# Patient Record
Sex: Female | Born: 1960 | Race: White | Hispanic: No | Smoking: Never smoker
Health system: Southern US, Community
[De-identification: ages and names within clinical notes are randomized; demographics above are authoritative.]

## PROBLEM LIST (undated history)

## (undated) DIAGNOSIS — I10 Essential (primary) hypertension: Secondary | ICD-10-CM

## (undated) DIAGNOSIS — R600 Localized edema: Secondary | ICD-10-CM

## (undated) DIAGNOSIS — E785 Hyperlipidemia, unspecified: Secondary | ICD-10-CM

## (undated) DIAGNOSIS — E119 Type 2 diabetes mellitus without complications: Secondary | ICD-10-CM

## (undated) DIAGNOSIS — M1990 Unspecified osteoarthritis, unspecified site: Secondary | ICD-10-CM

## (undated) DIAGNOSIS — S36039A Unspecified laceration of spleen, initial encounter: Secondary | ICD-10-CM

## (undated) DIAGNOSIS — E039 Hypothyroidism, unspecified: Secondary | ICD-10-CM

## (undated) DIAGNOSIS — E8941 Symptomatic postprocedural ovarian failure: Secondary | ICD-10-CM

## (undated) DIAGNOSIS — K625 Hemorrhage of anus and rectum: Secondary | ICD-10-CM

## (undated) DIAGNOSIS — Z6836 Body mass index (BMI) 36.0-36.9, adult: Secondary | ICD-10-CM

## (undated) DIAGNOSIS — K760 Fatty (change of) liver, not elsewhere classified: Secondary | ICD-10-CM

## (undated) DIAGNOSIS — F419 Anxiety disorder, unspecified: Secondary | ICD-10-CM

## (undated) HISTORY — DX: Hyperlipidemia, unspecified: E78.5

## (undated) HISTORY — DX: Body mass index (bmi) 36.0-36.9, adult: Z68.36

## (undated) HISTORY — DX: Localized edema: R60.0

## (undated) HISTORY — DX: Fatty (change of) liver, not elsewhere classified: K76.0

## (undated) HISTORY — DX: Type 2 diabetes mellitus without complications: E11.9

## (undated) HISTORY — DX: Unspecified osteoarthritis, unspecified site: M19.90

## (undated) HISTORY — DX: Morbid (severe) obesity due to excess calories: E66.01

## (undated) HISTORY — DX: Symptomatic postprocedural ovarian failure: E89.41

## (undated) HISTORY — DX: Essential (primary) hypertension: I10

## (undated) HISTORY — PX: CERVICAL SPINE SURGERY: SHX589

## (undated) HISTORY — DX: Anxiety disorder, unspecified: F41.9

## (undated) HISTORY — PX: CHOLECYSTECTOMY: SHX55

## (undated) HISTORY — DX: Unspecified laceration of spleen, initial encounter: S36.039A

## (undated) HISTORY — DX: Hemorrhage of anus and rectum: K62.5

## (undated) HISTORY — PX: JOINT REPLACEMENT: SHX530

---

## 1998-07-01 ENCOUNTER — Other Ambulatory Visit: Admission: RE | Admit: 1998-07-01 | Discharge: 1998-07-01 | Payer: Self-pay | Admitting: Family Medicine

## 2000-04-23 ENCOUNTER — Other Ambulatory Visit: Admission: RE | Admit: 2000-04-23 | Discharge: 2000-04-23 | Payer: Self-pay | Admitting: Obstetrics and Gynecology

## 2002-09-21 ENCOUNTER — Encounter: Payer: Self-pay | Admitting: Obstetrics and Gynecology

## 2002-09-21 ENCOUNTER — Ambulatory Visit (HOSPITAL_COMMUNITY): Admission: RE | Admit: 2002-09-21 | Discharge: 2002-09-21 | Payer: Self-pay | Admitting: Obstetrics and Gynecology

## 2004-04-24 ENCOUNTER — Ambulatory Visit: Payer: Self-pay | Admitting: Family Medicine

## 2004-08-21 ENCOUNTER — Ambulatory Visit: Payer: Self-pay | Admitting: Family Medicine

## 2005-01-02 ENCOUNTER — Ambulatory Visit: Payer: Self-pay | Admitting: Family Medicine

## 2005-01-23 ENCOUNTER — Ambulatory Visit: Payer: Self-pay | Admitting: Family Medicine

## 2005-08-16 ENCOUNTER — Ambulatory Visit: Payer: Self-pay | Admitting: Family Medicine

## 2005-08-22 ENCOUNTER — Ambulatory Visit: Payer: Self-pay | Admitting: Family Medicine

## 2005-11-26 ENCOUNTER — Ambulatory Visit: Payer: Self-pay | Admitting: Family Medicine

## 2006-04-09 ENCOUNTER — Ambulatory Visit: Payer: Self-pay | Admitting: Family Medicine

## 2006-04-09 LAB — CONVERTED CEMR LAB
ALT: 11 units/L (ref 0–40)
AST: 19 units/L (ref 0–37)
Albumin: 3.8 g/dL (ref 3.5–5.2)
Alkaline Phosphatase: 36 units/L — ABNORMAL LOW (ref 39–117)
BUN: 13 mg/dL (ref 6–23)
Basophils Absolute: 0.1 10*3/uL (ref 0.0–0.1)
Basophils Relative: 1.6 % — ABNORMAL HIGH (ref 0.0–1.0)
CO2: 29 meq/L (ref 19–32)
Calcium: 9.2 mg/dL (ref 8.4–10.5)
Chloride: 102 meq/L (ref 96–112)
Chol/HDL Ratio, serum: 9
Cholesterol: 265 mg/dL (ref 0–200)
Creatinine, Ser: 0.9 mg/dL (ref 0.4–1.2)
Eosinophil percent: 3.9 % (ref 0.0–5.0)
GFR calc non Af Amer: 72 mL/min
Glomerular Filtration Rate, Af Am: 87 mL/min/{1.73_m2}
Glucose, Bld: 87 mg/dL (ref 70–99)
HCT: 38.1 % (ref 36.0–46.0)
HDL: 29.3 mg/dL — ABNORMAL LOW (ref >39.0)
Hemoglobin: 12.5 g/dL (ref 12.0–15.0)
LDL DIRECT: 174.8 mg/dL
Lymphocytes Relative: 21.8 % (ref 12.0–46.0)
MCHC: 32.8 g/dL (ref 30.0–36.0)
MCV: 86.8 fL (ref 78.0–100.0)
Monocytes Absolute: 0.4 10*3/uL (ref 0.2–0.7)
Monocytes Relative: 5.8 % (ref 3.0–11.0)
Neutro Abs: 4.9 10*3/uL (ref 1.4–7.7)
Neutrophils Relative %: 66.9 % (ref 43.0–77.0)
Platelets: 258 10*3/uL (ref 150–400)
Potassium: 3.8 meq/L (ref 3.5–5.1)
RBC: 4.39 M/uL (ref 3.87–5.11)
RDW: 16.4 % — ABNORMAL HIGH (ref 11.5–14.6)
Sodium: 137 meq/L (ref 135–145)
Total Bilirubin: 0.9 mg/dL (ref 0.3–1.2)
Total Protein: 6.7 g/dL (ref 6.0–8.3)
Triglyceride fasting, serum: 224 mg/dL (ref 0–149)
VLDL: 45 mg/dL — ABNORMAL HIGH (ref 0–40)
WBC: 7.3 10*3/uL (ref 4.5–10.5)

## 2006-05-28 HISTORY — PX: ABDOMINAL HYSTERECTOMY: SHX81

## 2006-05-28 HISTORY — PX: OOPHORECTOMY: SHX86

## 2006-06-11 ENCOUNTER — Ambulatory Visit: Payer: Self-pay | Admitting: Family Medicine

## 2006-06-11 LAB — CONVERTED CEMR LAB
ALT: 11 units/L (ref 0–40)
AST: 20 units/L (ref 0–37)
Albumin: 4.3 g/dL (ref 3.5–5.2)
Alkaline Phosphatase: 41 units/L (ref 39–117)
Amylase: 46 units/L (ref 27–131)
BUN: 11 mg/dL (ref 6–23)
Basophils Absolute: 0.1 10*3/uL (ref 0.0–0.1)
Basophils Relative: 1.4 % — ABNORMAL HIGH (ref 0.0–1.0)
CO2: 28 meq/L (ref 19–32)
Calcium: 9.7 mg/dL (ref 8.4–10.5)
Chlamydia, DNA Probe: NEGATIVE
Chloride: 100 meq/L (ref 96–112)
Creatinine, Ser: 1 mg/dL (ref 0.4–1.2)
Eosinophils Relative: 3.2 % (ref 0.0–5.0)
GC Probe Amp, Genital: NEGATIVE
GFR calc Af Amer: 77 mL/min
GFR calc non Af Amer: 64 mL/min
Glucose, Bld: 90 mg/dL (ref 70–99)
HCT: 37.9 % (ref 36.0–46.0)
Hemoglobin: 12.7 g/dL (ref 12.0–15.0)
Lipase: 26 units/L (ref 11.0–59.0)
Lymphocytes Relative: 21.3 % (ref 12.0–46.0)
MCHC: 33.4 g/dL (ref 30.0–36.0)
MCV: 87.3 fL (ref 78.0–100.0)
Monocytes Absolute: 0.2 10*3/uL (ref 0.2–0.7)
Monocytes Relative: 3.2 % (ref 3.0–11.0)
Neutro Abs: 5.2 10*3/uL (ref 1.4–7.7)
Neutrophils Relative %: 70.9 % (ref 43.0–77.0)
Platelets: 315 10*3/uL (ref 150–400)
Potassium: 3.8 meq/L (ref 3.5–5.1)
RBC: 4.34 M/uL (ref 3.87–5.11)
RDW: 16 % — ABNORMAL HIGH (ref 11.5–14.6)
Sodium: 137 meq/L (ref 135–145)
Total Bilirubin: 1.2 mg/dL (ref 0.3–1.2)
Total Protein: 7 g/dL (ref 6.0–8.3)
WBC: 7.3 10*3/uL (ref 4.5–10.5)

## 2006-06-12 ENCOUNTER — Encounter: Admission: RE | Admit: 2006-06-12 | Discharge: 2006-06-12 | Payer: Self-pay | Admitting: Family Medicine

## 2006-07-02 ENCOUNTER — Ambulatory Visit: Payer: Self-pay | Admitting: Family Medicine

## 2006-07-10 ENCOUNTER — Inpatient Hospital Stay (HOSPITAL_COMMUNITY): Admission: RE | Admit: 2006-07-10 | Discharge: 2006-07-12 | Payer: Self-pay | Admitting: Obstetrics and Gynecology

## 2006-07-10 ENCOUNTER — Encounter (INDEPENDENT_AMBULATORY_CARE_PROVIDER_SITE_OTHER): Payer: Self-pay | Admitting: Specialist

## 2006-09-17 DIAGNOSIS — E785 Hyperlipidemia, unspecified: Secondary | ICD-10-CM | POA: Insufficient documentation

## 2006-09-17 DIAGNOSIS — J309 Allergic rhinitis, unspecified: Secondary | ICD-10-CM | POA: Insufficient documentation

## 2006-09-17 DIAGNOSIS — I10 Essential (primary) hypertension: Secondary | ICD-10-CM | POA: Insufficient documentation

## 2007-06-03 ENCOUNTER — Telehealth: Payer: Self-pay | Admitting: Internal Medicine

## 2007-06-13 ENCOUNTER — Ambulatory Visit: Payer: Self-pay | Admitting: Internal Medicine

## 2007-06-13 DIAGNOSIS — E8941 Symptomatic postprocedural ovarian failure: Secondary | ICD-10-CM

## 2007-06-13 DIAGNOSIS — R21 Rash and other nonspecific skin eruption: Secondary | ICD-10-CM | POA: Insufficient documentation

## 2007-06-13 DIAGNOSIS — F341 Dysthymic disorder: Secondary | ICD-10-CM | POA: Insufficient documentation

## 2007-06-13 HISTORY — DX: Symptomatic postprocedural ovarian failure: E89.41

## 2007-07-01 ENCOUNTER — Telehealth (INDEPENDENT_AMBULATORY_CARE_PROVIDER_SITE_OTHER): Payer: Self-pay | Admitting: *Deleted

## 2007-07-07 ENCOUNTER — Ambulatory Visit: Payer: Self-pay | Admitting: Family Medicine

## 2007-07-07 DIAGNOSIS — H60399 Other infective otitis externa, unspecified ear: Secondary | ICD-10-CM | POA: Insufficient documentation

## 2007-08-04 ENCOUNTER — Ambulatory Visit: Payer: Self-pay | Admitting: Family Medicine

## 2007-08-08 ENCOUNTER — Telehealth: Payer: Self-pay | Admitting: Family Medicine

## 2007-08-13 LAB — CONVERTED CEMR LAB
ALT: 13 units/L (ref 0–35)
AST: 16 units/L (ref 0–37)
Albumin: 4.1 g/dL (ref 3.5–5.2)
Alkaline Phosphatase: 53 units/L (ref 39–117)
BUN: 15 mg/dL (ref 6–23)
Bilirubin, Direct: 0.1 mg/dL (ref 0.0–0.3)
CO2: 29 meq/L (ref 19–32)
Calcium: 9.4 mg/dL (ref 8.4–10.5)
Chloride: 107 meq/L (ref 96–112)
Cholesterol: 238 mg/dL (ref 0–200)
Creatinine, Ser: 0.9 mg/dL (ref 0.4–1.2)
Direct LDL: 164 mg/dL
GFR calc Af Amer: 87 mL/min
GFR calc non Af Amer: 72 mL/min
Glucose, Bld: 89 mg/dL (ref 70–99)
HDL: 31.7 mg/dL — ABNORMAL LOW (ref >39.0)
Potassium: 4.1 meq/L (ref 3.5–5.1)
Sodium: 142 meq/L (ref 135–145)
Total Bilirubin: 0.7 mg/dL (ref 0.3–1.2)
Total CHOL/HDL Ratio: 7.5
Total Protein: 6.8 g/dL (ref 6.0–8.3)
Triglycerides: 185 mg/dL — ABNORMAL HIGH (ref 0–149)
VLDL: 37 mg/dL (ref 0–40)

## 2007-08-14 ENCOUNTER — Encounter (INDEPENDENT_AMBULATORY_CARE_PROVIDER_SITE_OTHER): Payer: Self-pay | Admitting: *Deleted

## 2007-11-19 ENCOUNTER — Telehealth (INDEPENDENT_AMBULATORY_CARE_PROVIDER_SITE_OTHER): Payer: Self-pay | Admitting: *Deleted

## 2007-11-19 DIAGNOSIS — M79609 Pain in unspecified limb: Secondary | ICD-10-CM | POA: Insufficient documentation

## 2007-11-21 ENCOUNTER — Emergency Department (HOSPITAL_COMMUNITY): Admission: EM | Admit: 2007-11-21 | Discharge: 2007-11-21 | Payer: Self-pay | Admitting: Emergency Medicine

## 2007-11-21 ENCOUNTER — Telehealth: Payer: Self-pay | Admitting: Family Medicine

## 2007-11-28 ENCOUNTER — Ambulatory Visit: Payer: Self-pay | Admitting: Family Medicine

## 2007-11-28 DIAGNOSIS — J019 Acute sinusitis, unspecified: Secondary | ICD-10-CM | POA: Insufficient documentation

## 2008-01-01 ENCOUNTER — Ambulatory Visit: Payer: Self-pay | Admitting: Cardiovascular Disease

## 2008-01-21 ENCOUNTER — Ambulatory Visit: Payer: Self-pay

## 2008-03-03 ENCOUNTER — Ambulatory Visit: Payer: Self-pay | Admitting: Cardiovascular Disease

## 2008-09-29 ENCOUNTER — Telehealth (INDEPENDENT_AMBULATORY_CARE_PROVIDER_SITE_OTHER): Payer: Self-pay | Admitting: *Deleted

## 2008-11-01 ENCOUNTER — Encounter (INDEPENDENT_AMBULATORY_CARE_PROVIDER_SITE_OTHER): Payer: Self-pay | Admitting: *Deleted

## 2008-12-06 ENCOUNTER — Telehealth: Payer: Self-pay | Admitting: Family Medicine

## 2008-12-17 ENCOUNTER — Encounter (INDEPENDENT_AMBULATORY_CARE_PROVIDER_SITE_OTHER): Payer: Self-pay | Admitting: *Deleted

## 2010-06-29 NOTE — Assessment & Plan Note (Signed)
Summary: 2-3 wks ov//ph   Vital Signs:  Patient Profile:   50 Years Old Female Weight:      218.0 pounds Pulse rate:   72 / minute BP sitting:   138 / 92  (left arm) Cuff size:   large  Vitals Entered By: Shonna Chock (August 04, 2007 8:11 AM)                 Serial Vital Signs/Assessments:  Time      Position  BP       Pulse  Resp  Temp     By 8:37 AM             130/82                         Loreen Freud DO   PCP:  Laury Axon  Chief Complaint:  2-3 WEEK FOLLOW-UP ON B/P AND LABS .  History of Present Illness:  Hypertension Follow-Up      This is a 50 year old woman who presents for Hypertension follow-up.  The patient reports headaches.  The patient denies the following associated symptoms: chest pain, chest pressure, exercise intolerance, dyspnea, palpitations, syncope, leg edema, and pedal edema.  Compliance with medications (by patient report) has been near 100%.  The patient reports that dietary compliance has been poor.  The patient reports exercising 3-4X per week.  Adjunctive measures currently used by the patient include salt restriction.     Hyperlipidemia Follow-Up      The patient also presents for Hyperlipidemia follow-up.  The patient denies muscle aches, GI upset, abdominal pain, flushing, itching, constipation, diarrhea, and fatigue.  The patient denies the following symptoms: chest pain/pressure, exercise intolerance, dypsnea, palpitations, syncope, and pedal edema.  Compliance with medications (by patient report) has been near 100%.  Dietary compliance has been poor.  The patient reports exercising 3-4X per week.      Current Allergies (reviewed today): ! CODEINE ! NIACIN  Past Medical History:    Reviewed history from 09/17/2006 and no changes required:       Allergic rhinitis       Hyperlipidemia       Hypertension  Past Surgical History:    Reviewed history from 06/13/2007 and no changes required:       Cholecystectomy       RUPTURED DISK:  C-SPINE       Hysterectomy 2008       Oophorectomy 2008   Family History:    Reviewed history from 11/11/2006 and no changes required:       Family History Diabetes 1st degree relative       Family History Hypertension       Family History Other cancer-Pancreatic       Family History of Cardiovascular disorder  Social History:    Reviewed history from 06/13/2007 and no changes required:       Single mom       Divorced       Alcohol use-yes       Regular exercise-no       lost job recently    Review of Systems      See HPI   Physical Exam  General:     Well-developed,well-nourished,in no acute distress; alert,appropriate and cooperative throughout examination Neck:     No deformities, masses, or tenderness noted. Lungs:     Normal respiratory effort, chest expands symmetrically. Lungs are clear to auscultation, no  crackles or wheezes. Heart:     normal rate, regular rhythm, and no murmur.   Extremities:     No clubbing, cyanosis, edema, or deformity noted with normal full range of motion of all joints.   Skin:     Intact without suspicious lesions or rashes Psych:     Cognition and judgment appear intact. Alert and cooperative with normal attention span and concentration. No apparent delusions, illusions, hallucinations    Impression & Recommendations:  Problem # 1:  HYPERTENSION (ICD-401.9)  Her updated medication list for this problem includes:    Benazepril Hcl 40 Mg Tabs (Benazepril hcl) .Marland Kitchen... 1 by mouth once daily    Hydrochlorothiazide 12.5 Mg Caps (Hydrochlorothiazide) .Marland Kitchen... 1 by mouth once daily  Orders: Venipuncture (04540) TLB-Lipid Panel (80061-LIPID) TLB-BMP (Basic Metabolic Panel-BMET) (80048-METABOL) TLB-Hepatic/Liver Function Pnl (80076-HEPATIC)  BP today: 138/92 Prior BP: 130/90 (07/07/2007)  Labs Reviewed: Creat: 1.0 (06/11/2006) Chol: 265 (04/09/2006)   HDL: 29.3 (04/09/2006)   LDL: DEL (04/09/2006)   TG: 224 (04/09/2006)   Problem #  2:  HYPERLIPIDEMIA (ICD-272.4)  Orders: Venipuncture (98119) TLB-Lipid Panel (80061-LIPID) TLB-BMP (Basic Metabolic Panel-BMET) (80048-METABOL) TLB-Hepatic/Liver Function Pnl (80076-HEPATIC)  Labs Reviewed: Chol: 265 (04/09/2006)   HDL: 29.3 (04/09/2006)   LDL: DEL (04/09/2006)   TG: 224 (04/09/2006) SGOT: 20 (06/11/2006)   SGPT: 11 (06/11/2006)   Complete Medication List: 1)  Benazepril Hcl 40 Mg Tabs (Benazepril hcl) .Marland Kitchen.. 1 by mouth once daily 2)  Celexa 20 Mg Tabs (Citalopram hydrobromide) .... 1/2 a day x 5 days, then 1 by mouth once daily 3)  Hydrochlorothiazide 12.5 Mg Caps (Hydrochlorothiazide) .Marland Kitchen.. 1 by mouth once daily     Prescriptions: HYDROCHLOROTHIAZIDE 12.5 MG  CAPS (HYDROCHLOROTHIAZIDE) 1 by mouth once daily  #30 x 1   Entered and Authorized by:   Loreen Freud DO   Signed by:   Loreen Freud DO on 08/04/2007   Method used:   Electronically sent to ...       [facility] [address]       [address]       [place], [place]  [address]       Ph: [phone]       Fax: [phone]   RxID:   [id number]  ]

## 2010-06-29 NOTE — Progress Notes (Signed)
Summary: Connie [name] FYI NO INSURANCE  Phone Note Call from Patient   Caller: Patient Summary of Call: PT RETURNING CALL IN REGARDS TO OV AND LABS DUE NOW. PT STATE THAT SHE IS CURRENTLY UNEMPLOYEED AND HAS NO INSURANCE AS OF RIGHT NOW. PT STATES THAT SHE IS ALREADY OWES [employer] ALOT OF MONEY AND DOES NOT WANT TO GET FURTHER BEHIND. PT STATES THAT SHE IS CURRENTLY WORKING ON GET SOME INSURANCE AND IF EVERYTHING GO AS PLAN HOPEFULLY SHE WILL HAVE INSURANCE IN 3 MONTH.PT ALSO WANT TO THANK YOU FOR THE REMINDER ABOUT OV AND LABS BUT AT THIS TIME SHE IS UNABLE TO SCHEDULE A APPT...................Marland KitchenFelecia Deloach CMA  December 06, 2008 12:37 PM

## 2010-06-29 NOTE — Letter (Signed)
Summary: Results Follow up Letter   at [facility]  [address] [place], [place] [address]   Phone: [phone]  Fax: [phone]    08/14/2007 MRN: [id number]  Connie [name] [address] [place], [place]  [address]  Dear Ms. Ricard,  The following are the results of your recent test(s):  Test         Result    Pap Smear:        Normal _____  Not Normal _____ Comments: ______________________________________________________ Cholesterol: LDL(Bad cholesterol):         Your goal is less than:         HDL (Good cholesterol):       Your goal is more than: Comments:  ______________________________________________________ Mammogram:        Normal _____  Not Normal _____ Comments:  ___________________________________________________________________ Hemoccult:        Normal _____  Not normal _______ Comments:    _____________________________________________________________________ Other Tests:  PLEASE SEE ATTACHED LABS AND COMMENTS  We routinely do not discuss normal results over the telephone.  If you desire a copy of the results, or you have any questions about this information we can discuss them at your next office visit.   Sincerely,

## 2010-06-29 NOTE — Progress Notes (Signed)
Summary: [facility] CANCELLED PSYCHOLOGY APPT-fyi  Phone Note From Other Clinic   Caller: JENNIFER Call For: [facility] Summary of Call: PT CANCELLED HER APPT FOR 03.25.09 DUE TO FINANCES.  SHE SAID SHE WOULD TRY TO GO THROUGH MENTAL HEALTH ACCORDING TO JENNIFER AT [facility]. Initial call taken by: Gwen Pounds,  August 08, 2007 11:06 AM

## 2010-06-29 NOTE — Progress Notes (Signed)
Summary: med refill for something cheaper-hop please see  Phone Note Call from Patient Call back at Home Phone [phone]   Summary of Call: dr. Verlan Friends [phone] is the store number and its on [address] in [place]   [phone] pt just lost her job on 12.3.09 and she went to go get her rx of  MICARDIS HCT at the pharmacy and the medication was 93 dollars at [facility]. she wanted to know if there was a medication she could take for high blood pressure and a water pill that was cheaper. and instead of having both of the hp and water pill together have one for each.  Initial call taken by: Charolette Child,  June 03, 2007 10:01 AM  Follow-up for Phone Call        left message on machine sorry but she will need to schedule office visit dr Laury Axon is out of office so will try to see if dr will be ok changing med but more than likely she will have to schedule ov Follow-up by: Doristine Devoid,  June 04, 2007 9:33 AM  Additional Follow-up for Phone Call Additional follow up Details #1::        spoke with pt aware needs office visit so scheduled for 1.23.09 to dr Laury Axon in the mean time pt has been out of meds for a few days needs something cheaper and similair to micardis hct will forward to hop Additional Follow-up by: Doristine Devoid,  June 05, 2007 11:53 AM    Additional Follow-up for Phone Call Additional follow up Details #2::    Benazepril 20 mg #30 , costs $4 @ [facility] or [facility] IF INSURANCE NOT USED; F/U with Dr Laury Axon in 3-4 weeks Follow-up by: Marga Melnick MD,  June 05, 2007 11:53 PM  Additional Follow-up for Phone Call Additional follow up Details #3:: Details for Additional Follow-up Action Taken: s/w pt informed dr hopper did a rx for benazepril and rx [facility] [place] informed f/u with dr Laury Axon in 3-4 weeks...................................................................Marland KitchenKandice Hams  June 06, 2007 11:24 AM  Additional  Follow-up by: Kandice Hams,  June 06, 2007 11:24 AM  New/Updated Medications: BENAZEPRIL HCL 20 MG  TABS (BENAZEPRIL HCL) 1 by mouth once daily   Prescriptions: BENAZEPRIL HCL 20 MG  TABS (BENAZEPRIL HCL) 1 by mouth once daily  #30 x 0   Entered by:   Kandice Hams   Authorized by:   Marga Melnick MD   Signed by:   Kandice Hams on 06/06/2007   Method used:   Electronically sent to ...       [facility]       [address]       [place], [place]  [address]       Ph: [phone]       Fax: [phone]   RxID:   [id number]

## 2010-06-29 NOTE — Assessment & Plan Note (Signed)
Summary: earache.cbs   Vital Signs:  Patient Profile:   50 Years Old Female Weight:      218 pounds Temp:     98.5 degrees F oral BP sitting:   124 / 82  (left arm) Cuff size:   large  Vitals Entered By: Alfred Levins, CMA (November 28, 2007 10:14 AM)                 PCP:  Laury Axon  Chief Complaint:  both earaches and sinus pressure.  History of Present Illness: 2 days of sinus pressure, PND, ST, and bilateral ear pains. On Claritin.    Current Allergies (reviewed today): ! CODEINE ! NIACIN  Past Medical History:    Reviewed history from 09/17/2006 and no changes required:       Allergic rhinitis       Hyperlipidemia       Hypertension     Review of Systems      See HPI   Physical Exam  General:     Well-developed,well-nourished,in no acute distress; alert,appropriate and cooperative throughout examination Head:     Normocephalic and atraumatic without obvious abnormalities. No apparent alopecia or balding. Eyes:     No corneal or conjunctival inflammation noted. EOMI. Perrla. Funduscopic exam benign, without hemorrhages, exudates or papilledema. Vision grossly normal. Ears:     External ear exam shows no significant lesions or deformities.  Otoscopic examination reveals clear canals, tympanic membranes are intact bilaterally without bulging, retraction, inflammation or discharge. Hearing is grossly normal bilaterally. Nose:     External nasal examination shows no deformity or inflammation. Nasal mucosa are pink and moist without lesions or exudates. Mouth:     Oral mucosa and oropharynx without lesions or exudates.  Teeth in good repair. Neck:     scattered tender shotty AC nodes Lungs:     Normal respiratory effort, chest expands symmetrically. Lungs are clear to auscultation, no crackles or wheezes.    Impression & Recommendations:  Problem # 1:  ACUTE SINUSITIS, UNSPECIFIED (ICD-461.9)  Her updated medication list for this problem includes:  Zithromax Z-pak 250 Mg Tabs (Azithromycin) .Marland Kitchen... As directed   Complete Medication List: 1)  Benazepril Hcl 40 Mg Tabs (Benazepril hcl) .Marland Kitchen.. 1 by mouth once daily 2)  Celexa 20 Mg Tabs (Citalopram hydrobromide) .... 1/2 a day x 5 days, then 1 by mouth once daily 3)  Hydrochlorothiazide 12.5 Mg Caps (Hydrochlorothiazide) .Marland Kitchen.. 1 by mouth once daily 4)  Zithromax Z-pak 250 Mg Tabs (Azithromycin) .... As directed   Patient Instructions: 1)  Add Mucinex. 2)  Please schedule a follow-up appointment as needed.   Prescriptions: ZITHROMAX Z-PAK 250 MG  TABS (AZITHROMYCIN) as directed  #1 x 0   Entered and Authorized by:   Nelwyn Salisbury MD   Signed by:   Nelwyn Salisbury MD on 11/28/2007   Method used:   Electronically sent to ...       [facility]       [address]       [place], [place]  [address]       Ph: [phone]       Fax: [phone]   RxID:   [id number]  ]

## 2010-06-29 NOTE — Assessment & Plan Note (Signed)
Summary: rash on leg--acute only//tl   Vital Signs:  Patient Profile:   50 Years Old Female Weight:      219.6 pounds Temp:     98.2 degrees F oral BP sitting:   132 / 80  Vitals Entered By: Shary Decamp (June 13, 2007 10:31 AM)                 Chief Complaint:  rt leg red/swollen off & on x 11/08; using clobetasol propionate oint; pt very emotional/tearful wants to discuss to depression - pt does not have insurance.  History of Present Illness: rt leg red/swollen off & on x 11/08 + pruritus and swelling  usually at the end of the day saw derm before: stasis dermatitis  using clobetasol propionate oint and moisturizers (+ help) rash less intense now   pt very emotional/tearful wants to discuss to depression - pt does not have insurance lost her  job 12-08 lost house bankrupt  Current Allergies (reviewed today): ! CODEINE ! NIACIN  Past Medical History:    Reviewed history from 09/17/2006 and no changes required:       Allergic rhinitis       Hyperlipidemia       Hypertension  Past Surgical History:    Reviewed history from 09/17/2006 and no changes required:       Cholecystectomy       RUPTURED DISK: C-SPINE       Hysterectomy 2008       Oophorectomy 2008   Social History:    Single mom    Divorced    Alcohol use-yes    Regular exercise-no    lost job recently    Review of Systems       other than below, ROS  is negative   Psych      Complains of anxiety.      Denies suicidal thoughts/plans.      not sleeping well   Physical Exam  General:     alert and well-developed.   Skin:     slt redness w/o warmness at R pretibial area slt pitting edema Psych:     Oriented X3, memory intact for recent and remote, and good eye contact.   Tearful, depress> anxious appearing    Impression & Recommendations:  Problem # 1:  RASH-NONVESICULAR (ICD-782.1) stasis dermatitis most likel;y Erysipela? Keflex, elevate legs call if worse  Problem #  2:  MENOPAUSE, SURGICAL (ICD-627.4) not on HRT occ hot flashes please discuss HRT when she come back and see her PCP  Problem # 3:  ANXIETY DEPRESSION (ICD-300.4) counseled Start Celexa s/e discussed  Complete Medication List: 1)  Benazepril Hcl 20 Mg Tabs (Benazepril hcl) .Marland Kitchen.. 1 by mouth once daily 2)  Celexa 20 Mg Tabs (Citalopram hydrobromide) .... 1/2 a day x 5 days, then 1 by mouth once daily 3)  Keflex 500 Mg Caps (Cephalexin) .Marland Kitchen.. 1 by mouth qid   Patient Instructions: 1)  see Dr. Laury Axon in 2 or 3 weeks 2)  call if symptoms worsen    Prescriptions: CELEXA 20 MG  TABS (CITALOPRAM HYDROBROMIDE) 1/2 a day x 5 days, then 1 by mouth once daily  #30 x 0   Entered and Authorized by:   Nolon Rod. Paz MD   Signed by:   Nolon Rod. Paz MD on 06/13/2007   Method used:   Reprint   RxID:   [id number] BENAZEPRIL HCL 20 MG  TABS (BENAZEPRIL HCL) 1 by mouth once daily  #  30 x 0   Entered and Authorized by:   Nolon Rod. Paz MD   Signed by:   Nolon Rod. Paz MD on 06/13/2007   Method used:   Print then Give to Patient   RxID:   [id number] KEFLEX 500 MG  CAPS (CEPHALEXIN) 1 by mouth qid  #28 x 0   Entered and Authorized by:   Nolon Rod. Paz MD   Signed by:   Nolon Rod. Paz MD on 06/13/2007   Method used:   Print then Give to Patient   RxID:   [id number] CELEXA 20 MG  TABS (CITALOPRAM HYDROBROMIDE) 1/2 a day x 5 days, then 1 by mouth once daily  #30 x 0   Entered and Authorized by:   Nolon Rod. Paz MD   Signed by:   Nolon Rod. Paz MD on 06/13/2007   Method used:   Electronically sent to ...       [facility]       [address]       [place], [place]  [address]       Ph: [phone]       Fax: [phone]   RxID:   [id number]  ]

## 2010-06-29 NOTE — Progress Notes (Signed)
Summary: [name]  Phone Note Refill Request   Refills Requested: Medication #1:  HYDROCHLOROTHIAZIDE 25 MG  TABS Take one-half pill daily.Nicolette Bang [place] [facility]--[phone]  Initial call taken by: Freddy Jaksch,  Sep 29, 2008 9:28 AM    New/Updated Medications: HYDROCHLOROTHIAZIDE 25 MG  TABS (HYDROCHLOROTHIAZIDE) Take one-half pill daily.pt due for office visit   Prescriptions: HYDROCHLOROTHIAZIDE 25 MG  TABS (HYDROCHLOROTHIAZIDE) Take one-half pill daily.pt due for office visit  #30 x 0   Entered by:   Jeremy Johann CMA   Authorized by:   Loreen Freud DO   Signed by:   Jeremy Johann CMA on 09/29/2008   Method used:   Electronically to        [facility] [address] ([facility]ail)       [address], [address]       [place], [place]  [address]       Ph: [phone]       Fax: [phone]   RxID:   [id number]

## 2010-06-29 NOTE — Progress Notes (Signed)
Summary: leg pain - dr Laury Axon  Phone Note Call from Patient Call back at Home Phone [phone]   Caller: Patient Summary of Call: patient is waiting appt re leg - pain is worse - back of leg is hot ,red & tender --- itching causes leg to get red - she cant put pressure on it Initial call taken by: Okey Regal [place],  November 21, 2007 11:24 AM  Follow-up for Phone Call        Patient aware that vascular will call her with appt. that all notes have been sent and if her pain is worse, she needs to go to the ED and patient agreed.  Ardyth Man  November 21, 2007 11:40 AM Also, sending note to Elmarie Shiley to see if vascular lab can get her in any sooner.  Ardyth Man  November 21, 2007 11:41 AM  Follow-up by: Ardyth Man,  November 21, 2007 11:40 AM  Additional Follow-up for Phone Call Additional follow up Details #1::        Spoke our cardiology office and dr. Earmon Phoenix nurse Leotis Shames is off today. I left a message for another nurse to see if they could get the patient is sooner.  Additional Follow-up by: Charolette Child,  November 21, 2007 12:20 PM    Additional Follow-up for Phone Call Additional follow up Details #2::    Thank you.  Ardyth Man  November 21, 2007 12:38 PM

## 2010-06-29 NOTE — Progress Notes (Signed)
Summary: referral  Phone Note Call from Patient Call back at Home Phone [phone] Call back at [phone]   Caller: Patient Summary of Call: patient calling wanting to get referral for ongoing issue with legs first started with right leg 1/09 now having same is with left leg would like to be referred for vascular evaluation Initial call taken by: Doristine Devoid,  November 19, 2007 10:29 AM  Follow-up for Phone Call        Dr. Laury Axon is this okay with you ?  Ardyth Man  November 19, 2007 10:31 AM  Follow-up by: Ardyth Man,  November 19, 2007 10:31 AM  Additional Follow-up for Phone Call Additional follow up Details #1::        ok that's fine---  put pt requesting it  Additional Follow-up by: Loreen Freud DO,  November 19, 2007 11:28 AM  New Problems: LEG PAIN, RIGHT (ICD-729.5) LEG PAIN, LEFT (ICD-729.5)   Additional Follow-up for Phone Call Additional follow up Details #2::    patient aware and will wait for pcc to give her a call Ardyth Man  November 19, 2007 12:34 PM  Follow-up by: Ardyth Man,  November 19, 2007 12:34 PM  New Problems: LEG PAIN, RIGHT (ICD-729.5) LEG PAIN, LEFT (ICD-729.5)

## 2010-06-29 NOTE — Letter (Signed)
Summary: Primary Care Appointment Letter  [facility]  [address] [place], [place] [address]   Phone: [phone]  Fax: [phone]    11/01/2008 MRN: [id number]  KELSAY HAGGARD [address] [place], [place]  [address]  Dear Ms. Effie Shy,   Your Primary Care Physician Loreen Freud DO has indicated that:    ___X____it is time to schedule an appointment for office visit and labs before further refills can be given.    _______you missed your appointment on______ and need to call and          reschedule.    _______you need to have lab work done.    _______you need to schedule an appointment discuss lab or test results.    _______you need to call to reschedule your appointment that is                       scheduled on _________.     Please call our office as soon as possible. Our phone number is [phone]          _________. Please press option 1. Our office is open 8a-12noon and 1p-5p, Monday through Friday.     Thank you,    [facility]

## 2010-06-29 NOTE — Assessment & Plan Note (Signed)
Summary: ROA FOR BLOOD PRESSURE, CHECK RT LEG///SPH   Vital Signs:  Patient Profile:   50 Years Old Female Weight:      217 pounds Pulse rate:   80 / minute BP sitting:   130 / 90  (left arm)                 PCP:  [facility]  Chief Complaint:  ROA on BP recheck right leg.  History of Present Illness:  Hypertension Follow-Up      This is a 50 year old woman who presents for Hypertension follow-up.  The patient denies lightheadedness, urinary frequency, headaches, edema, impotence, rash, and fatigue.  The patient denies the following associated symptoms: chest pain, chest pressure, exercise intolerance, dyspnea, palpitations, syncope, leg edema, and pedal edema.  Compliance with medications (by patient report) has been near 100%.  The patient reports that dietary compliance has been good.  The patient reports exercising daily.  Adjunctive measures currently used by the patient include salt restriction.    Pt also c/o rash returning on R le--- Dr Drue Novel gave pt Keflex for 5 days and now resolving.   Hyperlipidemia Follow-Up      The patient also presents for Hyperlipidemia follow-up.  The patient denies muscle aches, GI upset, abdominal pain, flushing, itching, constipation, diarrhea, and fatigue.  The patient denies the following symptoms: chest pain/pressure, exercise intolerance, dypsnea, palpitations, syncope, and pedal edema.  Compliance with medications (by patient report) has been near 100%.  Dietary compliance has been good.  The patient reports exercising daily.    Current Allergies: ! CODEINE ! NIACIN  Past Medical History:    Reviewed history from 09/17/2006 and no changes required:       Allergic rhinitis       Hyperlipidemia       Hypertension     Review of Systems      See HPI   Physical Exam  General:     Well-developed,well-nourished,in no acute distress; alert,appropriate and cooperative throughout examination Ears:     R ear canal errythematous  Neck:  No deformities, masses, or tenderness noted.no carotid bruits.   Lungs:     Normal respiratory effort, chest expands symmetrically. Lungs are clear to auscultation, no crackles or wheezes. Heart:     normal rate, regular rhythm, and no murmur.   Extremities:     No clubbing, cyanosis, edema, or deformity noted with normal full range of motion of all joints.   Skin:     Intact without suspicious lesions or rashes Psych:     Cognition and judgment appear intact. Alert and cooperative with normal attention span and concentration. No apparent delusions, illusions, hallucinations    Impression & Recommendations:  Problem # 1:  ANXIETY DEPRESSION (ICD-300.4) celexa 20 mg once daily  Orders: Psychology Referral (Psychology)   Problem # 2:  HYPERTENSION (ICD-401.9) rto 2-3 weeks Her updated medication list for this problem includes:    Benazepril Hcl 40 Mg Tabs (Benazepril hcl) .Marland Kitchen... 1 by mouth once daily  BP today: 130/90 Prior BP: 132/80 (06/13/2007)  Labs Reviewed: Creat: 1.0 (06/11/2006) Chol: 265 (04/09/2006)   HDL: 29.3 (04/09/2006)   LDL: DEL (04/09/2006)   TG: 224 (04/09/2006)   Problem # 3:  HYPERLIPIDEMIA (ICD-272.4)  Labs Reviewed: Chol: 265 (04/09/2006)   HDL: 29.3 (04/09/2006)   LDL: DEL (04/09/2006)   TG: 224 (04/09/2006) SGOT: 20 (06/11/2006)   SGPT: 11 (06/11/2006)   Problem # 4:  OTITIS EXTERNA, RIGHT (ICD-380.10)  Her  updated medication list for this problem includes:    Floxin Otic Singles 0.3 % Soln (Ofloxacin) .Marland KitchenMarland KitchenMarland KitchenMarland Kitchen 10 gtts r ear once daily for 7-10 days Discussed symptomatic treatment and preventive measures.   Complete Medication List: 1)  Benazepril Hcl 40 Mg Tabs (Benazepril hcl) .Marland Kitchen.. 1 by mouth once daily 2)  Celexa 20 Mg Tabs (Citalopram hydrobromide) .... 1/2 a day x 5 days, then 1 by mouth once daily 3)  Keflex 500 Mg Caps (Cephalexin) .Marland Kitchen.. 1 by mouth qid 4)  Floxin Otic Singles 0.3 % Soln (Ofloxacin) .Marland Kitchen.. 10 gtts r ear once daily for 7-10  days   Patient Instructions: 1)  rto fasting labs--  401.9, 272.4   lipid, hep, bmp    2)  schedule cpe 3)  schedule bp check 2-3 weeks    Prescriptions: FLOXIN OTIC SINGLES 0.3 %  SOLN (OFLOXACIN) 10 gtts R ear once daily for 7-10 days  #10 days x 0   Entered and Authorized by:   Loreen Freud DO   Signed by:   Loreen Freud DO on 07/07/2007   Method used:   Electronically sent to ...       [facility] [address]       [address], [address]       [place], [place]  [address]       Ph: [phone]       Fax: [phone]   RxID:   [id number] CELEXA 20 MG  TABS (CITALOPRAM HYDROBROMIDE) 1/2 a day x 5 days, then 1 by mouth once daily  #30 x 5   Entered and Authorized by:   Loreen Freud DO   Signed by:   Loreen Freud DO on 07/07/2007   Method used:   Electronically sent to ...       [facility] [address]       [address]       [place], [place]  [address]       Ph: [phone]       Fax: [phone]   RxID:   [id number] BENAZEPRIL HCL 40 MG  TABS (BENAZEPRIL HCL) 1 by mouth once daily  #30 x 2   Entered and Authorized by:   Loreen Freud DO   Signed by:   Loreen Freud DO on 07/07/2007   Method used:   Electronically sent to ...       [facility] [address]       [address]       [place], [place]  [address]       Ph: [phone]       Fax: [phone]   RxID:   [id number]  ]

## 2010-06-29 NOTE — Progress Notes (Signed)
Summary: rf on keflex  Phone Note Outgoing Call   Summary of Call: Pt requested refill on Keflex - ok'd 5 days per Dr Drue Novel has return office visit with Dr Laury Axon 07/07/07 advised to keep her appt - office visit sooner if sxs worse rx was called to incorrect pharmacy - new rx called to [facility] [place] ..................................................................Marland KitchenShary Decamp  July 01, 2007 12:02 PM       Prescriptions: KEFLEX 500 MG  CAPS (CEPHALEXIN) 1 by mouth qid  #20 x 0   Entered by:   Shary Decamp   Authorized by:   Nolon Rod. Paz MD   Signed by:   Shary Decamp on 07/01/2007   Method used:   Electronically sent to ...       [facility] [address]       [address]       [place], [place]  [address]       Ph: [phone]       Fax: [phone]   RxID:   [id number]

## 2010-06-29 NOTE — Letter (Signed)
Summary: Primary Care Appointment Letter  [facility]  [address] [place], [place] [address]   Phone: [phone]  Fax: [phone]    12/17/2008 MRN: [id number]  [name] [address] [place], [place]  [address]  Dear Ms. Connie [name],   Your Primary Care Physician Connie Freud DO has indicated that:    _______it is time to schedule an appointment.    _______you missed your appointment on______ and need to call and          reschedule.    _______you need to have lab work done.    _______you need to schedule an appointment discuss lab or test results.    _______you need to call to reschedule your appointment that is                       scheduled on _________.     Please call our office as soon as possible. Our phone number is [phone]          _________. Please press option 1. Our office is open 8a-12noon and 1p-5p, Monday through Friday.     Thank you,    Zachary Primary Care Scheduler

## 2010-10-10 NOTE — Assessment & Plan Note (Signed)
[facility] OFFICE NOTE   Connie, [name]                      MRN:          [id number]  DATE:03/03/2008                            DOB:          04/28/1961    PRIMARY CARE PHYSICIAN:  Connie Perla, DO   REASON FOR VISIT:  Follow up of lower extremity swelling.   HISTORY OF PRESENT ILLNESS:  Ms. Connie [name] is a pleasant 50 year old  Caucasian female with a past medical history significant for  hypertension, hyperlipidemia, and lower extremity edema over the last 9  years.  She was initially seen in my office on January 01, 2008 for  further evaluation of her lower extremity edema.  She told me at that  time that on and off for the last 9 years she had dependent edema in her  lower extremities that improved at night when she was supine in bed.  She occasionally noticed a purplish or reddish discoloration of her  lower extremities when they were swollen.  There was bilateral  involvement of the lower extremities which was not c/w deep venous  thrombosis.  I elected to perform noninvasive Doppler studies of the  lower extrmity arteries and veins.  There was no evidence of deep or  superficial vein thrombosis.  There was also no evidence of obstructive  disease in the arterial system of the lower extremities.  She comes in  today for a followup visit and states that her lower extremity swelling  has been much improved.  She has noticed redness over her right shin  that occurred about 1 week ago.  It has slightly increased in warmth and  is tender when she touches it.  The right lower extremity seems to be  slightly more swollen than the left over the last week.  She has no  other complaints at this time.  She tells me that she has gotten a part  time job and is enrolled full-time in college.  She denies any chest  discomfort, shortness of breath, palpitations, orthopnea, PND, or  diaphoresis.  She does tell me  that occasionally she feels dizzy, but  this is a minor problem from her perspective.   CURRENT MEDICATIONS:  1. Hydrochlorothiazide 25 mg once daily.  2. Benazepril 40 mg once daily.  3. Flax seed oil 1200 mg once daily.  4. B6 vitamin once daily.  5. Vitamin C once.  6. Enecisa 2 tablets once daily.  7. Aspirin 81 mg 2 tablets once daily.   PHYSICAL EXAMINATION:  GENERAL:  She is a pleasant overweight Caucasian  female in no acute distress.  VITAL SIGNS:  Her blood pressure is 120/79, pulse is 83 and regular,  respirations of 12 and nonlabored.  NECK:  No JVD.  No carotid bruits.  No thyromegaly.  No lymphadenopathy.  SKIN:  Warm and dry.  HEENT:  Oropharynx is clear.  Mucous membranes are moist.  NEURO:  Nonfocal.  LUNGS:  Clear to auscultation bilaterally without wheezes, rhonchi, or  crackles noted.  CARDIOVASCULAR:  Regular rate and rhythm without murmurs, gallops, or  rubs noted.  ABDOMEN:  Soft and nontender.  Bowel sounds are present.  EXTREMITIES:  The right lower extremity has an area of erythema over the  shin that is approximately 4 inches x 8 inches.  This is tender to touch  and has increased warmth.  The right lower extremity also has trace  edema.  There is no discoloration or edema of the left lower extremity.  There are no varicosities noted on either lower extremity.   DIAGNOSTIC STUDIES:  1. Venous duplex imaging of the lower extremities show no evidence of      deep or superficial venous thrombosis.  2. Arterial Doppler examination of the lower extremities shows that      the common femoral, superficial femoral, popliteal, and posterior      tibial arteries have brisk triphasic flow.   ASSESSMENT/PLAN:  This is a pleasant 50 year old Caucasian female with  past medical history significant for hypertension, hyperlipidemia, and  chronic lower extremity edema who comes in today for return visit.  She  states that her lower extremity edema has been much  improved over the  last few weeks.  However, she has noticed an area of increased redness  and warmth over her right shin area.  This area looks most consistent  with the superficial thrombophlebitis versus a cellulitis.  The patient  has had this occur before and was actually seen in the [facility]  Emergency Department several months ago with the same complaints.  At  that time, she was treated with a course of steroids which seemed to  improve the redness.  I will treat it today like it is a cellulitis and  will start her on Keflex 250 mg four times daily for 1 week.  She is to  call our office back in 7-10 days and let us know how the area of  erythema over her leg looks.  Should it worsen at all in appearance, she  should present to her local emergency department.  I do not see a reason  to see her on a regular basis in our Cardiology Office.  I have  encouraged her wear support stockings for dependent edema that is likely  secondary to venous insufficiency.  I have encouraged her to continue to  follow with her primary care physician.     Verne Carrow, MD  Electronically Signed    CM/MedQ  DD: 03/03/2008  DT: 03/04/2008  Job #: [id number]   cc:   Connie Perla, DO

## 2010-10-10 NOTE — Assessment & Plan Note (Signed)
[facility]                            CARDIOLOGY OFFICE NOTE   Connie [name], Connie [name]                      MRN:          [id number]  DATE:01/01/2008                            DOB:          15-Mar-1961    PRIMARY CARE PHYSICIAN:  Lelon Perla, DO   REASON FOR VISIT:  Lower extremity swelling.   HISTORY OF PRESENT ILLNESS:  Connie [name] is a pleasant 50 year old  Caucasian female with past medical history significant for hypertension,  hyperlipidemia, and lower extremity edema over the last 9 years who  presents today for further evaluation of her lower extremity edema.  The  patient states that for the last 9 years, she has had dependent edema in  her lower extremities that does improve at night when she lies in bed.  Occasionally, she notices a purplish or reddish discoloration of her  lower extremities when they are swollen.  There is bilateral involvement  of the lower extremities.  She was recently seen in the [facility]  Emergency Department on November 21, 2007, with similar complaint of  bilateral lower extremity swelling and was given prednisone at that time  for what seems to have been a rash over her legs.  She states that the  swelling went away while she was taking prednisone and the rash seemed  to disappear as well.  She has not taken prednisone in sometime.  She  also notes occasional chest discomfort that occurs during times of  stress.  She has been under much stress lately secondary to the loss of  her job, her car, and her house.  She denies any shortness of breath,  palpitations, dizziness, near-syncope, or syncope.  She has taken no  recent long car trips and as I stated above, states that this problem  has been present on and off for the last 9 years.   PAST MEDICAL HISTORY:  1. Hypertension.  2. Hyperlipidemia.  3. Chronic sinus infections.  4. Periodic bilateral lower extremity edema.   PAST SURGICAL HISTORY:  1.  Hysterectomy.  2. Cholecystectomy.  3. Disk repair in her neck.   ALLERGIES:  1. NIACIN.  2. CODEINE.   CURRENT MEDICATIONS:  1. Hydrochlorothiazide 25 mg once daily.  2. Benazepril 40 mg once daily.  3. Citalopram 10 mg once daily.  4. Flaxseed oil 1200 mg once daily.  5. B6 vitamin 100 mg once daily.   SOCIAL HISTORY:  The patient does not use tobacco, alcohol, or illicit  drugs.  She is divorced and is currently unemployed.   FAMILY HISTORY:  The patient's mother and grandmother had strokes.  She  is not aware of her father's health.  She does have a sister who has  diabetes mellitus.  There does not seem to be any family history of  sudden cardiac death or premature coronary artery disease.   REVIEW OF SYSTEMS:  As stated in the history of present illness and is  otherwise negative.   PHYSICAL EXAMINATION:  GENERAL:  She is a pleasant middle-aged Caucasian  female in no acute distress.  VITAL SIGNS:  Weight 270 pounds, blood pressure 159/87, pulse 82 and  regular, respirations 12 and nonlabored.  NECK:  No JVD.  No carotid bruits.  LUNGS:  Clear to auscultation bilaterally without wheezes, rhonchi, or  crackles noted.  CARDIOVASCULAR:  Regular rate and rhythm without murmurs, gallops, or  rubs noted.  ABDOMEN:  Soft and nontender.  Bowel sounds are present.  EXTREMITIES:  There is no edema of the bilateral lower extremities.  There are no areas of discoloration to suggest venous stasis.  Pulses  are 2+ in the bilateral dorsalis pedis, posterior tibial, and radial  arteries.  Popliteal pulse is difficult to palpate.  There did not seem  to be any cyst behind the patient's knees.  There is no discoloration of  the patient's toenails.  There are no ulcerations noted on her lower  extremities.   DIAGNOSTIC STUDIES:  A 12-lead electrocardiogram obtained in our office  today shows normal sinus rhythm with a ventricular rate of 82 beats per  minute and nonspecific T-wave  abnormalities.  Intervals are noted to be  normal.   ASSESSMENT/PLAN:  This is a pleasant 50 year old Caucasian female with  past medical history significant for hypertension, hyperlipidemia, and  occasional bilateral lower extremity edema.  It seems that most likely  she has a dependent lower extremity edema that is related to poor venous  return.  She has no changes to suggest chronic venous stasis.  She tells  me that her edema does resolve at the end of each day.  I think it is  unlikely that she has chronic deep venous thrombosis in her lower  extremities.  We will; however, get venous duplexes to rule out deep  venous thrombosis.  Her pulses are fairly brisk in the lower  extremities; however, given her complaints of occasional pain in her  lower extremities, I will perform arterial Doppler studies.  I will plan  on seeing her back in my office in approximately 4 weeks to review the  results of her  studies.  At that time, if the studies have returned normal, I will  likely suggest that she wear compression stockings to help with the  dependent edema.     Verne Carrow, MD  Electronically Signed    CM/MedQ  DD: 01/01/2008  DT: 01/02/2008  Job #: [id number]   cc:   Lelon Perla, DO  Pricilla Riffle, MD, [facility]

## 2010-10-13 NOTE — H&P (Signed)
NAMEMICHAELEEN, DOWN NO.:  [id number]   MEDICAL RECORD NO.:  [id number]          PATIENT TYPE:  AMB   LOCATION:  [facility]                           FACILITY:  [facility]   PHYSICIAN:  Malachi Pro. Ambrose Mantle, M.D. DATE OF BIRTH:  1960-08-24   DATE OF ADMISSION:  07/10/2006  DATE OF DISCHARGE:                              HISTORY & PHYSICAL   HISTORY OF PRESENT ILLNESS:  This is a 50 year old white female, para 2-  0-1-2, who is admitted to the hospital for abdominal hysterectomy and  possible bilateral salpingo-oophorectomy for menorrhagia, anemia, and  pelvic pain.  The last menstrual period was June 28, 2006, lasted  seven days, periods occur usually every 30 days, her prior period was  late, December 2007, was very heavy lasting 7-8 days, using 8-9 pads a  day, for three days she passed egg sized clots.  The patient was seen in  August 2007 with anemia of 10 grams hemoglobin complaining of passing  egg sized clots 3-4 times a day, claiming that the clots came out before  she could get to the restroom, and pains in her lower abdomen.  Her  uterus was found to be 2x normal size, not very mobile, the cul-de-sac  nontender.  The patient underwent an ultrasound examination of the  pelvis on June 12, 2006, that showed five fibroids, the largest of  which was 3.3 by 2.9 by 3.3 cm.  The endometrial stripe was 1.2 cm.  The  patient was advised to take iron, after being seen in August, she took  iron and in September, her hemoglobin had risen to 11.3, hematocrit  33.6.  She is scheduled now for hysterectomy.  She has used Motrin for  pain and bleeding, and is still having the problems mentioned in the  present illness.   ALLERGIES:  CODEINE, except is not a true allergy, she gets woozy, and  to NIACIN.   MEDICATIONS:  Hydrochlorothiazide, aspirin, ibuprofen, ferrous sulfate,  potassium.   PAST SURGICAL HISTORY:  In 1994, she had gallbladder surgery.  Also, she  has had neck  surgery.  She had a cervical cerclage done during her third  pregnancy.   PAST MEDICAL HISTORY:  She has stasis dermatitis in her legs.  She has  had anemia, high blood pressure, and high cholesterol.   SOCIAL HISTORY:  She drinks occasionally.  She does not smoke.  She is a  bookkeeper.   FAMILY HISTORY:  Mother is 20 with heart trouble and diabetes.  Father  died with cancer.  A sister 21 with mental illness and diabetes.   REVIEW OF SYSTEMS:  She has had some blurred vision, thinks it is due to  starring at a computer at work.  She has some difficulty with bowel  movements, she feels like stool gets caught in a pouch, but no  significant rectocele has been appreciated.   OBSTETRIC HISTORY:  She has 50 year old and 23 year old daughters.  She  lost a pregnancy at 19 weeks carrying twins.   PHYSICAL EXAMINATION:  VITAL SIGNS:  Blood pressure 132/78, pulse 80,  weight 225  1/2 pounds.  HEENT:  No cranial abnormalities.  Extraocular movements intact.  Nose  and pharynx clear.  NECK:  Supple without thyromegaly.  LUNGS:  Clear to auscultation.  HEART:  Normal size and sounds, no murmurs.  BREASTS:  Soft without masses.  ABDOMEN:  Soft and nontender, no masses are palpable.  GU:  Pap smear in August 2007 was within normal limits.  The vulva and  vagina are clean.  There is some blood around the anus.  The patient  reports today that she has had some bleeding with bowel movements for a  year and has not reported it to a medical doctor or to me.  She states  it happens mainly with firm bowel movements.  This will be evaluated  after surgery.  The cervix is clean.  The uterus is difficult to feel  because it is somewhat tender, but it is thought to be posterior, upper  limits of normal size to 2x normal size.  The adnexa clear.  RECTAL:  Exam shows no palpable lesions and there is brown stool  present.   ADMITTING IMPRESSION:  Leiomyomata uteri, severe menorrhagia, anemia,  and  pelvic pain.  The patient is admitted for abdominal hysterectomy  because the uterus is not very mobile.  The decision on whether to  remove the tubes and ovaries will depend on pelvic pathology and the  patient stated wishes that she will give me at the time of surgery.  She  has been informed of the risks of surgery including but not limited to  pulmonary embolus, wound disruption, hemorrhage with need for  reoperation and/or transfusion, fistula formation, nerve injury,  intestinal obstruction.  She understands and is ready to proceed.      Malachi Pro. Ambrose Mantle, M.D.  Electronically Signed     TFH/MEDQ  D:  07/09/2006  T:  07/09/2006  Job:  [id number]

## 2010-10-13 NOTE — Op Note (Signed)
NAMEMARSA, Connie [name] NO.:  [id number]   MEDICAL RECORD NO.:  [id number]          PATIENT TYPE:  AMB   LOCATION:  SDC                           FACILITY:  [facility]   PHYSICIAN:  Malachi Pro. Ambrose Mantle, M.D. DATE OF BIRTH:  1960-09-18   DATE OF PROCEDURE:  07/10/2006  DATE OF DISCHARGE:                               OPERATIVE REPORT   PREOPERATIVE DIAGNOSIS:  Menorrhagia, anemia, fibroids, pelvic pain.   POSTOPERATIVE DIAGNOSIS:  Menorrhagia, anemia, fibroids, pelvic pain.   OPERATION:  Abdominal hysterectomy, bilateral salpingo-oophorectomy.   OPERATOR:  Malachi Pro. Ambrose Mantle, M.D.   ASSISTANT:  Sherron Monday, MD.   ANESTHESIA:  General.   DESCRIPTION OF PROCEDURE:  The patient was brought to the operating  room, placed under satisfactory general anesthesia.  She was placed in  frog-leg position.  The abdomen was prepped with Betadine solution.  The  vagina was prepped, urethra was prepped, the catheter was inserted to  straight drain.  Exam revealed the uterus be about 12 weeks size  irregular with fibroids.  No adnexal masses were felt.  The abdomen was  then draped as a sterile field.  A transverse incision was made and  carried in layers through the skin and subcutaneous tissue and fascia.  The incision was made above the crease of her panniculus to avoid  burying the incision in an unventilated area.  The fascia was incised  transversely, separated from the rectus muscles superiorly and  inferiorly.  The rectus muscle was then split in the midline.  The  peritoneum was opened vertically.  Exploration of the upper abdomen  revealed the liver to be smooth.  I could only feel the lower part of  the liver.  The gallbladder had been previously removed.  There was an  adhesion to the right anterior upper abdominal wall.  I could not see  this so I do not know exactly the nature of the adhesion.  The tips of  both kidneys were felt.  No upper abdominal abnormalities were  noted.  I  did palpate the lower bowel because of a history of rectal bleeding.  I  felt no abnormality.  The uterus was approximately 12 weeks size,  irregular with fibroids.  It did not move well out of the pelvis.  I  initially tried a flexible retractor but it did not work well, so I  moved to the metal retractor with a bladder blade, used two packs to  pack the bowel away and create an operative field.  I divided both round  ligaments and then divided both infundibulopelvic ligaments between  clamps and doubly suture ligated them.  I actually did not get  completely below either infundibulopelvic ligament and actually had some  bleeding from both sides before I was able to completely secure them.  I  then dissected with the electrical current down below the uterus, tubes  and ovaries.  For better visualization, I removed both tubes and  ovaries, tried to skeletonize the uterine vessels as well as could be,  clamped, cut and suture ligated them and then because the  incision was  somewhat high, had a little bit of trouble seeing inferiorly, but I was  able to create a good plane so that I could move the bladder down.  I  then continued to clamp, cut and suture ligate along the parametrial and  paracervical tissues until I reached the uterosacral ligaments.  They  were clamped, cut, suture ligated and held and both vaginal angles were  easily entered and the uterus was removed by transecting the upper  vagina.  Vaginal angle sutures were placed and held and then the central  portion the vagina was closed with interrupted figure-of-eight sutures  of 0 Vicryl.  There was some bleeding from the right uterosacral  ligament and over the vaginal cuff.  All bleeding was taking care of  with interrupted figure-of-eight 0-0 Vicryl and 3-0 Vicryl sutures.  I  tried to keep the bladder well free of the operative field at all times  liberal irrigation still confirmed a little bleeding.  I sutured  the  right round ligament.  This seemed to improve the hemostasis.  I sutured  the uterosacral ligaments together in the midline.  Some bleeding was  coming from where the posterior peritoneum had been pushed posteriorly.  I controlled this with figure-of-eight 0-0 and 3-0 Vicryl.  Now  hemostasis appeared completely adequate.  I checked for both ureters.  Both ureters felt and looked normal.  Pack and retractors were removed.  Peritoneum was closed with the rectus muscle in one layer using  interrupted sutures of  Vicryl.  I used a 3-0 Vicryl to close to stop  some bleeding from the left rectus muscle belly then I closed the fascia  with two running sutures of  Vicryl, the subcu with a running 3-0 Vicryl  and the skin was closed with automatic staples.  Blood loss was 350 mL  in the canister, estimated blood loss at 400-500 mL total.  Sponge and  needle counts were correct.  It should be noted that during the  attention to hemostasis in the pelvis there was a loop of bowel, I think  it was the cecum that actually protruded into the operative field and I  stuck the piece of bowel with the needle.  I do not know that it  penetrated the lumen, but I used a 3-0 silk suture to place a  pursestring suture around the area where I thought that I had stuck the  bowel and closed this down.  The patient was returned to recovery in  satisfactory condition.      Malachi Pro. Ambrose Mantle, M.D.  Electronically Signed     TFH/MEDQ  D:  07/10/2006  T:  07/10/2006  Job:  [id number]

## 2010-10-13 NOTE — Discharge Summary (Signed)
NAMEIMOJEAN, Connie NO.:  [id number]   MEDICAL RECORD NO.:  [id number]          PATIENT TYPE:  INP   LOCATION:  9319                          FACILITY:  [facility]   PHYSICIAN:  Malachi Pro. Ambrose Mantle, M.D. DATE OF BIRTH:  [birth year]   DATE OF ADMISSION:  07/10/2006  DATE OF DISCHARGE:  07/12/2006                               DISCHARGE SUMMARY   This is a 50 year old white female, para 2-0-1-2, admitted for abdominal  hysterectomy, bilateral salpingo-oophorectomy for menorrhagia, anemia  and pelvic pain.  The patient underwent procedure under general  anesthesia on July 10, 2006 without complications.  Postoperatively,  she did quite well.  Her output was good.  Hemoglobin remained  relatively stable.  She was afebrile.  Blood pressure remained normal.  She voided well and ambulated well.  On the second postoperative day,  she was afebrile, tolerating a diet and was ready for discharge.   Laboratory data showed initial hemoglobin of 12.6, hematocrit 35.9,  white count 7400, platelet count 268,000.  Follow-up hemoglobins 11.4  and 10.3, hematocrits 33.1 and 29.7.  Comprehensive metabolic profile  was normal except for sodium of 134.  Estimated GFR was greater than 60.  Urine pregnancy test was negative.   Pathology report showed her uterus 254 grams with slight chronic  cervicitis, secretory endometrium, no evidence of hyperplasia or  malignancy, serosa unremarkable, right and left fallopian tubes  unremarkable, right and left ovaries corpus luteum and benign follicular  cysts, no endometriosis or evidence of malignancy; leiomyoma that were  intramural were seen.  There were multiple intramural leiomyomas which  measured up to 3.4 cm.   FINAL DIAGNOSIS:  1. Leiomyomata uteri.  2. Pelvic pain.  3. Menorrhagia.  4. History of significant anemia.   OPERATION:  Abdominal hysterectomy, bilateral salpingo-oophorectomy.   FINAL CONDITION:  Improved.   INSTRUCTIONS:   Include our regular discharge instructions.  No vaginal  entrance, no heavy lifting or strenuous activity.  Call with any  temperature elevation greater than 100.4 degrees.  Call with any unusual  problems.  Darvocet-N 100, 36 tablets 1 or 2 every 3-4 hours as needed  for pain is given at discharge, and the patient is advised to return in  several days to have her staples removed.      Malachi Pro. Ambrose Mantle, M.D.  Electronically Signed     TFH/MEDQ  D:  08/01/2006  T:  08/01/2006  Job:  [id number]

## 2010-10-13 NOTE — Assessment & Plan Note (Signed)
[facility]                                 ON-CALL NOTE   NAME:[name]                      MRN:          [id number]  DATE:04/05/2008                            DOB:          02/02/1961    Phone #:  [phone]   Patient of Dr. Laury Axon   SUBJECTIVE:  Connie [name] called stating that she has had a rash off and  on for the past 9 years that has been diagnosed as multiple different  things by different doctors ranging from phlebitis to cellulitis to  inflammation to dermatitis from swelling in her left leg.  She has  recently just completed a course of antibiotics.  Today, area of redness  has not changed much.  She does not have any fever and is not vomiting,  although she does feel a little bit nauseous.  She is wondering whether  there is anything else she should do this evening.  The area of rash is  not spreading but is tender.   ASSESSMENT AND PLAN:  This seems to be a very chronic rash.  It does not  sound like cellulitis.  I recommended her calling her dermatologist and  considering a biopsy.  If she does have increasing pain in her leg or  redness spreading or fevers, she was told to go to the emergency room  for treatment.     Kerby Nora, MD  Electronically Signed    AB/MedQ  DD: 04/05/2008  DT: 04/06/2008  Job #: [id number]

## 2010-11-07 ENCOUNTER — Encounter: Payer: Self-pay | Admitting: Cardiovascular Disease

## 2011-10-24 ENCOUNTER — Emergency Department (HOSPITAL_COMMUNITY): Payer: PRIVATE HEALTH INSURANCE

## 2011-10-24 ENCOUNTER — Emergency Department (HOSPITAL_COMMUNITY)
Admission: EM | Admit: 2011-10-24 | Discharge: 2011-10-25 | Disposition: A | Discharge status: Home / Self Care | Payer: PRIVATE HEALTH INSURANCE | Attending: Emergency Medicine | Admitting: Emergency Medicine

## 2011-10-24 ENCOUNTER — Encounter (HOSPITAL_COMMUNITY): Payer: Self-pay | Admitting: *Deleted

## 2011-10-24 DIAGNOSIS — Z7982 Long term (current) use of aspirin: Secondary | ICD-10-CM | POA: Insufficient documentation

## 2011-10-24 DIAGNOSIS — I10 Essential (primary) hypertension: Secondary | ICD-10-CM

## 2011-10-24 DIAGNOSIS — R509 Fever, unspecified: Secondary | ICD-10-CM | POA: Insufficient documentation

## 2011-10-24 DIAGNOSIS — R079 Chest pain, unspecified: Secondary | ICD-10-CM | POA: Insufficient documentation

## 2011-10-24 DIAGNOSIS — E785 Hyperlipidemia, unspecified: Secondary | ICD-10-CM | POA: Insufficient documentation

## 2011-10-24 DIAGNOSIS — Z79899 Other long term (current) drug therapy: Secondary | ICD-10-CM | POA: Insufficient documentation

## 2011-10-24 LAB — BASIC METABOLIC PANEL
BUN: 16 mg/dL (ref 6–23)
CO2: 29 mEq/L (ref 19–32)
Calcium: 9.1 mg/dL (ref 8.4–10.5)
Chloride: 100 mEq/L (ref 96–112)
Creatinine, Ser: 0.94 mg/dL (ref 0.50–1.10)
GFR calc Af Amer: 81 mL/min — ABNORMAL LOW (ref >90)
GFR calc non Af Amer: 70 mL/min — ABNORMAL LOW (ref >90)
Glucose, Bld: 111 mg/dL — ABNORMAL HIGH (ref 70–99)
Potassium: 3.8 mEq/L (ref 3.5–5.1)
Sodium: 142 mEq/L (ref 135–145)

## 2011-10-24 LAB — CBC
HCT: 39.8 % (ref 36.0–46.0)
Hemoglobin: 13.8 g/dL (ref 12.0–15.0)
MCH: 29.8 pg (ref 26.0–34.0)
MCHC: 34.7 g/dL (ref 30.0–36.0)
MCV: 86 fL (ref 78.0–100.0)
Platelets: 218 10*3/uL (ref 150–400)
RBC: 4.63 MIL/uL (ref 3.87–5.11)
RDW: 14.1 % (ref 11.5–15.5)
WBC: 8.6 10*3/uL (ref 4.0–10.5)

## 2011-10-24 LAB — POCT I-STAT TROPONIN I: Troponin i, poc: 0 ng/mL (ref 0.00–0.08)

## 2011-10-24 MED ORDER — NITROGLYCERIN 0.4 MG SL SUBL: 0.4000 mg | SUBLINGUAL_TABLET | SUBLINGUAL | Status: DC | PRN

## 2011-10-24 NOTE — ED Notes (Signed)
Pt c/o R sided neck throbbing x 1 week. However, today, pt experienced pain.  Pain does not radiate.  Pt's mother had a blocked carotid artery at this same age and pt is worried this is the same thing.  Pt did have heaviness in chest last week, but denies presently.  Did not take any asa today.

## 2011-10-25 MED ORDER — HYDROCHLOROTHIAZIDE 25 MG PO TABS: 25.0000 mg | ORAL_TABLET | Freq: Every day | ORAL | Status: DC

## 2011-10-25 NOTE — Discharge Instructions (Signed)
Follow up with your md next week. °

## 2011-10-25 NOTE — ED Provider Notes (Signed)
History     CSN: [id number]  Arrival date & time 10/24/11  2310   First MD Initiated Contact with Patient 10/25/11 0007      Chief Complaint  Patient presents with  . Chest Pain    (Consider location/radiation/quality/duration/timing/severity/associated sxs/prior treatment) Patient is a 51 y.o. female presenting with chest pain. The history is provided by the patient (pt complains of some chest tightness and swelling in legs). No language interpreter was used.  Chest Pain The chest pain began 1 - 2 hours ago. Chest pain occurs intermittently. The chest pain is resolved. The pain is associated with stress. At its most intense, the pain is at 5/10. The pain is currently at 2/10. The severity of the pain is moderate. The quality of the pain is described as aching. The pain does not radiate. Chest pain is worsened by stress. Primary symptoms include a fever. Pertinent negatives for primary symptoms include no fatigue, no cough and no abdominal pain.  Pertinent negatives for past medical history include no seizures.     Past Medical History  Diagnosis Date  . Allergic rhinitis   . Hyperlipidemia   . Hypertension   . Lower extremity edema     Past Surgical History  Procedure Date  . Cholecystectomy   . Cervical spine surgery     Ruptured disc  . Abdominal hysterectomy 2008  . Oophorectomy 2008    Family History  Problem Relation Age of Onset  . Stroke Mother   . Coronary artery disease Neg Hx     Premature or sudden cardiac death  . Diabetes Other     1st degree relative  . Hypertension Other   . Pancreatic cancer Other   . Stroke Other     Grandmother    History  Substance Use Topics  . Smoking status: Never Smoker   . Smokeless tobacco: Not on file  . Alcohol Use: Yes    OB History    Grav Para Term Preterm Abortions TAB SAB Ect Mult Living                  Review of Systems  Constitutional: Positive for fever. Negative for fatigue.  HENT: Negative for  congestion, sinus pressure and ear discharge.   Eyes: Negative for discharge.  Respiratory: Negative for cough.   Cardiovascular: Positive for chest pain.  Gastrointestinal: Negative for abdominal pain and diarrhea.  Genitourinary: Negative for frequency and hematuria.  Musculoskeletal: Negative for back pain.  Skin: Negative for rash.  Neurological: Negative for seizures and headaches.  Hematological: Negative.   Psychiatric/Behavioral: Negative for hallucinations.    Allergies  Codeine and Niacin  Home Medications   Current Outpatient Rx  Name Route Sig Dispense Refill  . ASPIRIN EC 81 MG PO TBEC Oral Take 81 mg by mouth as needed. For pain.    Marland Kitchen MYLANTA PO Oral Take 30 mLs by mouth at bedtime as needed. For heartburn/GERD    . TUMS PO Oral Take 6 tablets by mouth as needed. For heartburn    . ADULT MULTIVITAMIN W/MINERALS CH Oral Take 1 tablet by mouth daily.    Marland Kitchen NAPROXEN PO Oral Take 2 tablets by mouth daily as needed. For pain    . OVER THE COUNTER MEDICATION Oral Take 1 tablet by mouth daily. Diaretic    . OVER THE COUNTER MEDICATION Oral Take 1 tablet by mouth daily as needed. cumanin all natural For pain.    Marland Kitchen HYDROCHLOROTHIAZIDE 25 MG PO TABS Oral  Take 1 tablet (25 mg total) by mouth daily. 30 tablet 1    BP 188/105  Pulse 69  Temp(Src) 97.7 F (36.5 C) (Oral)  Resp 20  Ht 5\' 7"  (1.702 m)  Wt 220 lb (99.791 kg)  BMI 34.46 kg/m2  SpO2 97%  Physical Exam  Constitutional: She is oriented to person, place, and time. She appears well-developed.  HENT:  Head: Normocephalic and atraumatic.  Eyes: Conjunctivae and EOM are normal. No scleral icterus.  Neck: Neck supple. No thyromegaly present.  Cardiovascular: Normal rate and regular rhythm.  Exam reveals no gallop and no friction rub.   No murmur heard. Pulmonary/Chest: No stridor. She has no wheezes. She has no rales. She exhibits no tenderness.  Abdominal: She exhibits no distension. There is no tenderness. There  is no rebound.  Musculoskeletal: Normal range of motion. She exhibits no edema.  Lymphadenopathy:    She has no cervical adenopathy.  Neurological: She is oriented to person, place, and time. Coordination normal.  Skin: No rash noted. No erythema.  Psychiatric: She has a normal mood and affect. Her behavior is normal.    ED Course  Procedures (including critical care time)  Labs Reviewed  BASIC METABOLIC PANEL - Abnormal; Notable for the following:    Glucose, Bld 111 (*)    GFR calc non Af Amer 70 (*)    GFR calc Af Amer 81 (*)    All other components within normal limits  CBC  POCT I-STAT TROPONIN I   Dg Chest 2 View  10/25/2011  *RADIOLOGY REPORT*  Clinical Data: Chest tightness for 1 week.  CHEST - 2 VIEW  Comparison: None.  Findings: Borderline heart size with normal pulmonary vascularity. The no focal airspace consolidation in the lungs.  No blunting of costophrenic angles.  No pneumothorax.  Degenerative changes in the spine.  IMPRESSION: No evidence of active pulmonary disease.  Original Report Authenticated By: Marlon Pel, M.D.     1. Chest pain   2. HTN (hypertension)    .ededk  Date: 10/25/2011  Rate:69  Rhythm: normal sinus rhythm  QRS Axis: normal  Intervals: normal  ST/T Wave abnormalities: nonspecific ST changes  Conduction Disutrbances:none  Narrative Interpretation:   Old EKG Reviewed: none available     MDM          Benny Lennert, MD 10/25/11 [phone]

## 2012-02-14 ENCOUNTER — Ambulatory Visit (INDEPENDENT_AMBULATORY_CARE_PROVIDER_SITE_OTHER): Payer: BC Managed Care – PPO | Admitting: Family Medicine

## 2012-02-14 VITALS — BP 160/98 | HR 76 | Temp 98.0°F | Resp 18 | Ht 67.0 in | Wt 225.0 lb

## 2012-02-14 DIAGNOSIS — J069 Acute upper respiratory infection, unspecified: Secondary | ICD-10-CM

## 2012-02-14 DIAGNOSIS — B372 Candidiasis of skin and nail: Secondary | ICD-10-CM

## 2012-02-14 MED ORDER — HYDROCHLOROTHIAZIDE 25 MG PO TABS: 25.0000 mg | ORAL_TABLET | Freq: Every day | ORAL | Status: DC

## 2012-02-14 MED ORDER — NYSTATIN 100000 UNIT/GM EX POWD: Freq: Four times a day (QID) | CUTANEOUS | Status: DC

## 2012-02-14 MED ORDER — AMOXICILLIN 500 MG PO CAPS: 500.0000 mg | ORAL_CAPSULE | Freq: Two times a day (BID) | ORAL | Status: DC

## 2012-02-14 MED ORDER — FLUCONAZOLE 150 MG PO TABS: 150.0000 mg | ORAL_TABLET | Freq: Once | ORAL | Status: DC

## 2012-02-14 NOTE — Patient Instructions (Addendum)
Take the Amoxicillin twice a day for the next 14 days. Take the Diflucan 1 pill today. Use the Nystatin powder 4 times a day for the next week.     Yeast Infection of the Skin Some yeast on the skin is normal, but sometimes it causes an infection. If you have a yeast infection, it shows up as white or light brown patches on brown skin. You can see it better in the summer on tan skin. It causes light-colored holes in your suntan. It can happen on any area of the body. This cannot be passed from person to person. HOME CARE  Scrub your skin daily with a dandruff shampoo. Your rash may take a couple weeks to get well.   Do not scratch or itch the rash.  GET HELP RIGHT AWAY IF:   You get another infection from scratching. The skin may get warm, red, and may ooze fluid.   The infection does not seem to be getting better.  MAKE SURE YOU:  Understand these instructions.   Will watch your condition.   Will get help right away if you are not doing well or get worse.  Document Released: 04/26/2008 Document Revised: 05/03/2011 Document Reviewed: 04/26/2008 [facility] Patient Information 2012 [place], [place].

## 2012-02-14 NOTE — Progress Notes (Signed)
Patient ID: Connie [name], female   DOB: 12-18-1960, 51 y.o.   MRN: [id number] Connie [name] is a 51 y.o. female who presents to Urgent Care today for 2 main issues;  1.  URI symptoms:  Present for past month.  Describes rhinorrhea, sinus congestion, mild cough.  Also with sinus pressure and pain.  Initially better but then worse again 2 weeks ago.   Has had some radiation of pain to her teeth as well.  Has tried Tylenol sinus without relief.  Sick contacts are none.  No fevers or chills.    2.  C-section scar read:  Patient has been having increased redness and pain along her C-section scar.  This has been present for past 2-3 weeks.  Some mild nausea but no vomiting.  Hysterectomy performed in 2008, no problems since then.    PMH reviewed.  ROS as above otherwise neg.  No chest pain, palpitations, SOB, Fever, Chills, Abd pain..  Medications reviewed. Current Outpatient Prescriptions  Medication Sig Dispense Refill  . aspirin EC 81 MG tablet Take 81 mg by mouth as needed. For pain. Pt states she takes 2      . Multiple Vitamin (MULITIVITAMIN WITH MINERALS) TABS Take 1 tablet by mouth daily.      . Calcium & Magnesium Carbonates (MYLANTA PO) Take 30 mLs by mouth at bedtime as needed. For heartburn/GERD      . Calcium Carbonate Antacid (TUMS PO) Take 6 tablets by mouth as needed. For heartburn      . hydrochlorothiazide (HYDRODIURIL) 25 MG tablet Take 1 tablet (25 mg total) by mouth daily.  30 tablet  1  . NAPROXEN PO Take 2 tablets by mouth daily as needed. For pain      . OVER THE COUNTER MEDICATION Take 1 tablet by mouth daily. Diaretic      . OVER THE COUNTER MEDICATION Take 1 tablet by mouth daily as needed. cumanin all natural For pain.        Exam:  BP 160/98  Pulse 76  Temp 98 F (36.7 C) (Oral)  Resp 18  Ht 5\' 7"  (1.702 m)  Wt 225 lb (102.059 kg)  BMI 35.24 kg/m2  SpO2 99% Gen:  Patient sitting on exam table, appears stated age in no acute distress Head: Normocephalic  atraumatic Eyes: EOMI, PERRL, sclera and conjunctiva non-erythematous Nose:  Nasal turbinates grossly enlarged bilaterally. Some exudates noted. Tender to palpation of maxillary sinus and ethmoid sinus Mouth: Mucosa membranes moist. Tonsils +2, nonenlarged, non-erythematous. Neck: No cervical lymphadenopathy noted Heart:  RRR, no murmurs auscultated. Pulm:  Clear to auscultation bilaterally with good air movement.  No wheezes or rales noted.   Abd:  Soft/nondistended/nontender.  Good bowel sounds throughout all four quadrants.  No masses noted.  Yeast candidiasis noted along line of hysterectomy scar.  Maceration of skin noted.    Assessment and Plan:  1.  Plan to treat due to length of symptoms and no improvement.   Will prescribe Amoxicillin x 14 days due to prolonged sinus problems.  Instructed patient to return in 1 week for checkup or sooner if worsening or no improvement.   2.  Candidiasis:  Yeast infection of skin.  Treat with Diflucan and nystatin powder.

## 2012-02-15 ENCOUNTER — Other Ambulatory Visit: Payer: Self-pay | Admitting: *Deleted

## 2012-02-15 MED ORDER — HYDROCHLOROTHIAZIDE 25 MG PO TABS: 25.0000 mg | ORAL_TABLET | Freq: Every day | ORAL | Status: DC

## 2012-03-17 ENCOUNTER — Ambulatory Visit (INDEPENDENT_AMBULATORY_CARE_PROVIDER_SITE_OTHER): Payer: BC Managed Care – PPO | Admitting: Family Medicine

## 2012-03-17 VITALS — BP 158/89 | HR 72 | Temp 98.1°F | Resp 16 | Ht 66.78 in | Wt 233.6 lb

## 2012-03-17 DIAGNOSIS — E669 Obesity, unspecified: Secondary | ICD-10-CM

## 2012-03-17 DIAGNOSIS — J329 Chronic sinusitis, unspecified: Secondary | ICD-10-CM

## 2012-03-17 DIAGNOSIS — J019 Acute sinusitis, unspecified: Secondary | ICD-10-CM

## 2012-03-17 MED ORDER — LEVOFLOXACIN 500 MG PO TABS: 500.0000 mg | ORAL_TABLET | Freq: Every day | ORAL | Status: DC

## 2012-03-17 NOTE — Progress Notes (Signed)
@[email or web address] @   Patient ID: Connie [name] MRN: [id number], DOB: [birth year], 51 y.o. Date of Encounter: 03/17/2012, 7:51 PM  Primary Physician: Loreen Freud, DO  Chief Complaint:  Chief Complaint  Patient presents with  . Sinusitis    x 2 days    HPI: 51 y.o. year old female presents with 3 day history of nasal congestion, post nasal drip, sore throat, sinus pressure, and cough. Afebrile. No chills. Nasal congestion thick and green/yellow. Sinus pressure is the worst symptom. Cough is productive secondary to post nasal drip and not associated with time of day. Ears feel full, leading to sensation of muffled hearing. Has tried OTC cold preps without success. 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  Book keeper  No recent antibiotics, recent travels, or sick contacts   No leg trauma, sedentary periods, h/o cancer, or tobacco use.  Past Medical History  Diagnosis Date  . Allergic rhinitis   . Hyperlipidemia   . Hypertension   . Lower extremity edema      Home Meds: Prior to Admission medications   Medication Sig Start Date End Date Taking? Authorizing Provider  aspirin EC 81 MG tablet Take 81 mg by mouth as needed. For pain. Pt states she takes 2   Yes Historical Provider, MD  Calcium & Magnesium Carbonates (MYLANTA PO) Take 30 mLs by mouth at bedtime as needed. For heartburn/GERD   Yes Historical Provider, MD  Calcium Carbonate Antacid (TUMS PO) Take 6 tablets by mouth as needed. For heartburn   Yes Historical Provider, MD  hydrochlorothiazide (HYDRODIURIL) 25 MG tablet Take 1 tablet (25 mg total) by mouth daily. 02/15/12 02/14/13 Yes Tobey Grim, MD  Multiple Vitamin (MULITIVITAMIN WITH MINERALS) TABS Take 1 tablet by mouth daily.   Yes Historical Provider, MD  NAPROXEN PO Take 2 tablets by mouth daily as needed. For pain   Yes Historical Provider, MD  nystatin (MYCOSTATIN) powder Apply topically 4 (four) times daily. 02/14/12  Yes Tobey Grim, MD  OVER THE COUNTER MEDICATION Take 1 tablet by  mouth daily. Diaretic   Yes Historical Provider, MD    Allergies:  Allergies  Allergen Reactions  . Codeine Other (See Comments)    incoherent  . Niacin Other (See Comments)    Turns red from chest up. Body hot.    History   Social History  . Marital Status: Single    Spouse Name: N/A    Number of Children: N/A  . Years of Education: N/A   Occupational History  . Lost job recently    Social History Main Topics  . Smoking status: Never Smoker   . Smokeless tobacco: Not on file  . Alcohol Use: Yes  . Drug Use: No  . Sexually Active: Not on file   Other Topics Concern  . Not on file   Social History Narrative   Single momDivorcedDoes not exercise regularly     Review of Systems: Constitutional: negative for chills, fever, night sweats or weight changes Cardiovascular: negative for chest pain or palpitations Respiratory: negative for hemoptysis, wheezing, or shortness of breath Abdominal: negative for abdominal pain, nausea, vomiting or diarrhea Dermatological: negative for rash Neurologic: negative for headache   Physical Exam: Blood pressure 158/89, pulse 72, temperature 98.1 F (36.7 C), temperature source Oral, resp. rate 16, height 5' 6.78" (1.696 m), weight 233 lb 9.6 oz (105.96 kg), SpO2 98.00%., Body mass index is 36.83 kg/(m^2). General: Well developed, well nourished, in no acute distress. Head: Normocephalic, atraumatic, eyes without discharge, sclera non-icteric,  nares are congested. Bilateral auditory canals clear, TM's are without perforation, pearly grey with reflective cone of light bilaterally. Serous effusion bilaterally behind TM's. Maxillary sinus TTP. Oral cavity moist, dentition normal. Posterior pharynx with post nasal drip and mild erythema. No peritonsillar abscess or tonsillar exudate. Neck: Supple. No thyromegaly. Full ROM. No lymphadenopathy. Lungs: Clear bilaterally to auscultation without wheezes, rales, or rhonchi. Breathing is  unlabored.  Heart: RRR with S1 S2. No murmurs, rubs, or gallops appreciated. Msk:  Strength and tone normal for age. Extremities: No clubbing or cyanosis. No edema. Neuro: Alert and oriented X 3. Moves all extremities spontaneously. CNII-XII grossly in tact. Psych:  Responds to questions appropriately with a normal affect.      ASSESSMENT AND PLAN:  51 y.o. year old female with sinusitis - No diagnosis found.  -Tylenol/Motrin prn -Rest/fluids -RTC precautions -RTC 3-5 days if no improvement  Signed, Elvina Sidle, MD 03/17/2012 7:51 PM

## 2012-03-17 NOTE — Patient Instructions (Addendum)

## 2012-03-19 ENCOUNTER — Other Ambulatory Visit: Payer: Self-pay | Admitting: Family Medicine

## 2012-03-19 DIAGNOSIS — E039 Hypothyroidism, unspecified: Secondary | ICD-10-CM | POA: Insufficient documentation

## 2012-03-19 LAB — TSH: TSH: 16.134 u[IU]/mL — ABNORMAL HIGH (ref 0.350–4.500)

## 2012-03-19 MED ORDER — LEVOTHYROXINE SODIUM 50 MCG PO TABS: 50.0000 ug | ORAL_TABLET | Freq: Every day | ORAL | Status: DC

## 2012-03-20 ENCOUNTER — Telehealth: Payer: Self-pay

## 2012-03-20 NOTE — Telephone Encounter (Signed)
Pt had LM on nurse VM wanting to ask some more questions about levothyroxine. Called pt back and explained that she is hypothyroid and answered pt's other ?s.

## 2012-04-10 ENCOUNTER — Other Ambulatory Visit: Payer: Self-pay | Admitting: *Deleted

## 2012-04-10 MED ORDER — HYDROCHLOROTHIAZIDE 25 MG PO TABS: 25.0000 mg | ORAL_TABLET | Freq: Every day | ORAL | Status: DC

## 2012-04-27 DIAGNOSIS — Z0271 Encounter for disability determination: Secondary | ICD-10-CM

## 2012-05-31 ENCOUNTER — Encounter (HOSPITAL_COMMUNITY): Payer: Self-pay

## 2012-05-31 ENCOUNTER — Emergency Department (HOSPITAL_COMMUNITY)
Admission: EM | Admit: 2012-05-31 | Discharge: 2012-06-01 | Disposition: A | Discharge status: Home / Self Care | Payer: BC Managed Care – PPO | Attending: Emergency Medicine | Admitting: Emergency Medicine

## 2012-05-31 DIAGNOSIS — E785 Hyperlipidemia, unspecified: Secondary | ICD-10-CM | POA: Insufficient documentation

## 2012-05-31 DIAGNOSIS — Z8739 Personal history of other diseases of the musculoskeletal system and connective tissue: Secondary | ICD-10-CM | POA: Insufficient documentation

## 2012-05-31 DIAGNOSIS — Z9079 Acquired absence of other genital organ(s): Secondary | ICD-10-CM | POA: Insufficient documentation

## 2012-05-31 DIAGNOSIS — K5289 Other specified noninfective gastroenteritis and colitis: Secondary | ICD-10-CM | POA: Insufficient documentation

## 2012-05-31 DIAGNOSIS — Z79899 Other long term (current) drug therapy: Secondary | ICD-10-CM | POA: Insufficient documentation

## 2012-05-31 DIAGNOSIS — R112 Nausea with vomiting, unspecified: Secondary | ICD-10-CM | POA: Insufficient documentation

## 2012-05-31 DIAGNOSIS — Z7982 Long term (current) use of aspirin: Secondary | ICD-10-CM | POA: Insufficient documentation

## 2012-05-31 DIAGNOSIS — Z9889 Other specified postprocedural states: Secondary | ICD-10-CM | POA: Insufficient documentation

## 2012-05-31 DIAGNOSIS — Z9089 Acquired absence of other organs: Secondary | ICD-10-CM | POA: Insufficient documentation

## 2012-05-31 DIAGNOSIS — K529 Noninfective gastroenteritis and colitis, unspecified: Secondary | ICD-10-CM

## 2012-05-31 DIAGNOSIS — R197 Diarrhea, unspecified: Secondary | ICD-10-CM | POA: Insufficient documentation

## 2012-05-31 DIAGNOSIS — Z8709 Personal history of other diseases of the respiratory system: Secondary | ICD-10-CM | POA: Insufficient documentation

## 2012-05-31 DIAGNOSIS — Z90710 Acquired absence of both cervix and uterus: Secondary | ICD-10-CM | POA: Insufficient documentation

## 2012-05-31 DIAGNOSIS — I10 Essential (primary) hypertension: Secondary | ICD-10-CM | POA: Insufficient documentation

## 2012-05-31 DIAGNOSIS — E039 Hypothyroidism, unspecified: Secondary | ICD-10-CM | POA: Insufficient documentation

## 2012-05-31 HISTORY — DX: Hypothyroidism, unspecified: E03.9

## 2012-05-31 LAB — URINALYSIS, ROUTINE W REFLEX MICROSCOPIC
Bilirubin Urine: NEGATIVE
Glucose, UA: NEGATIVE mg/dL
Hgb urine dipstick: NEGATIVE
Ketones, ur: NEGATIVE mg/dL
Leukocytes, UA: NEGATIVE
Nitrite: NEGATIVE
Protein, ur: NEGATIVE mg/dL
Specific Gravity, Urine: 1.017 (ref 1.005–1.030)
Urobilinogen, UA: 0.2 mg/dL (ref 0.0–1.0)
pH: 8 (ref 5.0–8.0)

## 2012-05-31 MED ORDER — SODIUM CHLORIDE 0.9 % IV BOLUS (SEPSIS)
1000.0000 mL | Freq: Once | INTRAVENOUS | Status: AC
  Administered 2012-06-01: 1000 mL via INTRAVENOUS

## 2012-05-31 MED ORDER — ONDANSETRON HCL 4 MG/2ML IJ SOLN
4.0000 mg | Freq: Once | INTRAMUSCULAR | Status: AC
  Administered 2012-06-01: 4 mg via INTRAVENOUS
  Filled 2012-05-31: qty 2

## 2012-05-31 NOTE — ED Notes (Signed)
Per pt, abdominal pain, n/v since 8pm. No prior symptoms. Pain is mid abdomen and into back.  Diarrhea noted also.

## 2012-05-31 NOTE — ED Provider Notes (Signed)
History     CSN: [id number]  Arrival date & time 05/31/12  2147   First MD Initiated Contact with Patient 05/31/12 2302      Chief Complaint  Patient presents with  . Abdominal Pain  . Emesis    (Consider location/radiation/quality/duration/timing/severity/associated sxs/prior treatment) HPI 52 year old female presents emergency department complaining of diffuse crampy abdominal pain associated with nausea vomiting and diarrhea starting this evening around 8 PM. Patient does not have pain without vomiting or diarrhea. No fevers. She has felt hot just prior to vomiting, and thinners reports she has been pale. Daughter with same illness 24 hours ago. Patient denies any previous abdominal surgery. Crampy abdominal pain is diffuse nonfocal. No travel, no unusual foods. She does have a sick contact in her daughter.  Past Medical History  Diagnosis Date  . Allergic rhinitis   . Hyperlipidemia   . Hypertension   . Lower extremity edema   . Hypothyroid     Past Surgical History  Procedure Date  . Cholecystectomy   . Cervical spine surgery     Ruptured disc  . Abdominal hysterectomy 2008  . Oophorectomy 2008    Family History  Problem Relation Age of Onset  . Stroke Mother   . Coronary artery disease Neg Hx     Premature or sudden cardiac death  . Diabetes Other     1st degree relative  . Hypertension Other   . Pancreatic cancer Other   . Stroke Other     Grandmother    History  Substance Use Topics  . Smoking status: Never Smoker   . Smokeless tobacco: Not on file  . Alcohol Use: Yes    OB History    Grav Para Term Preterm Abortions TAB SAB Ect Mult Living                  Review of Systems  See History of Present Illness; otherwise all other systems are reviewed and negative  Allergies  Codeine and Niacin  Home Medications   Current Outpatient Rx  Name  Route  Sig  Dispense  Refill  . ASPIRIN EC 81 MG PO TBEC   Oral   Take 81 mg by mouth daily.            Marland Kitchen MYLANTA PO   Oral   Take 30 mLs by mouth at bedtime as needed. For heartburn/GERD         . TUMS PO   Oral   Take 6 tablets by mouth as needed. For heartburn         . CLOBETASOL PROPIONATE 0.05 % EX LIQD   Topical   Apply 1 application topically 2 (two) times daily.         Marland Kitchen HYDROCHLOROTHIAZIDE 25 MG PO TABS   Oral   Take 1 tablet (25 mg total) by mouth daily.   30 tablet   1     NEEDS OFFICE VISIT BEFORE ANYMORE REFILLS   . LEVOTHYROXINE SODIUM 50 MCG PO TABS   Oral   Take 1 tablet (50 mcg total) by mouth daily.   90 tablet   3   . NAPROXEN PO   Oral   Take 2 tablets by mouth daily as needed. For pain         . SCHOLLS FOR HER ULTRA OVERNGHT EX CREA   Apply externally   Apply 1 application topically daily. Patient states that she applies it daily to inner thighs  BP 136/75  Pulse 83  Temp 97.6 F (36.4 C) (Oral)  Resp 18  SpO2 100%  Physical Exam  Nursing note and vitals reviewed. Constitutional: She is oriented to person, place, and time. She appears well-developed and well-nourished. She appears distressed (uncomfortable appearing).  HENT:  Head: Normocephalic and atraumatic.  Nose: Nose normal.  Mouth/Throat: Oropharynx is clear and moist.  Eyes: Conjunctivae normal and EOM are normal. Pupils are equal, round, and reactive to light.  Neck: Normal range of motion. Neck supple. No JVD present. No tracheal deviation present. No thyromegaly present.  Cardiovascular: Normal rate, regular rhythm, normal heart sounds and intact distal pulses.  Exam reveals no gallop and no friction rub.   No murmur heard. Pulmonary/Chest: Effort normal and breath sounds normal. No stridor. No respiratory distress. She has no wheezes. She has no rales. She exhibits no tenderness.  Abdominal: Soft. She exhibits no distension and no mass. There is tenderness (diffuse tenderness no rebound or guarding). There is no rebound and no guarding.        Hypoactive bowel sounds  Musculoskeletal: Normal range of motion. She exhibits no edema and no tenderness.  Lymphadenopathy:    She has no cervical adenopathy.  Neurological: She is alert and oriented to person, place, and time. She exhibits normal muscle tone. Coordination normal.  Skin: Skin is warm and dry. No rash noted. No erythema. No pallor.  Psychiatric: She has a normal mood and affect. Her behavior is normal. Judgment and thought content normal.    ED Course  Procedures (including critical care time)  Labs Reviewed  CBC WITH DIFFERENTIAL - Abnormal; Notable for the following:    WBC 14.9 (*)     Neutrophils Relative 91 (*)     Neutro Abs 13.5 (*)     Lymphocytes Relative 3 (*)     Lymphs Abs 0.4 (*)     All other components within normal limits  COMPREHENSIVE METABOLIC PANEL - Abnormal; Notable for the following:    Glucose, Bld 153 (*)     GFR calc non Af Amer 71 (*)     GFR calc Af Amer 82 (*)     All other components within normal limits  URINALYSIS, ROUTINE W REFLEX MICROSCOPIC  LIPASE, BLOOD   No results found.   1. Gastroenteritis, acute       MDM  52 year old female with nausea vomiting and diarrhea with some abdominal pain. Daughter with similar illness yesterday. We'll check baseline labs, give IV fluids and Zofran and monitor for improvement.        Olivia Mackie, MD 06/01/12 [phone]

## 2012-06-01 LAB — COMPREHENSIVE METABOLIC PANEL
ALT: 14 U/L (ref 0–35)
AST: 20 U/L (ref 0–37)
Albumin: 4 g/dL (ref 3.5–5.2)
Alkaline Phosphatase: 53 U/L (ref 39–117)
BUN: 19 mg/dL (ref 6–23)
CO2: 31 mEq/L (ref 19–32)
Calcium: 9.4 mg/dL (ref 8.4–10.5)
Chloride: 98 mEq/L (ref 96–112)
Creatinine, Ser: 0.92 mg/dL (ref 0.50–1.10)
GFR calc Af Amer: 82 mL/min — ABNORMAL LOW (ref >90)
GFR calc non Af Amer: 71 mL/min — ABNORMAL LOW (ref >90)
Glucose, Bld: 153 mg/dL — ABNORMAL HIGH (ref 70–99)
Potassium: 3.6 mEq/L (ref 3.5–5.1)
Sodium: 141 mEq/L (ref 135–145)
Total Bilirubin: 0.8 mg/dL (ref 0.3–1.2)
Total Protein: 7.2 g/dL (ref 6.0–8.3)

## 2012-06-01 LAB — CBC WITH DIFFERENTIAL/PLATELET
Basophils Absolute: 0 10*3/uL (ref 0.0–0.1)
Basophils Relative: 0 % (ref 0–1)
Eosinophils Absolute: 0.1 10*3/uL (ref 0.0–0.7)
Eosinophils Relative: 1 % (ref 0–5)
HCT: 41.3 % (ref 36.0–46.0)
Hemoglobin: 14 g/dL (ref 12.0–15.0)
Lymphocytes Relative: 3 % — ABNORMAL LOW (ref 12–46)
Lymphs Abs: 0.4 10*3/uL — ABNORMAL LOW (ref 0.7–4.0)
MCH: 29.4 pg (ref 26.0–34.0)
MCHC: 33.9 g/dL (ref 30.0–36.0)
MCV: 86.8 fL (ref 78.0–100.0)
Monocytes Absolute: 0.8 10*3/uL (ref 0.1–1.0)
Monocytes Relative: 5 % (ref 3–12)
Neutro Abs: 13.5 10*3/uL — ABNORMAL HIGH (ref 1.7–7.7)
Neutrophils Relative %: 91 % — ABNORMAL HIGH (ref 43–77)
Platelets: 226 10*3/uL (ref 150–400)
RBC: 4.76 MIL/uL (ref 3.87–5.11)
RDW: 14.6 % (ref 11.5–15.5)
WBC: 14.9 10*3/uL — ABNORMAL HIGH (ref 4.0–10.5)

## 2012-06-01 LAB — LIPASE, BLOOD: Lipase: 27 U/L (ref 11–59)

## 2012-06-01 MED ORDER — ONDANSETRON HCL 4 MG PO TABS: 4.0000 mg | ORAL_TABLET | Freq: Four times a day (QID) | ORAL | Status: DC

## 2012-06-01 NOTE — ED Notes (Signed)
Pt was able to keep ginger ale and crackers down

## 2012-06-04 ENCOUNTER — Ambulatory Visit (INDEPENDENT_AMBULATORY_CARE_PROVIDER_SITE_OTHER): Payer: BC Managed Care – PPO | Admitting: Family Medicine

## 2012-06-04 VITALS — BP 138/82 | HR 72 | Temp 98.3°F | Resp 16 | Ht 66.5 in | Wt 235.0 lb

## 2012-06-04 DIAGNOSIS — H6090 Unspecified otitis externa, unspecified ear: Secondary | ICD-10-CM

## 2012-06-04 DIAGNOSIS — J329 Chronic sinusitis, unspecified: Secondary | ICD-10-CM

## 2012-06-04 DIAGNOSIS — B372 Candidiasis of skin and nail: Secondary | ICD-10-CM

## 2012-06-04 DIAGNOSIS — E119 Type 2 diabetes mellitus without complications: Secondary | ICD-10-CM

## 2012-06-04 DIAGNOSIS — H60399 Other infective otitis externa, unspecified ear: Secondary | ICD-10-CM

## 2012-06-04 DIAGNOSIS — E039 Hypothyroidism, unspecified: Secondary | ICD-10-CM

## 2012-06-04 DIAGNOSIS — L739 Follicular disorder, unspecified: Secondary | ICD-10-CM

## 2012-06-04 LAB — POCT CBC
Granulocyte percent: 58.6 %G (ref 37–80)
HCT, POC: 44.6 % (ref 37.7–47.9)
Hemoglobin: 14.1 g/dL (ref 12.2–16.2)
Lymph, poc: 2.2 (ref 0.6–3.4)
MCH, POC: 28.5 pg (ref 27–31.2)
MCHC: 31.6 g/dL — AB (ref 31.8–35.4)
MCV: 90 fL (ref 80–97)
MID (cbc): 0.7 (ref 0–0.9)
MPV: 10.3 fL (ref 0–99.8)
POC Granulocyte: 4.2 (ref 2–6.9)
POC LYMPH PERCENT: 31.5 %L (ref 10–50)
POC MID %: 9.9 %M (ref 0–12)
Platelet Count, POC: 275 10*3/uL (ref 142–424)
RBC: 4.95 M/uL (ref 4.04–5.48)
RDW, POC: 15.3 %
WBC: 7.1 10*3/uL (ref 4.6–10.2)

## 2012-06-04 LAB — POCT GLYCOSYLATED HEMOGLOBIN (HGB A1C): Hemoglobin A1C: 6.7

## 2012-06-04 MED ORDER — METFORMIN HCL 500 MG PO TABS: 500.0000 mg | ORAL_TABLET | Freq: Every day | ORAL | Status: DC

## 2012-06-04 MED ORDER — FLUCONAZOLE 150 MG PO TABS: 150.0000 mg | ORAL_TABLET | Freq: Once | ORAL | Status: DC

## 2012-06-04 MED ORDER — CEFDINIR 300 MG PO CAPS: 300.0000 mg | ORAL_CAPSULE | Freq: Two times a day (BID) | ORAL | Status: DC

## 2012-06-04 MED ORDER — NYSTATIN 100000 UNIT/GM EX POWD: Freq: Four times a day (QID) | CUTANEOUS | Status: DC

## 2012-06-04 NOTE — Patient Instructions (Addendum)
Diabetes, Frequently Asked Questions WHAT IS DIABETES? Most of the food we eat is turned into glucose (sugar). Our bodies use it for energy. The pancreas makes a hormone called insulin. It helps glucose get into the cells of our bodies. When you have diabetes, your body either does not make enough insulin or cannot use its own insulin as well as it should. This causes sugars to build up in your blood. WHAT ARE THE SYMPTOMS OF DIABETES?  Frequent urination.  Excessive thirst.  Unexplained weight loss.  Extreme hunger.  Blurred vision.  Tingling or numbness in hands or feet.  Feeling very tired much of the time.  Dry, itchy skin.  Sores that are slow to heal.  Yeast infections. WHAT ARE THE TYPES OF DIABETES? Type 1 Diabetes   About 10% of affected people have this type.  Usually occurs before the age of 41.  Usually occurs in thin to normal weight people. Type 2 Diabetes  About 90% of affected people have this type.  Usually occurs after the age of 33.  Usually occurs in overweight people.  More likely to have:  A family history of diabetes.  A history of diabetes during pregnancy (gestational diabetes).  High blood pressure.  High cholesterol and triglycerides. Gestational Diabetes  Occurs in about 4% of pregnancies.  Usually goes away after the baby is born.  More likely to occur in women with:  Family history of diabetes.  Previous gestational diabetes.  Obese.  Over 51 years old. WHAT IS PRE-DIABETES? Pre-diabetes means your blood glucose is higher than normal, but lower than the diabetes range. It also means you are at risk of getting type 2 diabetes and heart disease. If you are told you have pre-diabetes, have your blood glucose checked again in 1 to 2 years. WHAT IS THE TREATMENT FOR DIABETES? Treatment is aimed at keeping blood glucose near normal levels at all times. Learning how to manage this yourself is important in treating diabetes.  Depending on the type of diabetes you have, your treatment will include one or more of the following:  Monitoring your blood glucose.  Meal planning.  Exercise.  Oral medicine (pills) or insulin. CAN DIABETES BE PREVENTED? With type 1 diabetes, prevention is more difficult, because the triggers that cause it are not yet known. With type 2 diabetes, prevention is more likely, with lifestyle changes:  Maintain a healthy weight.  Eat healthy.  Exercise. IS THERE A CURE FOR DIABETES? No, there is no cure for diabetes. There is a lot of research going on that is looking for a cure, and progress is being made. Diabetes can be treated and controlled. People with diabetes can manage their diabetes and lead normal, active lives. SHOULD I BE TESTED FOR DIABETES? If you are at least 52 years old, you should be tested for diabetes. You should be tested again every 3 years. If you are 45 or older and overweight, you may want to get tested more often. If you are younger than 45, overweight, and have one or more of the following risk factors, you should be tested:  Family history of diabetes.  Inactive lifestyle.  High blood pressure. WHAT ARE SOME OTHER SOURCES FOR INFORMATION ON DIABETES? The following organizations may help in your search for more information on diabetes: National Diabetes Education Program (NDEP) Internet: SolarDiscussions.es American Diabetes Association Internet: http://www.diabetes.org  Juvenile Diabetes Foundation International Internet: WetlessWash.is Document Released: 05/17/2003 Document Revised: 08/06/2011 Document Reviewed: 03/11/2009 [facility] Patient Information 2013 [place], [place].  Diabetes, Eating Away From Home Sometimes, you might eat in a restaurant or have meals that are prepared by someone else. You can enjoy eating out. However, the portions in restaurants may be much larger than needed. Listed below are some ideas to help you  choose foods that will keep your blood glucose (sugar) in better control.  TIPS FOR EATING OUT  Know your meal plan and how many carbohydrate servings you should have at each meal. You may wish to carry a copy of your meal plan in your purse or wallet. Learn the foods included in each food group.  Make a list of restaurants near you that offer healthy choices. Take a copy of the carry-out menus to see what they offer. Then, you can plan what you will order ahead of time.  Become familiar with serving sizes by practicing them at home using measuring cups and spoons. Once you learn to recognize portion sizes, you will be able to correctly estimate the amount of total carbohydrate you are allowed to eat at the restaurant. Ask for a takeout box if the portion is more than you should have. When your food comes, leave the amount you should have on the plate, and put the rest in the takeout box before you start eating.  Plan ahead if your mealtime will be different from usual. Check with your caregiver to find out how to time meals and medicine if you are taking insulin.  Avoid high-fat foods, such as fried foods, cream sauces, high-fat salad dressings, or any added butter or margarine.  Do not be afraid to ask questions. Ask your server about the portion size, cooking methods, ingredients and if items can be substituted. Restaurants do not list all available items on the menu. You can ask for your main entree to be prepared using skim milk, oil instead of butter or margarine, and without gravy or sauces. Ask your waiter or waitress to serve salad dressings, gravy, sauces, margarine, and sour cream on the side. You can then add the amount your meal plan suggests.  Add more vegetables whenever possible.  Avoid items that are labeled "jumbo," "giant," "deluxe," or "supersized."  You may want to split an entre with someone and order an extra side salad.  Watch for hidden calories in foods like croutons,  bacon, or cheese.  Ask your server to take away the bread basket or chips from your table.  Order a dinner salad as an appetizer. You can eat most foods served in a restaurant. Some foods are better choices than others. Breads and Starches  Recommended: All kinds of bread (wheat, rye, white, oatmeal, [place], [place], raisin), hard or soft dinner rolls, frankfurter or hamburger buns, small bagels, small corn or whole-wheat flour tortillas.  Avoid: Frosted or glazed breads, butter rolls, egg or cheese breads, croissants, sweet rolls, pastries, coffee cake, glazed or frosted doughnuts, muffins. Crackers  Recommended: Animal crackers, graham, rye, saltine, oyster, and matzoth crackers. Bread sticks, melba toast, rusks, pretzels, popcorn (without fat), zwieback toast.  Avoid: High-fat snack crackers or chips. Buttered popcorn. Cereals  Recommended: Hot and cold cereals. Whole grains such as oatmeal or shredded wheat are good choices.  Avoid: Sugar-coated or granola type cereals. Potatoes/Pasta/Rice/Beans  Recommended: Order baked, boiled, or mashed potatoes, rice or noodles without added fat, whole beans. Order gravies, butter, margarine, or sauces on the side so you can control the amount you add.  Avoid: Hash browns or fried potatoes. Potatoes, pasta, or rice prepared with cream  or cheese sauce. Potato or pasta salads prepared with large amounts of dressing. Fried beans or fried rice. Vegetables  Recommended: Order steamed, baked, boiled, or stewed vegetables without sauces or extra fat. Ask that sauce be served on the side. If vegetables are not listed on the menu, ask what is available.  Avoid: Vegetables prepared with cream, butter, or cheese sauce. Fried vegetables. Salad Bars  Recommended: Many of the vegetables at a salad bar are considered "free." Use lemon juice, vinegar, or low-calorie salad dressing (fewer than 20 calories per serving) as "free" dressings for your salad. Look  for salad bar ingredients that have no added fat or sugar such as tomatoes, lettuce, cucumbers, broccoli, carrots, onions, and mushrooms.  Avoid: Prepared salads with large amounts of dressing, such as coleslaw, caesar salad, macaroni salad, bean salad, or carrot salad. Fruit  Recommended: Eat fresh fruit or fresh fruit salad without added dressing. A salad bar often offers fresh fruit choices, but canned fruit at a restaurant is usually packed in sugar or syrup.  Avoid: Sweetened canned or frozen fruits, plain or sweetened fruit juice. Fruit salads with dressing, sour cream, or sugar added to them. Meat and Meat Substitutes  Recommended: Order broiled, baked, roasted, or grilled meat, poultry, or fish. Trim off all visible fat. Do not eat the skin of poultry. The size stated on the menu is the raw weight. Meat shrinks by  in cooking (for example, 4 oz raw equals 3 oz cooked meat).  Avoid: Deep-fat fried meat, poultry, or fish. Breaded meats. Eggs  Recommended: Order soft, hard-cooked, poached, or scrambled eggs. Omelets may be okay, depending on what ingredients are added. Egg substitutes are also a good choice.  Avoid: Fried eggs, eggs prepared with cream or cheese sauce. Milk  Recommended: Order low-fat or fat-free milk according to your meal plan. Plain, nonfat yogurt or flavored yogurt with no sugar added may be used as a substitute for milk. Soy milk may also be used.  Avoid: Milk shakes or sweetened milk beverages. Soups and Combination Foods  Recommended: Clear broth or consomm are "free" foods and may be used as an appetizer. Broth-based soups with fat removed count as a starch serving and are preferred over cream soups. Soups made with beans or split peas may be eaten but count as a starch.  Avoid: Fatty soups, soup made with cream, cheese soup. Combination foods prepared with excessive amounts of fat or with cream or cheese sauces. Desserts and Sweets  Recommended: Ask for  fresh fruit. Sponge or angel food cake without icing, ice milk, no sugar added ice cream, sherbet, or frozen yogurt may fit into your meal plan occasionally.  Avoid: Pastries, puddings, pies, cakes with icing, custard, gelatin desserts. Fats and Oils  Recommended: Choose healthy fats such as olive oil, canola oil, or tub margarine, reduced fat or fat-free sour cream, cream cheese, avocado, or nuts.  Avoid: Any fats in excess of your allowed portion. Deep-fried foods or any food with a large amount of fat. Note: Ask for all fats to be served on the side, and limit your portion sizes according to your meal plan. Document Released: 05/14/2005 Document Revised: 08/06/2011 Document Reviewed: 12/02/2008 [facility] Patient Information 2013 [place], [place].   Diabetes Meal Planning Guide The diabetes meal planning guide is a tool to help you plan your meals and snacks. It is important for people with diabetes to manage their blood glucose (sugar) levels. Choosing the right foods and the right amounts throughout your  day will help control your blood glucose. Eating right can even help you improve your blood pressure and reach or maintain a healthy weight. CARBOHYDRATE COUNTING MADE EASY When you eat carbohydrates, they turn to sugar. This raises your blood glucose level. Counting carbohydrates can help you control this level so you feel better. When you plan your meals by counting carbohydrates, you can have more flexibility in what you eat and balance your medicine with your food intake. Carbohydrate counting simply means adding up the total amount of carbohydrate grams in your meals and snacks. Try to eat about the same amount at each meal. Foods with carbohydrates are listed below. Each portion below is 1 carbohydrate serving or 15 grams of carbohydrates. Ask your dietician how many grams of carbohydrates you should eat at each meal or snack. Grains and Starches  1 slice bread.   English muffin or  hotdog/hamburger bun.   cup cold cereal (unsweetened).   cup cooked pasta or rice.   cup starchy vegetables (corn, potatoes, peas, beans, winter squash).  1 tortilla (6 inches).   bagel.  1 waffle or pancake (size of a CD).   cup cooked cereal.  4 to 6 small crackers. *Whole grain is recommended. Fruit  1 cup fresh unsweetened berries, melon, papaya, pineapple.  1 small fresh fruit.   banana or mango.   cup fruit juice (4 oz unsweetened).   cup canned fruit in natural juice or water.  2 tbs dried fruit.  12 to 15 grapes or cherries. Milk and Yogurt  1 cup fat-free or 1% milk.  1 cup soy milk.  6 oz light yogurt with sugar-free sweetener.  6 oz low-fat soy yogurt.  6 oz plain yogurt. Vegetables  1 cup raw or  cup cooked is counted as 0 carbohydrates or a "free" food.  If you eat 3 or more servings at 1 meal, count them as 1 carbohydrate serving. Other Carbohydrates   oz chips or pretzels.   cup ice cream or frozen yogurt.   cup sherbet or sorbet.  2 inch square cake, no frosting.  1 tbs honey, sugar, jam, jelly, or syrup.  2 small cookies.  3 squares of graham crackers.  3 cups popcorn.  6 crackers.  1 cup broth-based soup.  Count 1 cup casserole or other mixed foods as 2 carbohydrate servings.  Foods with less than 20 calories in a serving may be counted as 0 carbohydrates or a "free" food. You may want to purchase a book or computer software that lists the carbohydrate gram counts of different foods. In addition, the nutrition facts panel on the labels of the foods you eat are a good source of this information. The label will tell you how big the serving size is and the total number of carbohydrate grams you will be eating per serving. Divide this number by 15 to obtain the number of carbohydrate servings in a portion. Remember, 1 carbohydrate serving equals 15 grams of carbohydrate. SERVING SIZES Measuring foods and serving  sizes helps you make sure you are getting the right amount of food. The list below tells how big or small some common serving sizes are.  1 oz.........4 stacked dice.  3 oz........Marland KitchenDeck of cards.  1 tsp.......Marland KitchenTip of little finger.  1 tbs......Marland KitchenMarland KitchenThumb.  2 tbs.......Marland KitchenGolf ball.   cup......Marland KitchenHalf of a fist.  1 cup.......Marland KitchenA fist. SAMPLE DIABETES MEAL PLAN Below is a sample meal plan that includes foods from the grain and starches, dairy, vegetable, fruit, and meat  groups. A dietician can individualize a meal plan to fit your calorie needs and tell you the number of servings needed from each food group. However, controlling the total amount of carbohydrates in your meal or snack is more important than making sure you include all of the food groups at every meal. You may interchange carbohydrate containing foods (dairy, starches, and fruits). The meal plan below is an example of a 2000 calorie diet using carbohydrate counting. This meal plan has 17 carbohydrate servings. Breakfast  1 cup oatmeal (2 carb servings).   cup light yogurt (1 carb serving).  1 cup blueberries (1 carb serving).   cup almonds. Snack  1 large apple (2 carb servings).  1 low-fat string cheese stick. Lunch  Chicken breast salad.  1 cup spinach.   cup chopped tomatoes.  2 oz chicken breast, sliced.  2 tbs low-fat [place] dressing.  12 whole-wheat crackers (2 carb servings).  12 to 15 grapes (1 carb serving).  1 cup low-fat milk (1 carb serving). Snack  1 cup carrots.   cup hummus (1 carb serving). Dinner  3 oz broiled salmon.  1 cup brown rice (3 carb servings). Snack  1  cups steamed broccoli (1 carb serving) drizzled with 1 tsp olive oil and lemon juice.  1 cup light pudding (2 carb servings). DIABETES MEAL PLANNING WORKSHEET Your dietician can use this worksheet to help you decide how many servings of foods and what types of foods are right for you.  BREAKFAST Food Group and  Servings / Carb Servings Grain/Starches __________________________________ Dairy __________________________________________ Vegetable ______________________________________ Fruit ___________________________________________ Meat __________________________________________ Fat ____________________________________________ LUNCH Food Group and Servings / Carb Servings Grain/Starches ___________________________________ Dairy ___________________________________________ Fruit ____________________________________________ Meat ___________________________________________ Fat _____________________________________________ Laural Golden Food Group and Servings / Carb Servings Grain/Starches ___________________________________ Dairy ___________________________________________ Fruit ____________________________________________ Meat ___________________________________________ Fat _____________________________________________ SNACKS Food Group and Servings / Carb Servings Grain/Starches ___________________________________ Dairy ___________________________________________ Vegetable _______________________________________ Fruit ____________________________________________ Meat ___________________________________________ Fat _____________________________________________ DAILY TOTALS Starches _________________________ Vegetable ________________________ Fruit ____________________________ Dairy ____________________________ Meat ____________________________ Fat ______________________________ Document Released: 02/08/2005 Document Revised: 08/06/2011 Document Reviewed: 12/20/2008 [employer] Patient Information 2013 [place], [place].  Diabetes and Standards of Medical Care  Diabetes is complicated. You may find that your diabetes team includes a dietitian, nurse, diabetes educator, eye doctor, and more. To help everyone know what is going on and to help you get the care you deserve, the following schedule of care  was developed to help keep you on track. Below are the tests, exams, vaccines, medicines, education, and plans you will need. A1c test  Performed at least 2 times a year if you are meeting treatment goals.  Performed 4 times a year if therapy has changed or if you are not meeting therapy/glycemic goals. Aspirin medicine  Take daily as directed by your caregiver. Blood pressure test  Performed at every routine medical visit. The goal is less than 130/80 mm/Hg. Dental exam  Get a dental exam at least 2 times a year. Dilated eye exam (retinal exam)  Type 1 diabetes: Get an exam within 5 years of diagnosis and then yearly.  Type 2 diabetes: Get an exam at diagnosis and then yearly. All exams thereafter can be extended to every 2 to 3 years if one or more exams have been normal. Foot care exam  Visual foot exams are performed at every routine medical visit. The exams check for cuts, injuries, or other problems with the feet.  A comprehensive foot exam should be done yearly. This includes visual inspection  as well as assessing foot pulses and testing for loss of sensation. Kidney function test (urine microalbumin)  Performed once a year.  Type 1 diabetes: The first test is performed 5 years after diagnosis.  Type 2 diabetes: The first test is performed at the time of diagnosis.  A serum creatinine and estimated glomerular filtration rate (eGFR) test is done once a year to tell the level of chronic kidney disease (CKD), if present. Lipid profile (Cholesterol, HDL, LDL, Triglycerides)  Performed once a year for most people. If at low risk, may be assessed every 2 years.  The goal for LDL is less than 100 mg/dl. If at high risk, the goal is less than 70 mg/dl.  The goal for HDL is higher than 40 mg/dl for men and higher than 50 mg/dl for women.  The goal for triglycerides is less than 150 mg/dl. Flu vaccine, pneumonia vaccine, and hepatitis B vaccine  The flu vaccine is  recommended yearly.  The pneumonia vaccine is generally given once in a lifetime. However, there are some instances where another vaccine is recommended. Check with your caregiver.  The hepatitis B vaccine is also recommended for adults with diabetes. Diabetes self-management education  Recommended at diagnosis and ongoing as needed. Treatment plan  Reviewed at every medical visit. Document Released: 03/11/2009 Document Revised: 08/06/2011 Document Reviewed: 11/14/2010 [facility] Patient Information 2013 [place], [place].

## 2012-06-04 NOTE — Progress Notes (Signed)
Subjective:    Patient ID: Connie [name], female    DOB: 10/04/1960, 52 y.o.   MRN: [id number] Chief Complaint  Patient presents with  . Otalgia    Right ear x 4-5 days  . Nausea    Was seen over the weekend at [facility] for GI sxs  . Tinea    On her lower abd and under her breasts    HPI  Connie [name] is a 52 yo woman who presents with a variety of concerns.  Just haven't felt well or like herself.  Thinks this is all stress related.  Also concerned that the topical clobetasol spray she is using on her scalp for her psoriasis from her derm could have had systemic absorption and triggered things. This past wkend, pt caught a GI bug from her daughter this past wkend - vomiting, diarrhea, abd pain, presyncope.  Was seen in ER treated w/ iv and nausea med on Sat. Since then sxs slowly resolving, did not fill nausea med she was rxed as feeling better. Diarrhea totally resolved but still feels bloated, occ abd pains, and increased bowel sounds.  Ate her first real meal today - steak and cheese sandwich.  Drinking a lot of fluids. Right ear was a little painful this wkend but has worsened and now radiating into jaw  Now w/ recurrent yeast infection under breasts and beneath panus with new chafing and feels like she is developing bumps outside her vagina.  She had to shave before her derm appt so all moles could be seen.  Prev treated to oral diflucan and topcial nystop powder.  Has never had these recurrent yeast skin problems before recently.   At last visit was diagnosed w/ hypothyroid and feeling better since starting on levothyroxine, edema decreasing but still with increased weight.  No nosebleeds but right nostril w/ bloody membranes a lot 2 wks ago was getting tense through forehead and right blepharospasm.  Past Medical History  Diagnosis Date  . Allergic rhinitis   . Hyperlipidemia   . Hypertension   . Lower extremity edema   . Hypothyroid    Current Outpatient Prescriptions on File Prior  to Visit  Medication Sig Dispense Refill  . aspirin EC 81 MG tablet Take 81 mg by mouth daily.       . hydrochlorothiazide (HYDRODIURIL) 25 MG tablet Take 1 tablet (25 mg total) by mouth daily.  30 tablet  1  . levothyroxine (SYNTHROID, LEVOTHROID) 50 MCG tablet Take 1 tablet (50 mcg total) by mouth daily.  90 tablet  3  . metFORMIN (GLUCOPHAGE) 500 MG tablet Take 1 tablet (500 mg total) by mouth daily with breakfast.  90 tablet  0    Review of Systems  Constitutional: Positive for activity change, appetite change and fatigue. Negative for fever, chills, diaphoresis and unexpected weight change.  HENT: Positive for ear pain, congestion, rhinorrhea, dental problem and sinus pressure. Negative for hearing loss, nosebleeds, sore throat, trouble swallowing and ear discharge.   Cardiovascular: Positive for leg swelling.  Gastrointestinal: Positive for nausea, abdominal pain and abdominal distention. Negative for vomiting, diarrhea and constipation.  Genitourinary: Negative for dysuria, decreased urine volume, vaginal discharge and pelvic pain.  Skin: Positive for rash.  Neurological: Positive for headaches. Negative for syncope, weakness and light-headedness.  Hematological: Positive for adenopathy.  Psychiatric/Behavioral: The patient is nervous/anxious.       BP 138/82  Pulse 72  Temp 98.3 F (36.8 C)  Resp 16  Ht 5' 6.5" (1.689  m)  Wt 235 lb (106.595 kg)  BMI 37.36 kg/m2 Objective:   Physical Exam  Constitutional: She is oriented to person, place, and time. She appears well-developed and well-nourished. No distress.  HENT:  Head: Normocephalic and atraumatic.  Right Ear: There is tenderness. Tympanic membrane is injected, erythematous and retracted. Tympanic membrane is not bulging. A middle ear effusion is present.  Left Ear: Tympanic membrane, external ear and ear canal normal.  Nose: Mucosal edema and rhinorrhea present. Right sinus exhibits maxillary sinus tenderness and frontal  sinus tenderness. Left sinus exhibits frontal sinus tenderness. Left sinus exhibits no maxillary sinus tenderness.  Mouth/Throat: Uvula is midline and mucous membranes are normal. Posterior oropharyngeal erythema present. No oropharyngeal exudate, posterior oropharyngeal edema or tonsillar abscesses.       Right canal erythematous  Eyes: Conjunctivae normal are normal. Right eye exhibits no discharge. Left eye exhibits no discharge. No scleral icterus.  Neck: Normal range of motion. Neck supple.  Cardiovascular: Normal rate, regular rhythm, normal heart sounds and intact distal pulses.   Pulmonary/Chest: Effort normal and breath sounds normal.  Abdominal: Soft. Bowel sounds are normal. She exhibits no distension and no mass. There is generalized tenderness. There is no rebound, no guarding and negative Murphy's sign.  Lymphadenopathy:       Head (right side): Submandibular adenopathy present. No preauricular and no posterior auricular adenopathy present.       Head (left side): Submandibular adenopathy present. No preauricular and no posterior auricular adenopathy present.    She has no cervical adenopathy.       Right: No supraclavicular adenopathy present.       Left: No supraclavicular adenopathy present.  Neurological: She is alert and oriented to person, place, and time.  Skin: Skin is warm and dry. Rash noted. Rash is macular. She is not diaphoretic.       Erythematous raw macular rash with surrounding scale and few satellite lesions under bilateral breasts, inguinal creases and bilateral perineum along upper inner thigh.  Few papules and pustules along mons and labia majora.  Psychiatric: She has a normal mood and affect. Her behavior is normal.      Results for orders placed in visit on 06/04/12  POCT CBC      Component Value Range   WBC 7.1  4.6 - 10.2 K/uL   Lymph, poc 2.2  0.6 - 3.4   POC LYMPH PERCENT 31.5  10 - 50 %L   MID (cbc) 0.7  0 - 0.9   POC MID % 9.9  0 - 12 %M   POC  Granulocyte 4.2  2 - 6.9   Granulocyte percent 58.6  37 - 80 %G   RBC 4.95  4.04 - 5.48 M/uL   Hemoglobin 14.1  12.2 - 16.2 g/dL   HCT, POC 16.1  09.6 - 47.9 %   MCV 90.0  80 - 97 fL   MCH, POC 28.5  27 - 31.2 pg   MCHC 31.6 (*) 31.8 - 35.4 g/dL   RDW, POC 04.5     Platelet Count, POC 275  142 - 424 K/uL   MPV 10.3  0 - 99.8 fL  POCT GLYCOSYLATED HEMOGLOBIN (HGB A1C)      Component Value Range   Hemoglobin A1C 6.7      Assessment & Plan:  1. DM - new diagnosis.  Rx given for glucometer, strips, and lancets.  Start low dose metformin 500mg  qd in several days after sxs from viral gastroenteritis completely  resolve. Will titrate metformin up quickly as tolerated in hopes of helping w/ weightloss and strongly consider switching to byetta or other glp1 to help w/ weightloss. Refer to nutrition for DM education. Needs flp and urine microalb at f/u and sicuss starting asa.   2. HTN - Consider adding lisinopril to hctz if bp still added at f/u 3. Hypothyroid - has not had repeat tsh since started levothyroxine 50 - recheck. 4. Derm candidiasis - diflucan 150mg  po x 1, can repeat in 3d if needed.  Start top nystatin 5. Right otitis externa - po omnicef 6. Genital folliculitis - hopefully omnicef and antifungal will help 7. Rt sinusitis - start omnicef RTC 3d for recheck to ensure sinusitis improving and answer further questions about DM.    Over 40 min spent in face to face eval w/ pt.

## 2012-06-05 LAB — TSH: TSH: 22.083 u[IU]/mL — ABNORMAL HIGH (ref 0.350–4.500)

## 2012-06-06 ENCOUNTER — Other Ambulatory Visit: Payer: Self-pay | Admitting: Family Medicine

## 2012-06-06 ENCOUNTER — Other Ambulatory Visit: Payer: Self-pay

## 2012-06-06 DIAGNOSIS — E039 Hypothyroidism, unspecified: Secondary | ICD-10-CM

## 2012-06-06 MED ORDER — LEVOTHYROXINE SODIUM 100 MCG PO TABS: 100.0000 ug | ORAL_TABLET | Freq: Every day | ORAL | Status: DC

## 2012-06-07 ENCOUNTER — Ambulatory Visit (INDEPENDENT_AMBULATORY_CARE_PROVIDER_SITE_OTHER): Payer: BC Managed Care – PPO | Admitting: Family Medicine

## 2012-06-07 VITALS — BP 132/84 | HR 82 | Temp 98.1°F | Resp 16 | Ht 66.5 in | Wt 231.8 lb

## 2012-06-07 DIAGNOSIS — E039 Hypothyroidism, unspecified: Secondary | ICD-10-CM

## 2012-06-07 DIAGNOSIS — I10 Essential (primary) hypertension: Secondary | ICD-10-CM

## 2012-06-07 DIAGNOSIS — E119 Type 2 diabetes mellitus without complications: Secondary | ICD-10-CM

## 2012-06-07 LAB — LIPID PANEL
Cholesterol: 231 mg/dL — ABNORMAL HIGH (ref 0–200)
HDL: 30 mg/dL — ABNORMAL LOW (ref >39)
LDL Cholesterol: 155 mg/dL — ABNORMAL HIGH (ref 0–99)
Total CHOL/HDL Ratio: 7.7 Ratio
Triglycerides: 231 mg/dL — ABNORMAL HIGH (ref <150)
VLDL: 46 mg/dL — ABNORMAL HIGH (ref 0–40)

## 2012-06-07 LAB — MICROALBUMIN, URINE: Microalb, Ur: 1.09 mg/dL (ref 0.00–1.89)

## 2012-06-07 MED ORDER — HYDROCHLOROTHIAZIDE 25 MG PO TABS: 25.0000 mg | ORAL_TABLET | Freq: Every day | ORAL | Status: DC

## 2012-06-07 NOTE — Progress Notes (Signed)
Subjective:    Patient ID: Connie [name], female    DOB: [birth year], 52 y.o.   MRN: [id number] Chief Complaint  Patient presents with  . Follow-up    She is fasting    HPI  Connie [name] is feeling immensely better. It was hard news to be diagnosed w/ DM several days ago but explains why she was getting so many yeast skin infections and feeling so bad.  She is going to try very hard to diet and exercise and plans to get rid of the DM.  She also thinks she is already feeling better with increasing her synthroid dose and is encouraged to know that getting her thyroid replacement increased should help control her sugars and lipids. Doing fine on the new metformin - some diarrhea but not bothering her. TOlerating the new pravachol fine.  Even sinus infection is getting better. Past Medical History  Diagnosis Date  . Allergic rhinitis   . Hyperlipidemia   . Hypertension   . Lower extremity edema   . Hypothyroid    Current Outpatient Prescriptions on File Prior to Visit  Medication Sig Dispense Refill  . aspirin EC 81 MG tablet Take 81 mg by mouth daily.       . cefdinir (OMNICEF) 300 MG capsule Take 1 capsule (300 mg total) by mouth 2 (two) times daily.  20 capsule  0  . fluconazole (DIFLUCAN) 150 MG tablet Take 1 tablet (150 mg total) by mouth once. Repeat if needed  2 tablet  0  . levothyroxine (SYNTHROID, LEVOTHROID) 100 MCG tablet Take 1 tablet (100 mcg total) by mouth daily.  30 tablet  1  . metFORMIN (GLUCOPHAGE) 500 MG tablet Take 1 tablet (500 mg total) by mouth daily with breakfast.  90 tablet  0  . nystatin (MYCOSTATIN) powder Apply topically 4 (four) times daily.  60 g  5  . pravastatin (PRAVACHOL) 40 MG tablet Take 1 tablet (40 mg total) by mouth at bedtime.  90 tablet  1   Allergies  Allergen Reactions  . Codeine Other (See Comments)    incoherent  . Niacin Other (See Comments)    Turns red from chest up. Body hot.    Review of Systems  Constitutional: Positive for fatigue.  Negative for fever, chills, diaphoresis and appetite change.  Eyes: Negative for visual disturbance.  Respiratory: Negative for cough and shortness of breath.   Cardiovascular: Negative for chest pain, palpitations and leg swelling.  Genitourinary: Negative for decreased urine volume.  Neurological: Negative for syncope and headaches.  Hematological: Does not bruise/bleed easily.      BP 132/84  Pulse 82  Temp 98.1 F (36.7 C) (Oral)  Resp 16  Ht 5' 6.5" (1.689 m)  Wt 231 lb 12.8 oz (105.144 kg)  BMI 36.85 kg/m2  SpO2 98% Objective:   Physical Exam  Constitutional: She is oriented to person, place, and time. She appears well-developed and well-nourished. No distress.  HENT:  Head: Normocephalic and atraumatic.  Right Ear: External ear normal.  Left Ear: External ear normal.  Eyes: Conjunctivae normal are normal. No scleral icterus.  Neck: Normal range of motion. Neck supple. No thyromegaly present.  Cardiovascular: Normal rate, regular rhythm, normal heart sounds and intact distal pulses.   Pulmonary/Chest: Effort normal and breath sounds normal. No respiratory distress.  Musculoskeletal: She exhibits no edema.  Lymphadenopathy:    She has no cervical adenopathy.  Neurological: She is alert and oriented to person, place, and time.  Skin: Skin is  warm and dry. She is not diaphoretic. No erythema.  Psychiatric: She has a normal mood and affect. Her behavior is normal.      Assessment & Plan:    1. DM (diabetes mellitus) Increase metformin from 500 qam to bid if tolerated - will try to push up metformin as high as tolerated to help w/ weightloss Lipid panel, Microalbumin, urine, Ambulatory referral to Ophthalmology  2. HPL - doing fine on pravastatin - recheck alt in 6 wks then needs flp in 4-6 mos   3. Hypertension -  Mildly elevated - start hctz and will hopefully be able to wean off of it soon along w/ metformin and pravastatin.   4. Hypothyroid - Recheck tsh in 6 wks as  synthroid increased few days prev    Meds ordered this encounter  Medications         . hydrochlorothiazide (HYDRODIURIL) 25 MG tablet    Sig: Take 1 tablet (25 mg total) by mouth daily.    Dispense:  90 tablet    Refill:  3

## 2012-06-08 ENCOUNTER — Other Ambulatory Visit: Payer: Self-pay | Admitting: Family Medicine

## 2012-06-08 DIAGNOSIS — E785 Hyperlipidemia, unspecified: Secondary | ICD-10-CM

## 2012-06-08 MED ORDER — PRAVASTATIN SODIUM 40 MG PO TABS: 40.0000 mg | ORAL_TABLET | Freq: Every day | ORAL | Status: DC

## 2012-06-28 ENCOUNTER — Telehealth: Payer: Self-pay

## 2012-06-28 NOTE — Telephone Encounter (Signed)
BP MEDICATION NOT AT PHARMACY YET

## 2012-06-30 NOTE — Telephone Encounter (Signed)
This message not clear. Called patient what med? What pharmacy? Left message for her to call me back.

## 2012-07-01 NOTE — Telephone Encounter (Signed)
Spoke to patient to advise, HCTZ this was sent to [facility] pharmacy patient has d/c the cholesterol meds it made her joints painful. I have added this to her allergy list/ to you [facility]

## 2012-07-04 ENCOUNTER — Ambulatory Visit (INDEPENDENT_AMBULATORY_CARE_PROVIDER_SITE_OTHER): Payer: BC Managed Care – PPO | Admitting: Family Medicine

## 2012-07-04 ENCOUNTER — Encounter: Payer: Self-pay | Admitting: Family Medicine

## 2012-07-04 VITALS — BP 120/92 | HR 67 | Temp 98.1°F | Resp 16 | Ht 66.5 in | Wt 225.2 lb

## 2012-07-04 DIAGNOSIS — Z23 Encounter for immunization: Secondary | ICD-10-CM

## 2012-07-04 DIAGNOSIS — L0390 Cellulitis, unspecified: Secondary | ICD-10-CM

## 2012-07-04 DIAGNOSIS — E785 Hyperlipidemia, unspecified: Secondary | ICD-10-CM

## 2012-07-04 DIAGNOSIS — I10 Essential (primary) hypertension: Secondary | ICD-10-CM

## 2012-07-04 DIAGNOSIS — E039 Hypothyroidism, unspecified: Secondary | ICD-10-CM

## 2012-07-04 DIAGNOSIS — E119 Type 2 diabetes mellitus without complications: Secondary | ICD-10-CM

## 2012-07-04 LAB — TSH: TSH: 1.668 u[IU]/mL (ref 0.350–4.500)

## 2012-07-04 MED ORDER — GLUCOSE BLOOD VI STRP: ORAL_STRIP | Status: DC

## 2012-07-04 MED ORDER — CEPHALEXIN 500 MG PO CAPS: 500.0000 mg | ORAL_CAPSULE | Freq: Four times a day (QID) | ORAL | Status: DC

## 2012-07-04 NOTE — Progress Notes (Signed)
  Subjective:    Patient ID: Connie [name], female    DOB: [birth year], 52 y.o.   MRN: [id number] Chief Complaint  Patient presents with  . Diabetes  . Hypothyroidism  . Rash    left ankle up leg x 4 days    HPI   Was having  cbgs in a.m. Has been between 80-90 - she knows she has eaten to much she gets 102 and up to 135 during the day  Current Outpatient Prescriptions on File Prior to Visit  Medication Sig Dispense Refill  . aspirin EC 81 MG tablet Take 81 mg by mouth daily.       . hydrochlorothiazide (HYDRODIURIL) 25 MG tablet Take 1 tablet (25 mg total) by mouth daily.  90 tablet  3  . levothyroxine (SYNTHROID, LEVOTHROID) 100 MCG tablet Take 1 tablet (100 mcg total) by mouth daily.  30 tablet  1  . metFORMIN (GLUCOPHAGE) 500 MG tablet Take 1 tablet (500 mg total) by mouth daily with breakfast.  90 tablet  0  . Probiotic Product (PROBIOTIC ACIDOPHILUS) CAPS Take by mouth.      . nystatin (MYCOSTATIN) powder Apply topically 4 (four) times daily.  60 g  5     Review of Systems    BP 120/92  Pulse 67  Temp 98.1 F (36.7 C) (Oral)  Resp 16  Ht 5' 6.5" (1.689 m)  Wt 225 lb 3.2 oz (102.15 kg)  BMI 35.80 kg/m2  SpO2 97% Objective:   Physical Exam        Assessment & Plan:  Cellulitis - Plan: cephALEXin (KEFLEX) 500 MG capsule  HYPERLIPIDEMIA  HYPERTENSION  Hypothyroid - Plan: TSH, levothyroxine (SYNTHROID, LEVOTHROID) 100 MCG tablet  Diabetes mellitus - Plan: glucose blood test strip  Need for prophylactic vaccination with combined diphtheria-tetanus-pertussis (DTP) vaccine - Plan: Tdap vaccine greater than or equal to 7yo IM  Meds ordered this encounter  Medications  . cephALEXin (KEFLEX) 500 MG capsule    Sig: Take 1 capsule (500 mg total) by mouth 4 (four) times daily.    Dispense:  40 capsule    Refill:  0  . glucose blood test strip    Sig: Use as instructed to check cbgs. [facility] Truetest Strips    Dispense:  100 each    Refill:  12  .  levothyroxine (SYNTHROID, LEVOTHROID) 100 MCG tablet    Sig: Take 1 tablet (100 mcg total) by mouth daily.    Dispense:  90 tablet    Refill:  1

## 2012-07-05 MED ORDER — LEVOTHYROXINE SODIUM 100 MCG PO TABS: 100.0000 ug | ORAL_TABLET | Freq: Every day | ORAL | Status: DC

## 2012-07-14 ENCOUNTER — Telehealth: Payer: Self-pay

## 2012-07-14 NOTE — Telephone Encounter (Signed)
I had received a prior auth request for pt's True test glucose test strips. Called ins and was told that their preferred strips are the [employer] Contour Next testing strips and pt would have co-pay of #17.23/box of 100. Ins stated that the meter is considered OTC though and ins does not cover any of the meters.  LMOM for pt to CB to discuss what she would like to do.

## 2012-07-14 NOTE — Telephone Encounter (Signed)
Pt CB and I explained situation w/ins coverage of testing strips/meter. Pt stated she will contact her pharmacy, which she wants to change to [facility] in [place], to check cost of [employer]. Pt will CB w/instr's as to what Rx she would like to be sent in for her.

## 2012-07-25 NOTE — Telephone Encounter (Signed)
Patient advised if she still needs to have meter and test strips sent in she should call and let me know

## 2012-08-21 ENCOUNTER — Ambulatory Visit (INDEPENDENT_AMBULATORY_CARE_PROVIDER_SITE_OTHER): Payer: BC Managed Care – PPO | Admitting: Family Medicine

## 2012-08-21 VITALS — BP 156/98 | HR 80 | Temp 97.9°F | Resp 16 | Ht 66.5 in | Wt 223.6 lb

## 2012-08-21 DIAGNOSIS — J309 Allergic rhinitis, unspecified: Secondary | ICD-10-CM

## 2012-08-21 DIAGNOSIS — R300 Dysuria: Secondary | ICD-10-CM

## 2012-08-21 DIAGNOSIS — R350 Frequency of micturition: Secondary | ICD-10-CM

## 2012-08-21 DIAGNOSIS — N390 Urinary tract infection, site not specified: Secondary | ICD-10-CM

## 2012-08-21 LAB — POCT URINALYSIS DIPSTICK
Bilirubin, UA: NEGATIVE
Glucose, UA: NEGATIVE
Ketones, UA: NEGATIVE
Nitrite, UA: NEGATIVE
Protein, UA: NEGATIVE
Spec Grav, UA: 1.025
Urobilinogen, UA: 0.2
pH, UA: 6

## 2012-08-21 LAB — POCT UA - MICROSCOPIC ONLY
Casts, Ur, LPF, POC: NEGATIVE
Crystals, Ur, HPF, POC: NEGATIVE
Yeast, UA: NEGATIVE

## 2012-08-21 MED ORDER — NITROFURANTOIN MONOHYD MACRO 100 MG PO CAPS: 100.0000 mg | ORAL_CAPSULE | Freq: Two times a day (BID) | ORAL | Status: DC

## 2012-08-21 MED ORDER — IPRATROPIUM BROMIDE 0.06 % NA SOLN: 2.0000 | Freq: Four times a day (QID) | NASAL | Status: DC

## 2012-08-21 MED ORDER — PHENAZOPYRIDINE HCL 100 MG PO TABS: 100.0000 mg | ORAL_TABLET | Freq: Three times a day (TID) | ORAL | Status: DC | PRN

## 2012-08-21 NOTE — Patient Instructions (Signed)
Start the antibiotic, pyridium if needed.  Start claritin (store brand ok) once per day for allergies and nasal spray if needed.  Return to the clinic or go to the nearest emergency room if any of your symptoms worsen or new symptoms occur.  Allergic Rhinitis Allergic rhinitis is when the mucous membranes in the nose respond to allergens. Allergens are particles in the air that cause your body to have an allergic reaction. This causes you to release allergic antibodies. Through a chain of events, these eventually cause you to release histamine into the blood stream (hence the use of antihistamines). Although meant to be protective to the body, it is this release that causes your discomfort, such as frequent sneezing, congestion and an itchy runny nose.  CAUSES  The pollen allergens may come from grasses, trees, and weeds. This is seasonal allergic rhinitis, or "hay fever." Other allergens cause year-round allergic rhinitis (perennial allergic rhinitis) such as house dust mite allergen, pet dander and mold spores.  SYMPTOMS   Nasal stuffiness (congestion).  Runny, itchy nose with sneezing and tearing of the eyes.  There is often an itching of the mouth, eyes and ears. It cannot be cured, but it can be controlled with medications. DIAGNOSIS  If you are unable to determine the offending allergen, skin or blood testing may find it. TREATMENT   Avoid the allergen.  Medications and allergy shots (immunotherapy) can help.  Hay fever may often be treated with antihistamines in pill or nasal spray forms. Antihistamines block the effects of histamine. There are over-the-counter medicines that may help with nasal congestion and swelling around the eyes. Check with your caregiver before taking or giving this medicine. If the treatment above does not work, there are many new medications your caregiver can prescribe. Stronger medications may be used if initial measures are ineffective. Desensitizing  injections can be used if medications and avoidance fails. Desensitization is when a patient is given ongoing shots until the body becomes less sensitive to the allergen. Make sure you follow up with your caregiver if problems continue. SEEK MEDICAL CARE IF:   You develop fever (more than 100.5 F (38.1 C).  You develop a cough that does not stop easily (persistent).  You have shortness of breath.  You start wheezing.  Symptoms interfere with normal daily activities. Document Released: 02/06/2001 Document Revised: 08/06/2011 Document Reviewed: 08/18/2008 [facility] Patient Information 2013 [place], [place].   Urinary Tract Infection Urinary tract infections (UTIs) can develop anywhere along your urinary tract. Your urinary tract is your body's drainage system for removing wastes and extra water. Your urinary tract includes two kidneys, two ureters, a bladder, and a urethra. Your kidneys are a pair of bean-shaped organs. Each kidney is about the size of your fist. They are located below your ribs, one on each side of your spine. CAUSES Infections are caused by microbes, which are microscopic organisms, including fungi, viruses, and bacteria. These organisms are so small that they can only be seen through a microscope. Bacteria are the microbes that most commonly cause UTIs. SYMPTOMS  Symptoms of UTIs may vary by age and gender of the patient and by the location of the infection. Symptoms in young women typically include a frequent and intense urge to urinate and a painful, burning feeling in the bladder or urethra during urination. Older women and men are more likely to be tired, shaky, and weak and have muscle aches and abdominal pain. A fever may mean the infection is in your kidneys.  Other symptoms of a kidney infection include pain in your back or sides below the ribs, nausea, and vomiting. DIAGNOSIS To diagnose a UTI, your caregiver will ask you about your symptoms. Your caregiver also will  ask to provide a urine sample. The urine sample will be tested for bacteria and white blood cells. White blood cells are made by your body to help fight infection. TREATMENT  Typically, UTIs can be treated with medication. Because most UTIs are caused by a bacterial infection, they usually can be treated with the use of antibiotics. The choice of antibiotic and length of treatment depend on your symptoms and the type of bacteria causing your infection. HOME CARE INSTRUCTIONS  If you were prescribed antibiotics, take them exactly as your caregiver instructs you. Finish the medication even if you feel better after you have only taken some of the medication.  Drink enough water and fluids to keep your urine clear or pale yellow.  Avoid caffeine, tea, and carbonated beverages. They tend to irritate your bladder.  Empty your bladder often. Avoid holding urine for long periods of time.  Empty your bladder before and after sexual intercourse.  After a bowel movement, women should cleanse from front to back. Use each tissue only once. SEEK MEDICAL CARE IF:   You have back pain.  You develop a fever.  Your symptoms do not begin to resolve within 3 days. SEEK IMMEDIATE MEDICAL CARE IF:   You have severe back pain or lower abdominal pain.  You develop chills.  You have nausea or vomiting.  You have continued burning or discomfort with urination. MAKE SURE YOU:   Understand these instructions.  Will watch your condition.  Will get help right away if you are not doing well or get worse. Document Released: 02/21/2005 Document Revised: 11/13/2011 Document Reviewed: 06/22/2011 [facility] Patient Information 2013 [place], [place].

## 2012-08-21 NOTE — Progress Notes (Signed)
Subjective:    Patient ID: Connie [name], female    DOB: [birth year], 52 y.o.   MRN: [id number]  HPI Connie [name] is a 52 y.o. female  Past 4 days with urinary frequency and burning with urination.  No improvement with cranberry/ginger ale. No other new tx's.  Cloudy urine.  No recent  Slight nausea, no vomiting.  No back pain, no fever.   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  No current allergy meds.  Using otc saline ns.     Review of Systems  Constitutional: Negative for fever and chills.  HENT: Positive for congestion, rhinorrhea and sneezing.   Respiratory: Negative for cough and shortness of breath.   Gastrointestinal: Positive for abdominal pain. Negative for nausea and diarrhea.  Skin: Negative for rash.       Objective:   Physical Exam  Vitals reviewed. Constitutional: She is oriented to person, place, and time. She appears well-developed and well-nourished. No distress.  HENT:  Head: Normocephalic and atraumatic.  Right Ear: Hearing, tympanic membrane, external ear and ear canal normal.  Left Ear: Hearing, tympanic membrane, external ear and ear canal normal.  Nose: Mucosal edema present. Right sinus exhibits no maxillary sinus tenderness and no frontal sinus tenderness. Left sinus exhibits no maxillary sinus tenderness and no frontal sinus tenderness.  Mouth/Throat: Oropharynx is clear and moist. No oropharyngeal exudate.  Eyes: Conjunctivae and EOM are normal. Pupils are equal, round, and reactive to light.  Cardiovascular: Normal rate, regular rhythm, normal heart sounds and intact distal pulses.   No murmur heard. Pulmonary/Chest: Effort normal and breath sounds normal. No respiratory distress. She has no wheezes. She has no rhonchi.  Abdominal: Soft. Normal appearance. She exhibits no distension. There is tenderness in the suprapubic area. There is no rebound, no guarding and no  CVA tenderness.  Neurological: She is alert and oriented to person, place, and time.  Skin: Skin is warm and dry. No rash noted.  Psychiatric: She has a normal mood and affect. Her behavior is normal.     Results for orders placed in visit on 08/21/12  POCT UA - MICROSCOPIC ONLY      Result Value Range   WBC, Ur, HPF, POC tntc     RBC, urine, microscopic 3-5     Bacteria, U Microscopic trace     Mucus, UA trace     Epithelial cells, urine per micros 0-1     Crystals, Ur, HPF, POC neg     Casts, Ur, LPF, POC neg     Yeast, UA neg    POCT URINALYSIS DIPSTICK      Result Value Range   Color, UA yellow     Clarity, UA hazy     Glucose, UA neg     Bilirubin, UA neg     Ketones, UA neg     Spec Grav, UA 1.025     Blood, UA small     pH, UA 6.0     Protein, UA neg     Urobilinogen, UA 0.2     Nitrite, UA neg     Leukocytes, UA moderate (2+)        Assessment & Plan:  Connie [name] is a 52 y.o. female Dysuria - Plan: POCT UA - Microscopic Only, POCT urinalysis dipstick, Urine culture, phenazopyridine (PYRIDIUM) 100 MG tabletUTI (urinary tract infection) - Plan: nitrofurantoin, macrocrystal-monohydrate, (  MACROBID) 100 MG capsule, phenazopyridine (PYRIDIUM) 100 MG tabletUrinary frequency - Plan: POCT UA - Microscopic Only, POCT urinalysis dipstick, nitrofurantoin, macrocrystal-monohydrate, (MACROBID) 100 MG capsule, phenazopyridine (PYRIDIUM) 100 MG tablet.  UTi -early.  Start macrobid BID - 7days, azo, sx care, rtc precautions.  Allergic rhinitis - Plan: ipratropium (ATROVENT) 0.06 % nasal spray, claritin otc. Does not sound like sinusitis at present, but rtc precautions discussed.   Meds ordered this encounter  Medications  . nitrofurantoin, macrocrystal-monohydrate, (MACROBID) 100 MG capsule    Sig: Take 1 capsule (100 mg total) by mouth 2 (two) times daily.    Dispense:  14 capsule    Refill:  0  . phenazopyridine (PYRIDIUM) 100 MG tablet    Sig: Take 1 tablet (100 mg  total) by mouth 3 (three) times daily as needed for pain.    Dispense:  10 tablet    Refill:  0  . ipratropium (ATROVENT) 0.06 % nasal spray    Sig: Place 2 sprays into the nose 4 (four) times daily.    Dispense:  15 mL    Refill:  1   Patient Instructions  Start the antibiotic, pyridium if needed.  Start claritin (store brand ok) once per day for allergies and nasal spray if needed.  Return to the clinic or go to the nearest emergency room if any of your symptoms worsen or new symptoms occur.  Allergic Rhinitis Allergic rhinitis is when the mucous membranes in the nose respond to allergens. Allergens are particles in the air that cause your body to have an allergic reaction. This causes you to release allergic antibodies. Through a chain of events, these eventually cause you to release histamine into the blood stream (hence the use of antihistamines). Although meant to be protective to the body, it is this release that causes your discomfort, such as frequent sneezing, congestion and an itchy runny nose.  CAUSES  The pollen allergens may come from grasses, trees, and weeds. This is seasonal allergic rhinitis, or "hay fever." Other allergens cause year-round allergic rhinitis (perennial allergic rhinitis) such as house dust mite allergen, pet dander and mold spores.  SYMPTOMS   Nasal stuffiness (congestion).  Runny, itchy nose with sneezing and tearing of the eyes.  There is often an itching of the mouth, eyes and ears. It cannot be cured, but it can be controlled with medications. DIAGNOSIS  If you are unable to determine the offending allergen, skin or blood testing may find it. TREATMENT   Avoid the allergen.  Medications and allergy shots (immunotherapy) can help.  Hay fever may often be treated with antihistamines in pill or nasal spray forms. Antihistamines block the effects of histamine. There are over-the-counter medicines that may help with nasal congestion and swelling  around the eyes. Check with your caregiver before taking or giving this medicine. If the treatment above does not work, there are many new medications your caregiver can prescribe. Stronger medications may be used if initial measures are ineffective. Desensitizing injections can be used if medications and avoidance fails. Desensitization is when a patient is given ongoing shots until the body becomes less sensitive to the allergen. Make sure you follow up with your caregiver if problems continue. SEEK MEDICAL CARE IF:   You develop fever (more than 100.5 F (38.1 C).  You develop a cough that does not stop easily (persistent).  You have shortness of breath.  You start wheezing.  Symptoms interfere with normal daily activities. Document Released: 02/06/2001 Document Revised: 08/06/2011 Document  Reviewed: 08/18/2008 [facility] Patient Information 2013 [place], [place].   Urinary Tract Infection Urinary tract infections (UTIs) can develop anywhere along your urinary tract. Your urinary tract is your body's drainage system for removing wastes and extra water. Your urinary tract includes two kidneys, two ureters, a bladder, and a urethra. Your kidneys are a pair of bean-shaped organs. Each kidney is about the size of your fist. They are located below your ribs, one on each side of your spine. CAUSES Infections are caused by microbes, which are microscopic organisms, including fungi, viruses, and bacteria. These organisms are so small that they can only be seen through a microscope. Bacteria are the microbes that most commonly cause UTIs. SYMPTOMS  Symptoms of UTIs may vary by age and gender of the patient and by the location of the infection. Symptoms in young women typically include a frequent and intense urge to urinate and a painful, burning feeling in the bladder or urethra during urination. Older women and men are more likely to be tired, shaky, and weak and have muscle aches and abdominal pain.  A fever may mean the infection is in your kidneys. Other symptoms of a kidney infection include pain in your back or sides below the ribs, nausea, and vomiting. DIAGNOSIS To diagnose a UTI, your caregiver will ask you about your symptoms. Your caregiver also will ask to provide a urine sample. The urine sample will be tested for bacteria and white blood cells. White blood cells are made by your body to help fight infection. TREATMENT  Typically, UTIs can be treated with medication. Because most UTIs are caused by a bacterial infection, they usually can be treated with the use of antibiotics. The choice of antibiotic and length of treatment depend on your symptoms and the type of bacteria causing your infection. HOME CARE INSTRUCTIONS  If you were prescribed antibiotics, take them exactly as your caregiver instructs you. Finish the medication even if you feel better after you have only taken some of the medication.  Drink enough water and fluids to keep your urine clear or pale yellow.  Avoid caffeine, tea, and carbonated beverages. They tend to irritate your bladder.  Empty your bladder often. Avoid holding urine for long periods of time.  Empty your bladder before and after sexual intercourse.  After a bowel movement, women should cleanse from front to back. Use each tissue only once. SEEK MEDICAL CARE IF:   You have back pain.  You develop a fever.  Your symptoms do not begin to resolve within 3 days. SEEK IMMEDIATE MEDICAL CARE IF:   You have severe back pain or lower abdominal pain.  You develop chills.  You have nausea or vomiting.  You have continued burning or discomfort with urination. MAKE SURE YOU:   Understand these instructions.  Will watch your condition.  Will get help right away if you are not doing well or get worse. Document Released: 02/21/2005 Document Revised: 11/13/2011 Document Reviewed: 06/22/2011 [facility] Patient Information 2013 [place],  [place].

## 2012-08-23 LAB — URINE CULTURE: Colony Count: >=100000

## 2012-08-27 ENCOUNTER — Ambulatory Visit: Payer: BC Managed Care – PPO

## 2012-08-27 ENCOUNTER — Ambulatory Visit (INDEPENDENT_AMBULATORY_CARE_PROVIDER_SITE_OTHER): Payer: BC Managed Care – PPO | Admitting: Family Medicine

## 2012-08-27 VITALS — BP 144/90 | HR 75 | Temp 97.9°F | Resp 16 | Ht 67.0 in | Wt 222.0 lb

## 2012-08-27 DIAGNOSIS — E039 Hypothyroidism, unspecified: Secondary | ICD-10-CM

## 2012-08-27 DIAGNOSIS — G8929 Other chronic pain: Secondary | ICD-10-CM

## 2012-08-27 DIAGNOSIS — K5900 Constipation, unspecified: Secondary | ICD-10-CM

## 2012-08-27 DIAGNOSIS — M25572 Pain in left ankle and joints of left foot: Secondary | ICD-10-CM

## 2012-08-27 DIAGNOSIS — M25571 Pain in right ankle and joints of right foot: Secondary | ICD-10-CM

## 2012-08-27 DIAGNOSIS — L408 Other psoriasis: Secondary | ICD-10-CM

## 2012-08-27 DIAGNOSIS — M25579 Pain in unspecified ankle and joints of unspecified foot: Secondary | ICD-10-CM

## 2012-08-27 DIAGNOSIS — R300 Dysuria: Secondary | ICD-10-CM

## 2012-08-27 DIAGNOSIS — N390 Urinary tract infection, site not specified: Secondary | ICD-10-CM

## 2012-08-27 DIAGNOSIS — M25561 Pain in right knee: Secondary | ICD-10-CM

## 2012-08-27 DIAGNOSIS — J309 Allergic rhinitis, unspecified: Secondary | ICD-10-CM

## 2012-08-27 DIAGNOSIS — M25569 Pain in unspecified knee: Secondary | ICD-10-CM

## 2012-08-27 DIAGNOSIS — L409 Psoriasis, unspecified: Secondary | ICD-10-CM

## 2012-08-27 LAB — POCT URINALYSIS DIPSTICK
Bilirubin, UA: NEGATIVE
Blood, UA: NEGATIVE
Glucose, UA: NEGATIVE
Ketones, UA: NEGATIVE
Nitrite, UA: POSITIVE
Spec Grav, UA: 1.02
Urobilinogen, UA: 0.2
pH, UA: 6.5

## 2012-08-27 LAB — POCT CBC
Granulocyte percent: 68.1 %G (ref 37–80)
HCT, POC: 39.6 % (ref 37.7–47.9)
Hemoglobin: 12.5 g/dL (ref 12.2–16.2)
Lymph, poc: 1.6 (ref 0.6–3.4)
MCH, POC: 27.7 pg (ref 27–31.2)
MCHC: 31.6 g/dL — AB (ref 31.8–35.4)
MCV: 87.7 fL (ref 80–97)
MID (cbc): 0.5 (ref 0–0.9)
MPV: 10 fL (ref 0–99.8)
POC Granulocyte: 4.6 (ref 2–6.9)
POC LYMPH PERCENT: 24.6 %L (ref 10–50)
POC MID %: 7.3 %M (ref 0–12)
Platelet Count, POC: 248 10*3/uL (ref 142–424)
RBC: 4.52 M/uL (ref 4.04–5.48)
RDW, POC: 15.1 %
WBC: 6.7 10*3/uL (ref 4.6–10.2)

## 2012-08-27 LAB — POCT UA - MICROSCOPIC ONLY
Casts, Ur, LPF, POC: NEGATIVE
Crystals, Ur, HPF, POC: NEGATIVE
Mucus, UA: NEGATIVE
Yeast, UA: NEGATIVE

## 2012-08-27 LAB — TSH: TSH: 0.864 u[IU]/mL (ref 0.350–4.500)

## 2012-08-27 MED ORDER — CLOBETASOL PROPIONATE 0.05 % EX SHAM: 1.0000 "application " | MEDICATED_SHAMPOO | CUTANEOUS | Status: DC

## 2012-08-27 MED ORDER — CEFTRIAXONE SODIUM 1 G IJ SOLR
1.0000 g | INTRAMUSCULAR | Status: DC
  Administered 2012-08-27: 1 g via INTRAMUSCULAR

## 2012-08-27 MED ORDER — CETIRIZINE HCL 10 MG PO TABS: 10.0000 mg | ORAL_TABLET | Freq: Every day | ORAL | Status: DC

## 2012-08-27 MED ORDER — LEVOFLOXACIN 500 MG PO TABS: 500.0000 mg | ORAL_TABLET | Freq: Every day | ORAL | Status: DC

## 2012-08-27 MED ORDER — FLUOCINOLONE ACETONIDE 0.01 % OT OIL: 5.0000 [drp] | TOPICAL_OIL | Freq: Two times a day (BID) | OTIC | Status: DC

## 2012-08-27 MED ORDER — POLYETHYLENE GLYCOL 3350 17 GM/SCOOP PO POWD: 17.0000 g | Freq: Two times a day (BID) | ORAL | Status: DC | PRN

## 2012-08-27 NOTE — Progress Notes (Signed)
Subjective:    Patient ID: Connie [name], female    DOB: 01/28/1961, 52 y.o.   MRN: [id number] Chief Complaint  Patient presents with  . Dysuria    pt was treated but feels worse    HPI  When pt was placed on the macrobid for her E. Coli UTI, all of her symptoms quickly resolved. She finished the course this weekend and felt fine but then suddently her UTI sxs returned yesterday and they were much worse with significant hematuria. Took azo but dysuria is getting worse and her head feels foggy and now there is blood in the urine and sxs continued through the night so she didn't get any sleep due to them. She is not doing baths, hot tubs, douching.  She does struggle with chronic constipations - sometimes very painful with blood.  Psoriasis - her dermatologist is considering putting her on a DMARD - an injection.  She used to be a clobetasol solution which worked much better for her than the spray.  Ears are always very itchy.  Right knee locks up and gives way, along of pain along outside of knee that sometimes radiates into the back of her knee/popliteal fossa.  Does not want surgery but getting worse and starting to inhibit her exercising.  H/o ankle injuries now with permanent swelling on both feet - looks like she has 2 ankles - would like to see a foot doctor.   She is continuing to work very hard on her diet and exercise to loose weight.  Past Medical History  Diagnosis Date  . Allergic rhinitis   . Hyperlipidemia   . Hypertension   . Lower extremity edema   . Hypothyroid   . Arthritis   . Diabetes mellitus without complication   . Anxiety    Current Outpatient Prescriptions on File Prior to Visit  Medication Sig Dispense Refill  . aspirin EC 81 MG tablet Take 81 mg by mouth daily.       Marland Kitchen glucose blood test strip Use as instructed to check cbgs. [facility] Truetest Strips  100 each  12  . hydrochlorothiazide (HYDRODIURIL) 25 MG tablet Take 1 tablet (25 mg total) by mouth  daily.  90 tablet  3  . ipratropium (ATROVENT) 0.06 % nasal spray Place 2 sprays into the nose 4 (four) times daily.  15 mL  1  . levothyroxine (SYNTHROID, LEVOTHROID) 100 MCG tablet Take 1 tablet (100 mcg total) by mouth daily.  90 tablet  1  . metFORMIN (GLUCOPHAGE) 500 MG tablet Take 1 tablet (500 mg total) by mouth daily with breakfast.  90 tablet  0  . nystatin (MYCOSTATIN) powder Apply topically 4 (four) times daily.  60 g  5  . phenazopyridine (PYRIDIUM) 100 MG tablet Take 1 tablet (100 mg total) by mouth 3 (three) times daily as needed for pain.  10 tablet  0  . Probiotic Product (PROBIOTIC ACIDOPHILUS) CAPS Take by mouth.       No current facility-administered medications on file prior to visit.   Allergies  Allergen Reactions  . Codeine Other (See Comments)    incoherent  . Niacin Other (See Comments)    Turns red from chest up. Body hot.  . Pravastatin     Joint pain      Review of Systems  Constitutional: Positive for activity change. Negative for fever, chills, diaphoresis, appetite change, fatigue and unexpected weight change.  HENT: Positive for congestion, rhinorrhea, postnasal drip and sinus pressure.  Gastrointestinal: Positive for constipation. Negative for abdominal pain, diarrhea, blood in stool, anal bleeding and rectal pain.  Genitourinary: Positive for dysuria, urgency, frequency and hematuria. Negative for flank pain, decreased urine volume, vaginal bleeding, vaginal discharge, enuresis, difficulty urinating, genital sores, vaginal pain, menstrual problem, pelvic pain and dyspareunia.  Musculoskeletal: Positive for joint swelling and arthralgias. Negative for gait problem.  Skin: Positive for rash.  Neurological: Positive for light-headedness.  Hematological: Negative for adenopathy.  Psychiatric/Behavioral: Positive for sleep disturbance.      BP 144/90  Pulse 75  Temp(Src) 97.9 F (36.6 C) (Oral)  Resp 16  Ht 5\' 7"  (1.702 m)  Wt 222 lb (100.699 kg)   BMI 34.76 kg/m2  SpO2 98% Objective:   Physical Exam  Constitutional: She is oriented to person, place, and time. She appears well-developed and well-nourished. She appears ill. No distress.  HENT:  Head: Normocephalic and atraumatic.  Right Ear: External ear normal. Tympanic membrane is not injected and not retracted. A middle ear effusion is present.  Left Ear: External ear normal. Tympanic membrane is not injected and not retracted. A middle ear effusion is present.  Nose: Mucosal edema and rhinorrhea present. Right sinus exhibits maxillary sinus tenderness. Left sinus exhibits maxillary sinus tenderness.  Mouth/Throat: Uvula is midline and mucous membranes are normal. Posterior oropharyngeal erythema present. No oropharyngeal exudate, posterior oropharyngeal edema or tonsillar abscesses.  Bilateral ear canals with yellowish waxy flakes  Eyes: Conjunctivae are normal. Right eye exhibits no discharge. Left eye exhibits no discharge. No scleral icterus.  Neck: Normal range of motion. Neck supple.  Cardiovascular: Normal rate, regular rhythm, normal heart sounds and intact distal pulses.   Pulmonary/Chest: Effort normal and breath sounds normal.  Abdominal: Soft. Bowel sounds are normal. She exhibits no distension. There is no hepatosplenomegaly. There is no tenderness. There is no rebound, no guarding and no CVA tenderness.  Musculoskeletal:       Right knee: She exhibits effusion. She exhibits normal range of motion, no ecchymosis, no erythema, normal alignment and no LCL laxity. Tenderness found. Lateral joint line tenderness noted.       Right ankle: She exhibits swelling. She exhibits normal range of motion, no ecchymosis and normal pulse. No tenderness.       Left ankle: She exhibits swelling. She exhibits normal range of motion, no ecchymosis and normal pulse. No tenderness.  Tenderness with varus stress.  Lymphadenopathy:       Head (right side): Submandibular adenopathy present. No  preauricular and no posterior auricular adenopathy present.       Head (left side): Submandibular adenopathy present. No preauricular and no posterior auricular adenopathy present.    She has no cervical adenopathy.       Right: No supraclavicular adenopathy present.       Left: No supraclavicular adenopathy present.  Neurological: She is alert and oriented to person, place, and time.  Skin: Skin is warm and dry. She is not diaphoretic. No erythema.  Psychiatric: She has a normal mood and affect. Her behavior is normal.       Results for orders placed in visit on 08/27/12  POCT URINALYSIS DIPSTICK      Result Value Range   Color, UA yellow     Clarity, UA hazy     Glucose, UA neg     Bilirubin, UA neg     Ketones, UA neg     Spec Grav, UA 1.020     Blood, UA neg  pH, UA 6.5     Protein, UA trace     Urobilinogen, UA 0.2     Nitrite, UA pos     Leukocytes, UA small (1+)    POCT UA - MICROSCOPIC ONLY      Result Value Range   WBC, Ur, HPF, POC 10-12     RBC, urine, microscopic 1-2     Bacteria, U Microscopic small     Mucus, UA neg     Epithelial cells, urine per micros 1-4     Crystals, Ur, HPF, POC neg     Casts, Ur, LPF, POC neg     Yeast, UA neg    POCT CBC      Result Value Range   WBC 6.7  4.6 - 10.2 K/uL   Lymph, poc 1.6  0.6 - 3.4   POC LYMPH PERCENT 24.6  10 - 50 %L   MID (cbc) 0.5  0 - 0.9   POC MID % 7.3  0 - 12 %M   POC Granulocyte 4.6  2 - 6.9   Granulocyte percent 68.1  37 - 80 %G   RBC 4.52  4.04 - 5.48 M/uL   Hemoglobin 12.5  12.2 - 16.2 g/dL   HCT, POC 82.9  56.2 - 47.9 %   MCV 87.7  80 - 97 fL   MCH, POC 27.7  27 - 31.2 pg   MCHC 31.6 (*) 31.8 - 35.4 g/dL   RDW, POC 13.0     Platelet Count, POC 248  142 - 424 K/uL   MPV 10.0  0 - 99.8 fL  [facility] reading (PRIMARY) by  Dr. Clelia Croft. Right knee: No acute bony abnormality Right ankle: No acute bony abnormality Left ankle: No acute bony abnormality    Assessment & Plan:  Dysuria - Plan: POCT  urinalysis dipstick, POCT UA - Microscopic Only, POCT CBC, cefTRIAXone (ROCEPHIN) injection 1 g  UTI (urinary tract infection) - Plan: POCT CBC, cefTRIAXone (ROCEPHIN) injection 1 g, Urine culture. Start levaquin (last e. Coli was sensitive to this).  Hypothyroid - Plan: TSH  Allergic rhinitis - start zyrtec and cont atrovent, pt does not currently have a sinus infection but will be on levaquin for her UTI anyway so it will cover - lots of steam and nettipot to prevent development.  Psoriasis - f/u w/ derm, switch clobetasol formulation from spray to shampoo for scalp.  Try fluocinolone ear drops for psoriasis in ear canals  Chronic knee pain, right - Plan: DG Knee Complete 4 Views Right, Ambulatory referral to Orthopedic Surgery - suspect meniscal injury, pt may need MRI but will defer this to her discussion w/ ortho  Pain in joint, ankle and foot, left and right - Plan: DG Ankle Complete Left, Ambulatory referral to Orthopedic Surgery - chronic soft tissue swelling due to prev injury.  Consider PT but will again defer to ortho per pt preference.  Chronic constipation - start miralax  Meds ordered this encounter  Medications  . cefTRIAXone (ROCEPHIN) injection 1 g    Sig:   . levofloxacin (LEVAQUIN) 500 MG tablet    Sig: Take 1 tablet (500 mg total) by mouth daily.    Dispense:  7 tablet    Refill:  0  . cetirizine (ZYRTEC) 10 MG tablet    Sig: Take 1 tablet (10 mg total) by mouth daily.    Dispense:  30 tablet    Refill:  11  . Fluocinolone Acetonide 0.01 % OIL  Sig: Place 5 drops in ear(s) 2 (two) times daily. For 7d as needed    Dispense:  1 Bottle    Refill:  5  . Clobetasol Propionate 0.05 % shampoo    Sig: Apply 1 application topically every other day.    Dispense:  118 mL    Refill:  5

## 2012-08-28 ENCOUNTER — Telehealth: Payer: Self-pay

## 2012-08-28 DIAGNOSIS — E119 Type 2 diabetes mellitus without complications: Secondary | ICD-10-CM

## 2012-08-28 LAB — URINE CULTURE
Colony Count: NO GROWTH
Organism ID, Bacteria: NO GROWTH

## 2012-08-28 NOTE — Telephone Encounter (Signed)
Patient called stating that she needs a refill on her Metformin medication. She would like the medication sent to [facility] if you have further questions please contact her @ [phone]. Thanks

## 2012-08-29 MED ORDER — METFORMIN HCL 500 MG PO TABS: 500.0000 mg | ORAL_TABLET | Freq: Every day | ORAL | Status: DC

## 2012-08-29 NOTE — Telephone Encounter (Signed)
This is sent in for her.

## 2012-09-01 ENCOUNTER — Telehealth: Payer: Self-pay | Admitting: Radiology

## 2012-09-01 NOTE — Telephone Encounter (Signed)
Patient advised of labs.

## 2012-09-05 ENCOUNTER — Ambulatory Visit: Payer: BC Managed Care – PPO | Admitting: Family Medicine

## 2012-09-12 ENCOUNTER — Ambulatory Visit: Payer: BC Managed Care – PPO | Admitting: Family Medicine

## 2012-09-19 ENCOUNTER — Ambulatory Visit: Payer: BC Managed Care – PPO | Admitting: Family Medicine

## 2012-12-03 ENCOUNTER — Other Ambulatory Visit: Payer: Self-pay

## 2012-12-03 DIAGNOSIS — E119 Type 2 diabetes mellitus without complications: Secondary | ICD-10-CM

## 2012-12-03 MED ORDER — METFORMIN HCL 500 MG PO TABS: 500.0000 mg | ORAL_TABLET | Freq: Every day | ORAL | Status: DC

## 2013-01-01 ENCOUNTER — Other Ambulatory Visit: Payer: Self-pay

## 2013-01-01 DIAGNOSIS — E119 Type 2 diabetes mellitus without complications: Secondary | ICD-10-CM

## 2013-01-01 MED ORDER — METFORMIN HCL 500 MG PO TABS: 500.0000 mg | ORAL_TABLET | Freq: Every day | ORAL | Status: DC

## 2013-01-13 ENCOUNTER — Other Ambulatory Visit: Payer: Self-pay

## 2013-01-13 DIAGNOSIS — E039 Hypothyroidism, unspecified: Secondary | ICD-10-CM

## 2013-01-13 MED ORDER — LEVOTHYROXINE SODIUM 100 MCG PO TABS: 100.0000 ug | ORAL_TABLET | Freq: Every day | ORAL | Status: DC

## 2013-02-02 ENCOUNTER — Other Ambulatory Visit: Payer: Self-pay

## 2013-02-02 DIAGNOSIS — E119 Type 2 diabetes mellitus without complications: Secondary | ICD-10-CM

## 2013-02-02 MED ORDER — METFORMIN HCL 500 MG PO TABS: 500.0000 mg | ORAL_TABLET | Freq: Every day | ORAL | Status: DC

## 2013-02-10 ENCOUNTER — Ambulatory Visit (INDEPENDENT_AMBULATORY_CARE_PROVIDER_SITE_OTHER): Payer: BC Managed Care – PPO | Admitting: Family Medicine

## 2013-02-10 VITALS — BP 122/76 | HR 79 | Temp 98.8°F | Resp 17 | Ht 67.0 in | Wt 230.8 lb

## 2013-02-10 DIAGNOSIS — J309 Allergic rhinitis, unspecified: Secondary | ICD-10-CM

## 2013-02-10 DIAGNOSIS — J3489 Other specified disorders of nose and nasal sinuses: Secondary | ICD-10-CM

## 2013-02-10 DIAGNOSIS — R1031 Right lower quadrant pain: Secondary | ICD-10-CM

## 2013-02-10 DIAGNOSIS — E119 Type 2 diabetes mellitus without complications: Secondary | ICD-10-CM

## 2013-02-10 DIAGNOSIS — I10 Essential (primary) hypertension: Secondary | ICD-10-CM

## 2013-02-10 DIAGNOSIS — K649 Unspecified hemorrhoids: Secondary | ICD-10-CM

## 2013-02-10 DIAGNOSIS — E039 Hypothyroidism, unspecified: Secondary | ICD-10-CM

## 2013-02-10 DIAGNOSIS — K6289 Other specified diseases of anus and rectum: Secondary | ICD-10-CM

## 2013-02-10 MED ORDER — CLOBETASOL PROPIONATE 0.05 % EX LOTN: 1.0000 "application " | TOPICAL_LOTION | Freq: Two times a day (BID) | CUTANEOUS | Status: DC

## 2013-02-10 MED ORDER — IPRATROPIUM BROMIDE 0.06 % NA SOLN: 2.0000 | Freq: Four times a day (QID) | NASAL | Status: DC

## 2013-02-10 MED ORDER — AMOXICILLIN 875 MG PO TABS: 875.0000 mg | ORAL_TABLET | Freq: Two times a day (BID) | ORAL | Status: DC

## 2013-02-10 MED ORDER — FLUCONAZOLE 150 MG PO TABS: 150.0000 mg | ORAL_TABLET | Freq: Once | ORAL | Status: DC

## 2013-02-10 MED ORDER — HYDROCHLOROTHIAZIDE 25 MG PO TABS: 25.0000 mg | ORAL_TABLET | Freq: Every day | ORAL | Status: DC

## 2013-02-10 MED ORDER — METFORMIN HCL 500 MG PO TABS: 500.0000 mg | ORAL_TABLET | Freq: Every day | ORAL | Status: DC

## 2013-02-10 MED ORDER — LEVOTHYROXINE SODIUM 100 MCG PO TABS: 100.0000 ug | ORAL_TABLET | Freq: Every day | ORAL | Status: DC

## 2013-02-10 NOTE — Progress Notes (Signed)
Patient ID: Connie [name] MRN: [id number], DOB: 10/08/1960, 52 y.o. Date of Encounter: 02/10/2013, 8:02 PM  Primary Physician: Norberto Sorenson, MD  Chief Complaint:  Chief Complaint  Patient presents with  . Medication Refill  . Labs Only  . ears clogged  . Hemorrhoids    HPI: 52 y.o. year old female presents with 21 day history of nasal congestion, post nasal drip, sore throat, sinus pressure, and cough. Afebrile. No chills. Nasal congestion thick and green/yellow. Sinus pressure is the worst symptom. Cough is productive secondary to post nasal drip and not associated with time of day. Ears feel full, leading to sensation of muffled hearing. Has tried OTC cold preps without success. 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   Works as Airline pilot for [employer].  Sits all day, works 7 days a week.  She has bleeding lumps at rectum She has had RLq crampy pain chronically since her hysterectomy when bowel was "nicked".  No recent antibiotics, recent travels, or sick contacts   No leg trauma, sedentary periods, h/o cancer, or tobacco use.  Past Medical History  Diagnosis Date  . Allergic rhinitis   . Hyperlipidemia   . Hypertension   . Lower extremity edema   . Hypothyroid   . Arthritis   . Diabetes mellitus without complication   . Anxiety      Home Meds: Prior to Admission medications   Medication Sig Start Date End Date Taking? Authorizing Provider  aspirin EC 81 MG tablet Take 81 mg by mouth daily.    Yes Historical Provider, MD  Clobetasol Propionate 0.05 % shampoo Apply 1 application topically every other day. 08/27/12  Yes Sherren Mocha, MD  Fluocinolone Acetonide 0.01 % OIL Place 5 drops in ear(s) 2 (two) times daily. For 7d as needed 08/27/12  Yes Sherren Mocha, MD  glucose blood test strip Use as instructed to check cbgs. [facility] Truetest Strips 07/04/12  Yes Sherren Mocha, MD  hydrochlorothiazide (HYDRODIURIL) 25 MG tablet Take 1 tablet (25 mg total) by mouth daily. 06/07/12 06/07/13 Yes Sherren Mocha, MD  ipratropium (ATROVENT) 0.06 % nasal spray Place 2 sprays into the nose 4 (four) times daily. 08/21/12  Yes Shade Flood, MD  levofloxacin (LEVAQUIN) 500 MG tablet Take 1 tablet (500 mg total) by mouth daily. 08/27/12  Yes Sherren Mocha, MD  levothyroxine (SYNTHROID, LEVOTHROID) 100 MCG tablet Take 1 tablet (100 mcg total) by mouth daily. 01/13/13  Yes Sherren Mocha, MD  metFORMIN (GLUCOPHAGE) 500 MG tablet Take 1 tablet (500 mg total) by mouth daily with breakfast. PATIENT NEEDS OFFICE VISIT FOR ADDITIONAL REFILLS - FINAL NOTICE 02/02/13  Yes Sherren Mocha, MD  nystatin (MYCOSTATIN) powder Apply topically 4 (four) times daily. 06/04/12  Yes Sherren Mocha, MD  polyethylene glycol powder (GLYCOLAX/MIRALAX) powder Take 17 g by mouth 2 (two) times daily as needed. 08/27/12  Yes Sherren Mocha, MD  Probiotic Product (PROBIOTIC ACIDOPHILUS) CAPS Take by mouth.   Yes Historical Provider, MD    Allergies:  Allergies  Allergen Reactions  . Codeine Other (See Comments)    incoherent  . Niacin Other (See Comments)    Turns red from chest up. Body hot.  . Pravastatin     Joint pain     History   Social History  . Marital Status: Single    Spouse Name: N/A    Number of Children: N/A  . Years of Education: N/A   Occupational History  .  Lost job recently    Social History Main Topics  . Smoking status: Never Smoker   . Smokeless tobacco: Not on file  . Alcohol Use: Yes  . Drug Use: No  . Sexual Activity: No   Other Topics Concern  . Not on file   Social History Narrative   Single mom   Divorced   Does not exercise regularly           Review of Systems: Constitutional: negative for chills, fever, night sweats or weight changes Cardiovascular: negative for chest pain or palpitations Respiratory: negative for hemoptysis, wheezing, or shortness of breath Abdominal: negative for abdominal pain, nausea, vomiting or diarrhea Dermatological: negative for rash Neurologic: negative for  headache   Physical Exam: Blood pressure 122/76, pulse 79, temperature 98.8 F (37.1 C), temperature source Oral, resp. rate 17, height 5\' 7"  (1.702 m), weight 230 lb 12.8 oz (104.69 kg), SpO2 96.00%., Body mass index is 36.14 kg/(m^2). General: Well developed, well nourished, in no acute distress. Head: Normocephalic, atraumatic, eyes without discharge, sclera non-icteric, nares are congested. Bilateral auditory canals clear, TM's are without perforation, pearly grey with reflective cone of light bilaterally. Serous effusion bilaterally behind TM's. Maxillary sinus TTP. Oral cavity moist, dentition normal. Posterior pharynx with post nasal drip and mild erythema. No peritonsillar abscess or tonsillar exudate. Neck: Supple. No thyromegaly. Full ROM. No lymphadenopathy. Lungs: Clear bilaterally to auscultation without wheezes, rales, or rhonchi. Breathing is unlabored.  Heart: RRR with S1 S2. No murmurs, rubs, or gallops appreciated. Msk:  Strength and tone normal for age. Extremities: No clubbing or cyanosis. No edema. Neuro: Alert and oriented X 3. Moves all extremities spontaneously. CNII-XII grossly in tact. Psych:  Responds to questions appropriately with a normal affect.   Labs:   ASSESSMENT AND PLAN:  52 y.o. year old female with sinusitis Sinus pressure - Plan: amoxicillin (AMOXIL) 875 MG tablet  RLQ abdominal pain - Plan: Ambulatory referral to Gastroenterology  Hemorrhoid - Plan: fluconazole (DIFLUCAN) 150 MG tablet  Proctitis - Plan: fluconazole (DIFLUCAN) 150 MG tablet   -Tylenol/Motrin prn -Rest/fluids -RTC precautions -RTC 3-5 days if no improvement  Signed, Elvina Sidle, MD 02/10/2013 8:02 PM

## 2013-02-10 NOTE — Patient Instructions (Addendum)
Hemorrhoids Hemorrhoids are swollen veins around the rectum or anus. There are two types of hemorrhoids:   Internal hemorrhoids. These occur in the veins just inside the rectum. They may poke through to the outside and become irritated and painful.  External hemorrhoids. These occur in the veins outside the anus and can be felt as a painful swelling or hard lump near the anus. CAUSES  Pregnancy.   Obesity.   Constipation or diarrhea.   Straining to have a bowel movement.   Sitting for long periods on the toilet.  Heavy lifting or other activity that caused you to strain.  Anal intercourse. SYMPTOMS   Pain.   Anal itching or irritation.   Rectal bleeding.   Fecal leakage.   Anal swelling.   One or more lumps around the anus.  DIAGNOSIS  Your caregiver may be able to diagnose hemorrhoids by visual examination. Other examinations or tests that may be performed include:   Examination of the rectal area with a gloved hand (digital rectal exam).   Examination of anal canal using a small tube (scope).   A blood test if you have lost a significant amount of blood.  A test to look inside the colon (sigmoidoscopy or colonoscopy). TREATMENT Most hemorrhoids can be treated at home. However, if symptoms do not seem to be getting better or if you have a lot of rectal bleeding, your caregiver may perform a procedure to help make the hemorrhoids get smaller or remove them completely. Possible treatments include:   Placing a rubber band at the base of the hemorrhoid to cut off the circulation (rubber band ligation).   Injecting a chemical to shrink the hemorrhoid (sclerotherapy).   Using a tool to burn the hemorrhoid (infrared light therapy).   Surgically removing the hemorrhoid (hemorrhoidectomy).   Stapling the hemorrhoid to block blood flow to the tissue (hemorrhoid stapling).  HOME CARE INSTRUCTIONS   Eat foods with fiber, such as whole grains, beans,  nuts, fruits, and vegetables. Ask your doctor about taking products with added fiber in them (fibersupplements).  Increase fluid intake. Drink enough water and fluids to keep your urine clear or pale yellow.   Exercise regularly.   Go to the bathroom when you have the urge to have a bowel movement. Do not wait.   Avoid straining to have bowel movements.   Keep the anal area dry and clean. Use wet toilet paper or moist towelettes after a bowel movement.   Medicated creams and suppositories may be used or applied as directed.   Only take over-the-counter or prescription medicines as directed by your caregiver.   Take warm sitz baths for 15 20 minutes, 3 4 times a day to ease pain and discomfort.   Place ice packs on the hemorrhoids if they are tender and swollen. Using ice packs between sitz baths may be helpful.   Put ice in a plastic bag.   Place a towel between your skin and the bag.   Leave the ice on for 15 20 minutes, 3 4 times a day.   Do not use a donut-shaped pillow or sit on the toilet for long periods. This increases blood pooling and pain.  SEEK MEDICAL CARE IF:  You have increasing pain and swelling that is not controlled by treatment or medicine.  You have uncontrolled bleeding.  You have difficulty or you are unable to have a bowel movement.  You have pain or inflammation outside the area of the hemorrhoids. MAKE SURE YOU:    Understand these instructions.  Will watch your condition.  Will get help right away if you are not doing well or get worse. Document Released: 05/11/2000 Document Revised: 04/30/2012 Document Reviewed: 03/18/2012 [employer] Patient Information 2014 [employer].  

## 2013-03-09 ENCOUNTER — Telehealth: Payer: Self-pay

## 2013-03-09 DIAGNOSIS — E119 Type 2 diabetes mellitus without complications: Secondary | ICD-10-CM

## 2013-03-09 MED ORDER — METFORMIN HCL 500 MG PO TABS: 500.0000 mg | ORAL_TABLET | Freq: Every day | ORAL | Status: DC

## 2013-03-09 NOTE — Telephone Encounter (Signed)
Pt would like to have more refills on her metformin she has been out since yesterday, she recently had an OV and does not understand why she did not receive refills. [phone] [facility] [place]

## 2013-03-09 NOTE — Telephone Encounter (Signed)
Patient notified and voiced understanding.

## 2013-03-09 NOTE — Telephone Encounter (Signed)
Rx sent. Meds ordered this encounter  Medications  . metFORMIN (GLUCOPHAGE) 500 MG tablet    Sig: Take 1 tablet (500 mg total) by mouth daily with breakfast.    Dispense:  30 tablet    Refill:  1    Order Specific Question:  Supervising Provider    Answer:  Merla Riches, ROBERT P [[id number]]    It looks like she didn't get refills when she was here last month because Dr. Milus Glazier addressed only her acute issues, and not her chronic ones, including diabetes.  He did not update her labwork. I've sent in another 2 months' worth.  Please plan to follow-up with Dr. Clelia Croft for fasting labs before the second month runs out.

## 2013-04-15 ENCOUNTER — Ambulatory Visit (INDEPENDENT_AMBULATORY_CARE_PROVIDER_SITE_OTHER): Payer: BC Managed Care – PPO | Admitting: Family Medicine

## 2013-04-15 VITALS — BP 164/96 | HR 78 | Temp 99.2°F | Resp 20 | Ht 66.75 in | Wt 232.0 lb

## 2013-04-15 DIAGNOSIS — E119 Type 2 diabetes mellitus without complications: Secondary | ICD-10-CM

## 2013-04-15 DIAGNOSIS — Z1239 Encounter for other screening for malignant neoplasm of breast: Secondary | ICD-10-CM

## 2013-04-15 DIAGNOSIS — I10 Essential (primary) hypertension: Secondary | ICD-10-CM

## 2013-04-15 DIAGNOSIS — E039 Hypothyroidism, unspecified: Secondary | ICD-10-CM

## 2013-04-15 DIAGNOSIS — Z1211 Encounter for screening for malignant neoplasm of colon: Secondary | ICD-10-CM

## 2013-04-15 DIAGNOSIS — R350 Frequency of micturition: Secondary | ICD-10-CM

## 2013-04-15 DIAGNOSIS — Z23 Encounter for immunization: Secondary | ICD-10-CM

## 2013-04-15 LAB — POCT URINALYSIS DIPSTICK
Bilirubin, UA: NEGATIVE
Blood, UA: NEGATIVE
Glucose, UA: NEGATIVE
Ketones, UA: NEGATIVE
Leukocytes, UA: NEGATIVE
Nitrite, UA: NEGATIVE
Protein, UA: NEGATIVE
Spec Grav, UA: 1.02
Urobilinogen, UA: 0.2
pH, UA: 6.5

## 2013-04-15 LAB — POCT UA - MICROSCOPIC ONLY
Casts, Ur, LPF, POC: NEGATIVE
Crystals, Ur, HPF, POC: NEGATIVE
Mucus, UA: NEGATIVE
Yeast, UA: NEGATIVE

## 2013-04-15 LAB — GLUCOSE, POCT (MANUAL RESULT ENTRY): POC Glucose: 69 mg/dl — AB (ref 70–99)

## 2013-04-15 LAB — POCT GLYCOSYLATED HEMOGLOBIN (HGB A1C): Hemoglobin A1C: 6.3

## 2013-04-15 MED ORDER — LANCETS MISC: Status: AC

## 2013-04-15 MED ORDER — BLOOD GLUCOSE MONITORING SUPPL KIT: PACK | Status: DC

## 2013-04-15 MED ORDER — LISINOPRIL-HYDROCHLOROTHIAZIDE 20-25 MG PO TABS: 1.0000 | ORAL_TABLET | Freq: Every day | ORAL | Status: DC

## 2013-04-15 MED ORDER — METFORMIN HCL 500 MG PO TABS: 500.0000 mg | ORAL_TABLET | Freq: Every day | ORAL | Status: DC

## 2013-04-15 MED ORDER — BLOOD GLUCOSE TEST VI STRP: ORAL_STRIP | Status: DC

## 2013-04-15 NOTE — Patient Instructions (Signed)
Check your blood sugar outside of the office fasting twice a week and return to clinic if >120 consistently. Check your blood pressure outside of the office once a week and return to clinic if >140/90.  Diabetes and Standards of Medical Care  Diabetes is complicated. You may find that your diabetes team includes a dietitian, nurse, diabetes educator, eye doctor, and more. To help everyone know what is going on and to help you get the care you deserve, the following schedule of care was developed to help keep you on track. Below are the tests, exams, vaccines, medicines, education, and plans you will need. HbA1c test This test shows how well you have controlled your glucose over the past 2 3 months. It is used to see if your diabetes management plan needs to be adjusted.   It is performed at least 2 times a year if you are meeting treatment goals.  It is performed 4 times a year if therapy has changed or if you are not meeting treatment goals. Blood pressure test  This test is performed at every routine medical visit. The goal is less than 140/90 mmHg for most people, but 130/80 mmHg in some cases. Ask your health care provider about your goal. Dental exam  Follow up with the dentist regularly. Eye exam  If you are diagnosed with type 1 diabetes as a child, get an exam upon reaching the age of 10 years or older and have had diabetes for 3 5 years. Yearly eye exams are recommended after that initial eye exam.  If you are diagnosed with type 1 diabetes as an adult, get an exam within 5 years of diagnosis and then yearly.  If you are diagnosed with type 2 diabetes, get an exam as soon as possible after the diagnosis and then yearly. Foot care exam  Visual foot exams are performed at every routine medical visit. The exams check for cuts, injuries, or other problems with the feet.  A comprehensive foot exam should be done yearly. This includes visual inspection as well as assessing foot pulses  and testing for loss of sensation.  Check your feet nightly for cuts, injuries, or other problems with your feet. Tell your health care provider if anything is not healing. Kidney function test (urine microalbumin)  This test is performed once a year.  Type 1 diabetes: The first test is performed 5 years after diagnosis.  Type 2 diabetes: The first test is performed at the time of diagnosis.  A serum creatinine and estimated glomerular filtration rate (eGFR) test is done once a year to assess the level of chronic kidney disease (CKD), if present. Lipid profile (cholesterol, HDL, LDL, triglycerides)  Performed every 5 years for most people.  The goal for LDL is less than 100 mg/dL. If you are at high risk, the goal is less than 70 mg/dL.  The goal for HDL is 40 mg/dL 50 mg/dL for men and 50 mg/dL 60 mg/dL for women. An HDL cholesterol of 60 mg/dL or higher gives some protection against heart disease.  The goal for triglycerides is less than 150 mg/dL. Influenza vaccine, pneumococcal vaccine, and hepatitis B vaccine  The influenza vaccine is recommended yearly.  The pneumococcal vaccine is generally given once in a lifetime. However, there are some instances when another vaccination is recommended. Check with your health care provider.  The hepatitis B vaccine is also recommended for adults with diabetes. Diabetes self-management education  Education is recommended at diagnosis and ongoing as  needed. Treatment plan  Your treatment plan is reviewed at every medical visit. Document Released: 03/11/2009 Document Revised: 01/14/2013 Document Reviewed: 10/14/2012 [facility] Patient Information 2014 [place], [place].

## 2013-04-15 NOTE — Progress Notes (Addendum)
Subjective:    Patient ID: Connie [name], female    DOB: 1960/10/16, 52 y.o.   MRN: [id number]  This chart was scribed for Carley Hammed Madline Oesterling-MD, by Ladona Ridgel Day, Scribe. This patient was seen in room 13 and the patient's care was started at 5:47 PM.  HPI HPI Comments: Connie [name] is a 52 y.o. female who has diabetes and HTN and needs lab work and medication refills. She also reports recent urinary frequency for the past 1-2d.   Today, she presents to the [facility] and states that she has been working a lot recently without any rest. She states her sugar yesterday was 211 which she attributes to not taking her metformin for the past 2 days - she ran out. She states has not been checking her blood sugar - couldn't afford the strips for the meter she has - but is planning to get a different meter w/ more affordable strips for testing her blood sugar. She states has not been feeling well today. She has been having a hard time staying away from chocolate. Eating breakfast and getting in 3 meals/d. Trying not to eat in the evenings. Hoping the gym in her apt complex is going to get a trainer so she might start working out then.  She states her synthroid medicine has been working well for her as long as she takes it 1-2 hrs before eating.   She also c/o urinary frequency and in larger amounts of urine per voiding. She denies dysuria and urinary incontinence. No nocturia. She thinks it might be related to higher than normal blood sugar levels recently.    Past Medical History  Diagnosis Date  . Allergic rhinitis   . Hyperlipidemia   . Hypertension   . Lower extremity edema   . Hypothyroid   . Arthritis   . Diabetes mellitus without complication   . Anxiety     Allergies  Allergen Reactions  . Codeine Other (See Comments)    incoherent  . Niacin Other (See Comments)    Turns red from chest up. Body hot.  . Pravastatin     Joint pain     No orders of the defined types  were placed in this encounter.    Review of Systems  Constitutional: Positive for fatigue. Negative for fever, chills, activity change, appetite change and unexpected weight change.  Gastrointestinal: Negative for abdominal pain.  Genitourinary: Positive for frequency. Negative for dysuria, urgency, hematuria, flank pain, decreased urine volume, enuresis and difficulty urinating.  Musculoskeletal: Negative for back pain.  Neurological: Negative for numbness.  Psychiatric/Behavioral: Negative for dysphoric mood. The patient is not nervous/anxious.    Triage Vitals: BP 164/96  Pulse 78  Temp(Src) 99.2 F (37.3 C) (Oral)  Resp 20  Ht 5' 6.75" (1.695 m)  Wt 232 lb (105.235 kg)  BMI 36.63 kg/m2  SpO2 99%     Objective:   Physical Exam Physical Exam  Nursing note and vitals reviewed. Constitutional: Patient is oriented to person, place, and time. Patient appears well-developed and well-nourished. No distress.  Head: Normocephalic and atraumatic.  Neck: Neck supple. No tracheal deviation present. Thryroid is normal and no lymph nodes present. Cardiovascular: Normal rate, regular rhythm and normal heart sounds.   No murmur heard. Pulmonary/Chest: Effort normal and breath sounds normal. No respiratory distress. Patient has no wheezes. Patient has no rales.  Musculoskeletal: Normal range of motion.  Neurological: Patient is alert and oriented to person, place, and time.  Skin: Skin is warm and dry.  Psychiatric: Patient has a normal mood and affect. Patient's behavior is normal.    Results for orders placed in visit on 04/15/13  POCT URINALYSIS DIPSTICK      Result Value Range   Color, UA yellow     Clarity, UA clear     Glucose, UA neg     Bilirubin, UA neg     Ketones, UA neg     Spec Grav, UA 1.020     Blood, UA neg     pH, UA 6.5     Protein, UA neg     Urobilinogen, UA 0.2     Nitrite, UA neg     Leukocytes, UA Negative    POCT UA - MICROSCOPIC ONLY      Result Value  Range   WBC, Ur, HPF, POC 0-2     RBC, urine, microscopic 0-1     Bacteria, U Microscopic trace     Mucus, UA neg     Epithelial cells, urine per micros 0-2     Crystals, Ur, HPF, POC neg     Casts, Ur, LPF, POC neg     Yeast, UA neg    POCT GLYCOSYLATED HEMOGLOBIN (HGB A1C)      Result Value Range   Hemoglobin A1C 6.3    GLUCOSE, POCT (MANUAL RESULT ENTRY)      Result Value Range   POC Glucose 69 (*) 70 - 99 mg/dl       Assessment & Plan:   Type II or unspecified type diabetes mellitus without mention of complication, not stated as uncontrolled - Plan: POCT glycosylated hemoglobin (Hb A1C), Microalbumin, urine, POCT glucose (manual entry)  Frequency of urination - Plan: POCT urinalysis dipstick, POCT UA - Microscopic Only  Unspecified essential hypertension - Plan: Comprehensive metabolic panel - BP elevated. D/c hctz 25 and start lisinopril-hctz 20/25. Try to start checking BP outside office.  Unspecified hypothyroidism - Plan: TSH - refill levothyroxine after labs return. Sent pt letter inquiring as to what she would like to try w/ her levothyroxine - tsh in medium range - will refill 100 mcg x 6 mos and recheck in May or if she would like we could try to increase to but would need to recheck in 2-3 mos.  Flu vaccine need - Plan: Flu Vaccine QUAD 36+ mos IM  Need for prophylactic vaccination against Streptococcus pneumoniae (pneumococcus) - Plan: Pneumococcal polysaccharide vaccine 23-valent greater than or equal to 2yo subcutaneous/IM  Diabetes mellitus - Plan: metFORMIN (GLUCOPHAGE) 500 MG tablet - doing great despite pt's disappointment in her recent diet/exercise. Pt encouraged to keep on w/ diet and start exercising. Cont metformin 500 qam.  Gave pt rx for meter/strips/lancets to keep on eye on fasting sugars sev times/wk.  Special screening for malignant neoplasms, colon - can't do through [facility] because her insurance will only pay for the colonoscopy to be done in  house (not at a surgical center) and for preventative/screening purposes.  Screening breast examination - Plan: MM Digital Screening  Meds ordered this encounter  Medications  . metFORMIN (GLUCOPHAGE) 500 MG tablet    Sig: Take 1 tablet (500 mg total) by mouth daily with breakfast.    Dispense:  90 tablet    Refill:  1    Order Specific Question:  Supervising Provider    Answer:  DOOLITTLE, ROBERT P [[id number]]  . lisinopril-hydrochlorothiazide (PRINZIDE,ZESTORETIC) 20-25 MG per tablet    Sig: Take 1 tablet  by mouth daily.    Dispense:  90 tablet    Refill:  1  . Blood Glucose Monitoring Suppl KIT    Sig: Use as directed. Dx: 250.00    Dispense:  1 each    Refill:  0  . Glucose Blood (BLOOD GLUCOSE TEST STRIPS) STRP    Sig: Check blood sugar twice a week. DX: 250.00    Dispense:  100 each    Refill:  0  . Lancets MISC    Sig: Check blood glucose twice a week. DX:250.00    Dispense:  100 each    Refill:  0    I personally performed the services described in this documentation, which was scribed in my presence. The recorded information has been reviewed and considered, and addended by me as needed.  Norberto Sorenson, MD MPH

## 2013-04-16 ENCOUNTER — Encounter: Payer: Self-pay | Admitting: Family Medicine

## 2013-04-16 LAB — TSH: TSH: 2.876 u[IU]/mL (ref 0.350–4.500)

## 2013-04-16 LAB — COMPREHENSIVE METABOLIC PANEL
ALT: 16 U/L (ref 0–35)
AST: 18 U/L (ref 0–37)
Albumin: 4.4 g/dL (ref 3.5–5.2)
Alkaline Phosphatase: 42 U/L (ref 39–117)
BUN: 15 mg/dL (ref 6–23)
CO2: 29 mEq/L (ref 19–32)
Calcium: 9.5 mg/dL (ref 8.4–10.5)
Chloride: 100 mEq/L (ref 96–112)
Creat: 0.8 mg/dL (ref 0.50–1.10)
Glucose, Bld: 85 mg/dL (ref 70–99)
Potassium: 3.5 mEq/L (ref 3.5–5.3)
Sodium: 141 mEq/L (ref 135–145)
Total Bilirubin: 0.6 mg/dL (ref 0.3–1.2)
Total Protein: 7 g/dL (ref 6.0–8.3)

## 2013-04-16 LAB — MICROALBUMIN, URINE: Microalb, Ur: 0.53 mg/dL (ref 0.00–1.89)

## 2013-05-13 ENCOUNTER — Ambulatory Visit: Payer: BC Managed Care – PPO

## 2013-05-14 ENCOUNTER — Ambulatory Visit
Admission: RE | Admit: 2013-05-14 | Discharge: 2013-05-14 | Disposition: A | Discharge status: Home / Self Care | Payer: BC Managed Care – PPO | Source: Ambulatory Visit | Attending: Family Medicine | Admitting: Family Medicine

## 2013-05-14 DIAGNOSIS — Z1239 Encounter for other screening for malignant neoplasm of breast: Secondary | ICD-10-CM

## 2013-07-08 ENCOUNTER — Ambulatory Visit: Payer: BC Managed Care – PPO

## 2013-07-08 ENCOUNTER — Ambulatory Visit (INDEPENDENT_AMBULATORY_CARE_PROVIDER_SITE_OTHER): Payer: BC Managed Care – PPO | Admitting: Family Medicine

## 2013-07-08 VITALS — BP 120/82 | HR 88 | Temp 97.7°F | Resp 18 | Ht 66.5 in | Wt 229.0 lb

## 2013-07-08 DIAGNOSIS — E119 Type 2 diabetes mellitus without complications: Secondary | ICD-10-CM

## 2013-07-08 DIAGNOSIS — M542 Cervicalgia: Secondary | ICD-10-CM

## 2013-07-08 DIAGNOSIS — J029 Acute pharyngitis, unspecified: Secondary | ICD-10-CM

## 2013-07-08 DIAGNOSIS — I10 Essential (primary) hypertension: Secondary | ICD-10-CM

## 2013-07-08 DIAGNOSIS — E039 Hypothyroidism, unspecified: Secondary | ICD-10-CM

## 2013-07-08 DIAGNOSIS — Z823 Family history of stroke: Secondary | ICD-10-CM

## 2013-07-08 LAB — POCT GLYCOSYLATED HEMOGLOBIN (HGB A1C): Hemoglobin A1C: 6.3

## 2013-07-08 LAB — POCT RAPID STREP A (OFFICE): Rapid Strep A Screen: NEGATIVE

## 2013-07-08 MED ORDER — LISINOPRIL-HYDROCHLOROTHIAZIDE 20-25 MG PO TABS: 1.0000 | ORAL_TABLET | Freq: Every day | ORAL | Status: DC

## 2013-07-08 MED ORDER — METFORMIN HCL 500 MG PO TABS: 500.0000 mg | ORAL_TABLET | Freq: Every day | ORAL | Status: DC

## 2013-07-08 MED ORDER — LEVOTHYROXINE SODIUM 100 MCG PO TABS: 100.0000 ug | ORAL_TABLET | Freq: Every day | ORAL | Status: DC

## 2013-07-08 NOTE — Progress Notes (Signed)
Chief Complaint:  Chief Complaint  Patient presents with  . rx refills    metformin, lisinopril  . Hypertension    stress at work   . Neck Pain  . itchy throat and headache    few days     HPI: Connie [name] is a 53 y.o. female who is here to evaluate hypertension, went to heart and health evaluation at [facility] center yesterday, blood pressure was 208/108 at their office. She believes it is elevated due to stress at work. 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 She reports having an associated headache when her blood pressure is elevated. She states she feels like her "head is in the clouds" when her blood pressure is elevated. She denies associated SOB. She states she had a "pulling feeling" across her upper chest this afternoon, but believes that may be due to anxiety. She also reports pain in the shoulder this afternoon, she describes it as "tension". It has since resolved.  She states having a ruptured disc 20 years ago, recently her neck has been bothering her. She reports it "creaking" especially in the morning. She states when she holds her neck a certain way she can see a "vein" pop out and move on the front of her neck.   She would like more information about a diabetes class at [facility], she isn't sure whether she needs a referral to attend this class.  She also reports having a scratchy throat since Wednesday, but she believes it is getting better.    Past Medical History  Diagnosis Date  . Allergic rhinitis   . Hyperlipidemia   . Hypertension   . Lower extremity edema   . Hypothyroid   . Arthritis   . Diabetes mellitus without complication   . Anxiety    Past Surgical History  Procedure Laterality Date  . Cholecystectomy    . Cervical spine surgery      Ruptured disc  . Abdominal hysterectomy  2008  . Oophorectomy  2008  . Joint replacement    . Cesarean section     History   Social History  . Marital  Status: Single    Spouse Name: N/A    Number of Children: N/A  . Years of Education: N/A   Occupational History  . Lost job recently    Social History Main Topics  . Smoking status: Never Smoker   . Smokeless tobacco: None  . Alcohol Use: Yes  . Drug Use: No  . Sexual Activity: No   Other Topics Concern  . None   Social History Narrative   Single mom   Divorced   Does not exercise regularly         Family History  Problem Relation Age of Onset  . Stroke Mother   . Heart disease Mother   . Coronary artery disease Neg Hx     Premature or sudden cardiac death  . Diabetes Other     1st degree relative  . Hypertension Other   . Pancreatic cancer Other   . Stroke Other     Grandmother  . Heart disease Maternal Grandmother   . Diabetes Maternal Grandmother   . Heart disease Maternal Grandfather   . Cancer Paternal Grandmother    Allergies  Allergen Reactions  . Codeine Other (See Comments)    incoherent  . Niacin Other (See Comments)    Turns red from chest up. Body hot.  Marland Kitchen  Pravastatin     Joint pain    Prior to Admission medications   Medication Sig Start Date End Date Taking? Authorizing Provider  aspirin EC 81 MG tablet Take 81 mg by mouth daily.    Yes Historical Provider, MD  Blood Glucose Monitoring Suppl KIT Use as directed. Dx: 250.00 04/15/13  Yes [name], MD  Clobetasol Propionate 0.05 % lotion Apply 1 application topically 2 (two) times daily. 02/10/13  Yes Robyn Haber, MD  Fluocinolone Acetonide 0.01 % OIL Place 5 drops in ear(s) 2 (two) times daily. For 7d as needed 08/27/12  Yes [name], MD  Glucose Blood (BLOOD GLUCOSE TEST STRIPS) STRP Check blood sugar twice a week. DX: 250.00 04/15/13  Yes [name], MD  glucose blood test strip Use as instructed to check cbgs. [facility] Truetest Strips 07/04/12  Yes [name], MD  ipratropium (ATROVENT) 0.06 % nasal spray Place 2 sprays into the nose 4 (four) times daily. 02/10/13  Yes Robyn Haber, MD   Lancets MISC Check blood glucose twice a week. DX:250.00 04/15/13  Yes [name], MD  levothyroxine (SYNTHROID, LEVOTHROID) 100 MCG tablet Take 1 tablet (100 mcg total) by mouth daily. 02/10/13  Yes Robyn Haber, MD  lisinopril-hydrochlorothiazide (PRINZIDE,ZESTORETIC) 20-25 MG per tablet Take 1 tablet by mouth daily. 04/15/13  Yes [name], MD  Loratadine (CLARITIN PO) Take by mouth.   Yes Historical Provider, MD  metFORMIN (GLUCOPHAGE) 500 MG tablet Take 1 tablet (500 mg total) by mouth daily with breakfast. 04/15/13  Yes [name], MD  nystatin (MYCOSTATIN) powder Apply topically 4 (four) times daily. 06/04/12  Yes [name], MD  polyethylene glycol powder (GLYCOLAX/MIRALAX) powder Take 17 g by mouth 2 (two) times daily as needed. 08/27/12  Yes [name], MD     ROS: The patient denies fevers, chills, night sweats, unintentional weight loss, chest pain, palpitations, wheezing, dyspnea on exertion, nausea, vomiting, abdominal pain, dysuria, hematuria, melena, numbness, weakness, or tingling.   All other systems have been reviewed and were otherwise negative with the exception of those mentioned in the HPI and as above.    PHYSICAL EXAM: Filed Vitals:   07/08/13 1909  BP: 120/82  Pulse: 88  Temp: 97.7 F (36.5 C)  Resp: 18   Filed Vitals:   07/08/13 1909  Height: 5' 6.5" (1.689 m)  Weight: 229 lb (103.874 kg)   Body mass index is 36.41 kg/(m^2).  General: Alert, no acute distress HEENT:  Normocephalic, atraumatic, oropharynx patent. EOMI, PERRLA Cardiovascular:  Regular rate and rhythm, no rubs murmurs or gallops.  No Carotid bruits, radial pulse intact. No pedal edema.  Respiratory: Clear to auscultation bilaterally.  No wheezes, rales, or rhonchi.  No cyanosis, no use of accessory musculature GI: No organomegaly, abdomen is soft and non-tender, positive bowel sounds.  No masses. Skin: No rashes. Neurologic: Facial musculature symmetric. Microflimanet exam is normal.    Psychiatric: Patient is appropriate throughout our interaction. Lymphatic: No cervical lymphadenopathy Musculoskeletal: Gait intact.   LABS: Results for orders placed in visit on 07/08/13  POCT GLYCOSYLATED HEMOGLOBIN (HGB A1C)      Result Value Ref Range   Hemoglobin A1C 6.3    POCT RAPID STREP A (OFFICE)      Result Value Ref Range   Rapid Strep A Screen Negative  Negative     EKG/XRAY:   Primary read interpreted by Dr. Marin Comment at [facility]. + DJD. No fractures or dislocation  ASSESSMENT/PLAN: Encounter Diagnoses  Name Primary?  . HTN (hypertension) Yes  . Diabetes   . Acute pharyngitis   . Unspecified hypothyroidism   . Neck pain   . Family history of CVA   . Diabetes mellitus   . Hypothyroid    Refilled meds: Prinizide, Metformin She will go to anger management class and see if her BP does not get better with that , she only has elevated BP when she gets upset at work, when she leaves work she is ok, It is sometimes and not all the time. However she is afraid of having stroke because of nuances in her job. We willt alk about diff stress related meds if her calss doe snot work out.  Will get carotid doppler d/t family history of CVA Recommend : ADA diet, BP goal <140/90, daily foot exams, tobacco cessation if smoking, annual eye exam, annual flu vaccine, PNA vaccine if age and time appropriate.  F/u in 3 months   Gross sideeffects, risk and benefits, and alternatives of medications d/w patient. Patient is aware that all medications have potential sideeffects and we are unable to predict every sideeffect or drug-drug interaction that may occur.  LE, [place], DO 07/08/2013 8:47 PM

## 2013-07-09 ENCOUNTER — Telehealth: Payer: Self-pay | Admitting: Family Medicine

## 2013-07-09 LAB — COMPREHENSIVE METABOLIC PANEL
ALT: 11 U/L (ref 0–35)
AST: 14 U/L (ref 0–37)
Albumin: 4.4 g/dL (ref 3.5–5.2)
Alkaline Phosphatase: 43 U/L (ref 39–117)
BUN: 17 mg/dL (ref 6–23)
CO2: 29 mEq/L (ref 19–32)
Calcium: 9.3 mg/dL (ref 8.4–10.5)
Chloride: 99 mEq/L (ref 96–112)
Creat: 1.01 mg/dL (ref 0.50–1.10)
Glucose, Bld: 100 mg/dL — ABNORMAL HIGH (ref 70–99)
Potassium: 4 mEq/L (ref 3.5–5.3)
Sodium: 141 mEq/L (ref 135–145)
Total Bilirubin: 0.7 mg/dL (ref 0.2–1.2)
Total Protein: 6.8 g/dL (ref 6.0–8.3)

## 2013-07-09 NOTE — Telephone Encounter (Signed)
LM about official xray results

## 2013-09-13 ENCOUNTER — Ambulatory Visit (INDEPENDENT_AMBULATORY_CARE_PROVIDER_SITE_OTHER): Payer: BC Managed Care – PPO | Admitting: Family Medicine

## 2013-09-13 VITALS — BP 128/80 | HR 83 | Temp 98.3°F | Resp 17 | Ht 67.0 in | Wt 231.0 lb

## 2013-09-13 DIAGNOSIS — R0981 Nasal congestion: Secondary | ICD-10-CM

## 2013-09-13 DIAGNOSIS — M25539 Pain in unspecified wrist: Secondary | ICD-10-CM

## 2013-09-13 DIAGNOSIS — E119 Type 2 diabetes mellitus without complications: Secondary | ICD-10-CM

## 2013-09-13 DIAGNOSIS — J3489 Other specified disorders of nose and nasal sinuses: Secondary | ICD-10-CM

## 2013-09-13 DIAGNOSIS — M779 Enthesopathy, unspecified: Secondary | ICD-10-CM

## 2013-09-13 DIAGNOSIS — M25532 Pain in left wrist: Secondary | ICD-10-CM

## 2013-09-13 LAB — COMPREHENSIVE METABOLIC PANEL
ALT: 10 U/L (ref 0–35)
AST: 15 U/L (ref 0–37)
Albumin: 4.5 g/dL (ref 3.5–5.2)
Alkaline Phosphatase: 44 U/L (ref 39–117)
BUN: 16 mg/dL (ref 6–23)
CO2: 26 mEq/L (ref 19–32)
Calcium: 9 mg/dL (ref 8.4–10.5)
Chloride: 102 mEq/L (ref 96–112)
Creat: 1.03 mg/dL (ref 0.50–1.10)
Glucose, Bld: 108 mg/dL — ABNORMAL HIGH (ref 70–99)
Potassium: 4 mEq/L (ref 3.5–5.3)
Sodium: 139 mEq/L (ref 135–145)
Total Bilirubin: 0.8 mg/dL (ref 0.2–1.2)
Total Protein: 6.7 g/dL (ref 6.0–8.3)

## 2013-09-13 NOTE — Progress Notes (Signed)
Chief Complaint:  Chief Complaint  Patient presents with  . Abdominal Pain  . Wrist Pain  . Sinusitis    HPI: Connie [name] is a 53 y.o. female who is here for :  1. FMLA paperwork to be filled out. She is being seen by GI but they told her to return to her FP to get FMLA paperwork done, although most of her FMLA will be related to GI issues. She had on April 16 colonoscopy  And was found to have  Polyps, she also  had hemorrhoid banding and will be having these a little bit at a time. Dr Ferdinand Lango did her procedure. 2 days after her banding , she had discomfort and pushed when she was not supposed to and she started having right lower quadrant cramps and diffuse radiation, now has subsided. She recently had a CT of her abdomen because this Thursday, she does not know the results. Since she ahd the CT scan for her RLQ abd paina nd vomitinga t that time, she was told to get her kidneys rechecked sicne she has DM and is on metformin.   She also has significant reflux and is scheduled for an endoscopy soon. This maybe realted to her normal GERD and stress at work. She finally told her job that she could not be doing 2-3 people's jobs and that she is overworked and stressed out, her BP gets high at work when there is too much job stress. She is an account specialist.   2. She has left wrist pain and has used a sleeve but when she ulnar devaites it hurts and is sharp. Itnermittent.   3. She has allergies, denies fevers, chills. Taking claritin, no cough sxs or ear pain     Past Medical History  Diagnosis Date  . Allergic rhinitis   . Hyperlipidemia   . Hypertension   . Lower extremity edema   . Hypothyroid   . Arthritis   . Diabetes mellitus without complication   . Anxiety    Past Surgical History  Procedure Laterality Date  . Cholecystectomy    . Cervical spine surgery      Ruptured disc  . Abdominal hysterectomy  2008  . Oophorectomy  2008  . Joint replacement    .  Cesarean section     History   Social History  . Marital Status: Single    Spouse Name: N/A    Number of Children: N/A  . Years of Education: N/A   Occupational History  . Lost job recently    Social History Main Topics  . Smoking status: Never Smoker   . Smokeless tobacco: None  . Alcohol Use: Yes  . Drug Use: No  . Sexual Activity: No   Other Topics Concern  . None   Social History Narrative   Single mom   Divorced   Does not exercise regularly         Family History  Problem Relation Age of Onset  . Stroke Mother   . Heart disease Mother   . Coronary artery disease Neg Hx     Premature or sudden cardiac death  . Diabetes Other     1st degree relative  . Hypertension Other   . Pancreatic cancer Other   . Stroke Other     Grandmother  . Heart disease Maternal Grandmother   . Diabetes Maternal Grandmother   . Heart disease Maternal Grandfather   . Cancer Paternal Grandmother  Allergies  Allergen Reactions  . Codeine Other (See Comments)    incoherent  . Niacin Other (See Comments)    Turns red from chest up. Body hot.  . Pravastatin     Joint pain    Prior to Admission medications   Medication Sig Start Date End Date Taking? Authorizing Provider  aspirin EC 81 MG tablet Take 81 mg by mouth daily.    Yes Historical Provider, MD  Blood Glucose Monitoring Suppl KIT Use as directed. Dx: 250.00 04/15/13  Yes [name], MD  Clobetasol Propionate 0.05 % lotion Apply 1 application topically 2 (two) times daily. 02/10/13  Yes Robyn Haber, MD  Fluocinolone Acetonide 0.01 % OIL Place 5 drops in ear(s) 2 (two) times daily. For 7d as needed 08/27/12  Yes [name], MD  Glucose Blood (BLOOD GLUCOSE TEST STRIPS) STRP Check blood sugar twice a week. DX: 250.00 04/15/13  Yes [name], MD  glucose blood test strip Use as instructed to check cbgs. [facility] Truetest Strips 07/04/12  Yes [name], MD  ipratropium (ATROVENT) 0.06 % nasal spray Place 2 sprays into  the nose 4 (four) times daily. 02/10/13  Yes Robyn Haber, MD  Lancets MISC Check blood glucose twice a week. DX:250.00 04/15/13  Yes [name], MD  levothyroxine (SYNTHROID, LEVOTHROID) 100 MCG tablet Take 1 tablet (100 mcg total) by mouth daily. 07/08/13  Yes Thao P Le, DO  lisinopril-hydrochlorothiazide (PRINZIDE,ZESTORETIC) 20-25 MG per tablet Take 1 tablet by mouth daily. 07/08/13  Yes Thao P Le, DO  Loratadine (CLARITIN PO) Take by mouth.   Yes Historical Provider, MD  metFORMIN (GLUCOPHAGE) 500 MG tablet Take 1 tablet (500 mg total) by mouth daily with breakfast. 07/08/13  Yes Thao P Le, DO  nystatin (MYCOSTATIN) powder Apply topically 4 (four) times daily. 06/04/12  Yes [name], MD  polyethylene glycol powder (GLYCOLAX/MIRALAX) powder Take 17 g by mouth 2 (two) times daily as needed. 08/27/12  Yes [name], MD     ROS: The patient denies fevers, chills, night sweats, unintentional weight loss, chest pain, palpitations, wheezing, dyspnea on exertion, , dysuria, hematuria, melena, numbness, weakness, or tingling.   All other systems have been reviewed and were otherwise negative with the exception of those mentioned in the HPI and as above.    PHYSICAL EXAM: Filed Vitals:   09/13/13 0847  BP: 128/80  Pulse: 83  Temp: 98.3 F (36.8 C)  Resp: 17   Filed Vitals:   09/13/13 0847  Height: 5' 7"  (1.702 m)  Weight: 231 lb (104.781 kg)   Body mass index is 36.17 kg/(m^2).  General: Alert, no acute distress but anxious female HEENT:  Normocephalic, atraumatic, oropharynx patent. EOMI, PERRLA TM nl, no exudates, nin boggy nares Cardiovascular:  Regular rate and rhythm, no rubs murmurs or gallops.  No Carotid bruits, radial pulse intact. No pedal edema.  Respiratory: Clear to auscultation bilaterally.  No wheezes, rales, or rhonchi.  No cyanosis, no use of accessory musculature GI: No organomegaly, abdomen is soft and non-tender, positive bowel sounds.  No masses. Skin: No  rashes. Neurologic: Facial musculature symmetric. Psychiatric: Patient is appropriate throughout our interaction. Lymphatic: No cervical lymphadenopathy Musculoskeletal: Gait intact.   LABS: Results for orders placed in visit on 09/13/13  COMPREHENSIVE METABOLIC PANEL      Result Value Ref Range   Sodium 139  135 - 145 mEq/L   Potassium 4.0  3.5 - 5.3 mEq/L   Chloride  102  96 - 112 mEq/L   CO2 26  19 - 32 mEq/L   Glucose, Bld 108 (*) 70 - 99 mg/dL   BUN 16  6 - 23 mg/dL   Creat 1.03  0.50 - 1.10 mg/dL   Total Bilirubin 0.8  0.2 - 1.2 mg/dL   Alkaline Phosphatase 44  39 - 117 U/L   AST 15  0 - 37 U/L   ALT 10  0 - 35 U/L   Total Protein 6.7  6.0 - 8.3 g/dL   Albumin 4.5  3.5 - 5.2 g/dL   Calcium 9.0  8.4 - 10.5 mg/dL     EKG/XRAY:   Primary read interpreted by Dr. Marin Comment at [facility].   ASSESSMENT/PLAN: Encounter Diagnoses  Name Primary?  . Left wrist pain Yes  . Type II or unspecified type diabetes mellitus without mention of complication, not stated as uncontrolled   . Tendonitis   . Sinus congestion    I advise her to continue wearing her wrist sleeve sicne it works for her Continue with symptomactic treatment for sinuses otc nasacrot and calritin Will get CMP to check kidneys for impaired kidney fucntion from contrast, advise that she can still use meftformin, I don't suspect that the contrast impaired her kidneys  Will fill out FMLA paperwork F/u with Dr Brigitte Pulse as directed for DM follow-up.    Gross sideeffects, risk and benefits, and alternatives of medications d/w patient. Patient is aware that all medications have potential sideeffects and we are unable to predict every sideeffect or drug-drug interaction that may occur.  Thao P Le, DO 09/15/2013 11:12 AM

## 2013-09-15 ENCOUNTER — Telehealth: Payer: Self-pay | Admitting: Family Medicine

## 2013-09-15 NOTE — Telephone Encounter (Signed)
Dr. Marin Comment,   You saw this patient on 09/13/2013. Did she give you any FMLA/Disability ppw during her OV? The form that patient signed indicating she paid to have paperwork completed was in my tray but no papers were attached to it.   Thanks,  [employer]

## 2013-09-15 NOTE — Telephone Encounter (Signed)
I will have to give this to you once I am done filling it out

## 2013-10-14 DIAGNOSIS — K648 Other hemorrhoids: Secondary | ICD-10-CM | POA: Insufficient documentation

## 2013-10-29 LAB — HM SIGMOIDOSCOPY

## 2013-11-17 ENCOUNTER — Encounter: Payer: Self-pay | Admitting: Family Medicine

## 2013-11-17 DIAGNOSIS — K625 Hemorrhage of anus and rectum: Secondary | ICD-10-CM

## 2013-11-17 DIAGNOSIS — K648 Other hemorrhoids: Secondary | ICD-10-CM

## 2013-11-17 HISTORY — DX: Hemorrhage of anus and rectum: K62.5

## 2013-11-25 ENCOUNTER — Ambulatory Visit (INDEPENDENT_AMBULATORY_CARE_PROVIDER_SITE_OTHER): Payer: 59 | Admitting: Family Medicine

## 2013-11-25 VITALS — BP 136/88 | HR 70 | Temp 98.1°F | Resp 20 | Ht 66.5 in | Wt 233.0 lb

## 2013-11-25 DIAGNOSIS — E119 Type 2 diabetes mellitus without complications: Secondary | ICD-10-CM

## 2013-11-25 DIAGNOSIS — E039 Hypothyroidism, unspecified: Secondary | ICD-10-CM

## 2013-11-25 DIAGNOSIS — J019 Acute sinusitis, unspecified: Secondary | ICD-10-CM

## 2013-11-25 DIAGNOSIS — K219 Gastro-esophageal reflux disease without esophagitis: Secondary | ICD-10-CM

## 2013-11-25 LAB — POCT GLYCOSYLATED HEMOGLOBIN (HGB A1C): Hemoglobin A1C: 6.3

## 2013-11-25 MED ORDER — METFORMIN HCL 500 MG PO TABS: 500.0000 mg | ORAL_TABLET | Freq: Every day | ORAL | Status: DC

## 2013-11-25 MED ORDER — CETIRIZINE HCL 10 MG PO TABS
10.0000 mg | ORAL_TABLET | Freq: Every day | ORAL | Status: DC
Start: 2013-11-25 — End: 2014-01-27

## 2013-11-25 MED ORDER — GUAIFENESIN ER 1200 MG PO TB12: 1.0000 | ORAL_TABLET | Freq: Two times a day (BID) | ORAL | Status: DC

## 2013-11-25 MED ORDER — LISINOPRIL-HYDROCHLOROTHIAZIDE 20-25 MG PO TABS: 1.0000 | ORAL_TABLET | Freq: Every day | ORAL | Status: DC

## 2013-11-25 MED ORDER — FLUTICASONE PROPIONATE 50 MCG/ACT NA SUSP: 2.0000 | Freq: Every day | NASAL | Status: DC

## 2013-11-25 NOTE — Progress Notes (Signed)
Subjective:    Patient ID: Connie [name], female    DOB: Feb 14, 1961, 53 y.o.   MRN: [id number] This chart was scribed for [name], MD by Vernell Barrier, Medical Scribe. The patient was seen in room 13. This patient's care was started at 8:20 PM.  Chief Complaint  Patient presents with  . Hyperglycemia    has been sick with sinus infection and using otc meds.  feeling lightheaded and her ears have been feeling stopped up.     Hyperglycemia Associated symptoms include abdominal pain, congestion, coughing, fatigue, headaches and a sore throat. Pertinent negatives include no chills, fever, nausea or vomiting.   HPI Comments: Connie [name] is a 53 y.o. female who presents to the [facility] with lingering sinus infection; onset 4 weeks ago. Fever and chills initially that has since resolved. Started Clartin 1 week ago which was working to resolve sxs. Also using nasal spray. Still reports lightheadedness and ear congestion. Occasional sore throat and cough. Clear rhinorrhea.    Would also like glucose checked. A1C on 2/11 was 6.3. Unchanged from prior november visit. CMP normal in April. TSH normal in November. States she had a smoothie yesterday without realizing how much sugar was in it. Checked sugar after and states it was 171. The day before yesterday was 173. Says she gets jittery without eating. Does not check sugar as regulary as she should. Reports some HA today which is normal when blood sugar is elevating. Occasional belching. Some bloating; worse in the morning. BM are intermittently hard and soft; today reports soft stool.   Patient Active Problem List   Diagnosis Date Noted  . Hemorrhage of rectum and anus 11/17/2013  . Internal hemorrhoids without mention of complication 18/84/1660  . Diabetes mellitus 06/04/2012  . Candidiasis of skin 06/04/2012  . Hypothyroid 03/19/2012  . ACUTE SINUSITIS, UNSPECIFIED 11/28/2007  . Pain in Soft Tissues of Limb  11/19/2007  . OTITIS EXTERNA, RIGHT 07/07/2007  . ANXIETY DEPRESSION 06/13/2007  . MENOPAUSE, SURGICAL 06/13/2007  . RASH-NONVESICULAR 06/13/2007  . HYPERLIPIDEMIA 09/17/2006  . HYPERTENSION 09/17/2006  . ALLERGIC RHINITIS 09/17/2006   Past Medical History  Diagnosis Date  . Allergic rhinitis   . Hyperlipidemia   . Hypertension   . Lower extremity edema   . Hypothyroid   . Arthritis   . Diabetes mellitus without complication   . Anxiety    Past Surgical History  Procedure Laterality Date  . Cholecystectomy    . Cervical spine surgery      Ruptured disc  . Abdominal hysterectomy  2008  . Oophorectomy  2008  . Joint replacement    . Cesarean section     Allergies  Allergen Reactions  . Codeine Other (See Comments)    incoherent  . Niacin Other (See Comments)    Turns red from chest up. Body hot.  . Pravastatin     Joint pain    Prior to Admission medications   Medication Sig Start Date End Date Taking? Authorizing Provider  Blood Glucose Monitoring Suppl KIT Use as directed. Dx: 250.00 04/15/13  Yes [name], MD  Clobetasol Propionate 0.05 % lotion Apply 1 application topically 2 (two) times daily. 02/10/13  Yes Robyn Haber, MD  Fluocinolone Acetonide 0.01 % OIL Place 5 drops in ear(s) 2 (two) times daily. For 7d as needed 08/27/12  Yes [name], MD  Glucose Blood (BLOOD GLUCOSE TEST STRIPS) STRP Check blood sugar twice a week. DX:  250.00 04/15/13  Yes [name], MD  glucose blood test strip Use as instructed to check cbgs. [facility] Truetest Strips 07/04/12  Yes [name], MD  ipratropium (ATROVENT) 0.06 % nasal spray Place 2 sprays into the nose 4 (four) times daily. 02/10/13  Yes Robyn Haber, MD  Lancets MISC Check blood glucose twice a week. DX:250.00 04/15/13  Yes [name], MD  levothyroxine (SYNTHROID, LEVOTHROID) 100 MCG tablet Take 1 tablet (100 mcg total) by mouth daily. 07/08/13  Yes Thao P Le, DO  lisinopril-hydrochlorothiazide  (PRINZIDE,ZESTORETIC) 20-25 MG per tablet Take 1 tablet by mouth daily. 07/08/13  Yes Thao P Le, DO  Loratadine (CLARITIN PO) Take by mouth.   Yes Historical Provider, MD  metFORMIN (GLUCOPHAGE) 500 MG tablet Take 1 tablet (500 mg total) by mouth daily with breakfast. 07/08/13  Yes Thao P Le, DO  nystatin (MYCOSTATIN) powder Apply topically 4 (four) times daily. 06/04/12  Yes [name], MD  polyethylene glycol powder (GLYCOLAX/MIRALAX) powder Take 17 g by mouth 2 (two) times daily as needed. 08/27/12  Yes [name], MD   History   Social History  . Marital Status: Single    Spouse Name: N/A    Number of Children: N/A  . Years of Education: N/A   Occupational History  . Lost job recently    Social History Main Topics  . Smoking status: Never Smoker   . Smokeless tobacco: Not on file  . Alcohol Use: Yes  . Drug Use: No  . Sexual Activity: No   Other Topics Concern  . Not on file   Social History Narrative   Single mom   Divorced   Does not exercise regularly         Review of Systems  Constitutional: Positive for activity change, appetite change and fatigue. Negative for fever, chills and unexpected weight change.  HENT: Positive for congestion, hearing loss, postnasal drip, rhinorrhea, sinus pressure and sore throat. Negative for ear discharge, ear pain, tinnitus and trouble swallowing.   Respiratory: Positive for cough.   Gastrointestinal: Positive for abdominal pain, diarrhea, constipation and abdominal distention. Negative for nausea and vomiting.  Endocrine: Negative for polydipsia, polyphagia and polyuria.  Neurological: Positive for light-headedness and headaches. Negative for dizziness and facial asymmetry.  Psychiatric/Behavioral: Positive for dysphoric mood.   Triage vitals: BP 136/88  Pulse 70  Temp(Src) 98.1 F (36.7 C) (Oral)  Resp 20  Ht 5' 6.5" (1.689 m)  Wt 233 lb (105.688 kg)  BMI 37.05 kg/m2  SpO2 98%   Objective:  Physical Exam  Vitals  reviewed. Constitutional: She is oriented to person, place, and time. She appears well-developed and well-nourished. No distress.  HENT:  Head: Normocephalic and atraumatic.  Right Ear: External ear normal.  Left Ear: External ear normal.  Mouth/Throat: Oropharynx is clear and moist.  Pale boggy nasal mucosa.  Eyes: Conjunctivae and EOM are normal.  Neck: Neck supple. No tracheal deviation present. No mass and no thyromegaly present.  Mild Submandibular and tonsillar adenopathy. Equal bilaterally. No supraclavicular adenopathy  Cardiovascular: Normal rate, regular rhythm and normal heart sounds.  Exam reveals no gallop and no friction rub.   No murmur heard. Pulmonary/Chest: Effort normal and breath sounds normal. No respiratory distress. She has no wheezes.  Musculoskeletal: Normal range of motion.  Trace pital edema in bilateral lower extremities.   Lymphadenopathy:    She has cervical adenopathy (mild).  Neurological: She is alert and oriented to person, place, and  time.  Skin: Skin is warm and dry.  Psychiatric: She has a normal mood and affect. Her behavior is normal.   Lab Results  Component Value Date   HGBA1C 6.3 11/25/2013    Assessment & Plan:  Acute sinusitis, recurrence not specified, unspecified location - Plan: CBC  Gastroesophageal reflux disease, esophagitis presence not specified - Questioned if Reglan would help improve GI sxs. Do not have any notes from GI. Patient is seen at [facility] by Dr. Ferdinand Lango. Ask patient to send records to see if she's been tested for H. Pylori.  Type 2 diabetes mellitus without complication - Plan: metFORMIN (GLUCOPHAGE) 500 MG tablet - very stable - cont metformin qam for now as no sig side effects and pt needing to loose weight.  DM well controlled and pt going to work harder on diet/exercise.  Hypothyroidism, unspecified hypothyroidism type - Plan: levothyroxine (SYNTHROID, LEVOTHROID) 112 MCG tablet - increase levothyroxine 100 to 112 as  tsh>5 and pt symptomatic - recheck 6-8 wks after starting new dose.  HTN - cont current BP med lisinopril-hctz 20/25.  Meds ordered this encounter  Medications  . cetirizine (ZYRTEC) 10 MG tablet    Sig: Take 1 tablet (10 mg total) by mouth daily.    Dispense:  30 tablet    Refill:  1  . fluticasone (FLONASE) 50 MCG/ACT nasal spray    Sig: Place 2 sprays into both nostrils at bedtime.    Dispense:  16 g    Refill:  2  . Guaifenesin 1200 MG TB12    Sig: Take 1 tablet (1,200 mg total) by mouth 2 (two) times daily.    Dispense:  14 each    Refill:  0  . lisinopril-hydrochlorothiazide (PRINZIDE,ZESTORETIC) 20-25 MG per tablet    Sig: Take 1 tablet by mouth daily.    Dispense:  90 tablet    Refill:  1  . metFORMIN (GLUCOPHAGE) 500 MG tablet    Sig: Take 1 tablet (500 mg total) by mouth daily with breakfast.    Dispense:  90 tablet    Refill:  1    Order Specific Question:  Supervising Provider    Answer:  DOOLITTLE, ROBERT P [[id number]]  . levothyroxine (SYNTHROID, LEVOTHROID) 112 MCG tablet    Sig: Take 1 tablet (112 mcg total) by mouth daily before breakfast.    Dispense:  90 tablet    Refill:  0    I personally performed the services described in this documentation, which was scribed in my presence. The recorded information has been reviewed and considered, and addended by me as needed.  [name], MD MPH

## 2013-11-25 NOTE — Patient Instructions (Addendum)
Please make sure that you have been tested for H. Pylori by the GI doctors - if not, we can do that test here but that could be a cause of your sxs. Consider decreasing your diet in gluten and lactose.  Hot showers or breathing in steam may help loosen the congestion.  Using a netti pot or sinus rinse is also likely to help you feel better and keep this from progressing.  Use the fluticasone nasal spray every night before bed for at least 2 weeks.  I recommend augmenting with 12 hr sudafed (behind the counter) and generic mucinex to help you move out the congestion.  If no improvement or you are getting worse, come back as you might need a course of steroids but hopefully with all of the above, you can avoid it.

## 2013-11-26 LAB — CBC
HCT: 38.6 % (ref 36.0–46.0)
Hemoglobin: 13.2 g/dL (ref 12.0–15.0)
MCH: 29.3 pg (ref 26.0–34.0)
MCHC: 34.2 g/dL (ref 30.0–36.0)
MCV: 85.8 fL (ref 78.0–100.0)
Platelets: 281 10*3/uL (ref 150–400)
RBC: 4.5 MIL/uL (ref 3.87–5.11)
RDW: 14.8 % (ref 11.5–15.5)
WBC: 7.9 10*3/uL (ref 4.0–10.5)

## 2013-11-26 LAB — TSH: TSH: 5.362 u[IU]/mL — ABNORMAL HIGH (ref 0.350–4.500)

## 2013-12-13 ENCOUNTER — Encounter: Payer: Self-pay | Admitting: Family Medicine

## 2013-12-13 MED ORDER — LEVOTHYROXINE SODIUM 112 MCG PO TABS
112.0000 ug | ORAL_TABLET | Freq: Every day | ORAL | Status: DC
Start: 2013-12-13 — End: 2014-04-02

## 2013-12-30 DIAGNOSIS — Z0271 Encounter for disability determination: Secondary | ICD-10-CM

## 2014-01-27 ENCOUNTER — Other Ambulatory Visit: Payer: Self-pay | Admitting: Family Medicine

## 2014-04-02 ENCOUNTER — Other Ambulatory Visit: Payer: Self-pay | Admitting: Family Medicine

## 2014-05-13 ENCOUNTER — Ambulatory Visit (INDEPENDENT_AMBULATORY_CARE_PROVIDER_SITE_OTHER): Payer: 59 | Admitting: Family Medicine

## 2014-05-13 VITALS — BP 110/80 | HR 81 | Temp 98.2°F | Resp 16 | Ht 66.5 in | Wt 236.0 lb

## 2014-05-13 DIAGNOSIS — J302 Other seasonal allergic rhinitis: Secondary | ICD-10-CM

## 2014-05-13 DIAGNOSIS — I10 Essential (primary) hypertension: Secondary | ICD-10-CM | POA: Diagnosis not present

## 2014-05-13 DIAGNOSIS — E1142 Type 2 diabetes mellitus with diabetic polyneuropathy: Secondary | ICD-10-CM | POA: Diagnosis not present

## 2014-05-13 DIAGNOSIS — M6248 Contracture of muscle, other site: Secondary | ICD-10-CM

## 2014-05-13 DIAGNOSIS — E038 Other specified hypothyroidism: Secondary | ICD-10-CM

## 2014-05-13 DIAGNOSIS — Z23 Encounter for immunization: Secondary | ICD-10-CM | POA: Diagnosis not present

## 2014-05-13 DIAGNOSIS — M62838 Other muscle spasm: Secondary | ICD-10-CM

## 2014-05-13 LAB — POCT URINALYSIS DIPSTICK
Bilirubin, UA: NEGATIVE
Blood, UA: NEGATIVE
Glucose, UA: NEGATIVE
Ketones, UA: NEGATIVE
Nitrite, UA: NEGATIVE
Protein, UA: NEGATIVE
Spec Grav, UA: 1.025
Urobilinogen, UA: 0.2
pH, UA: 6

## 2014-05-13 LAB — POCT GLYCOSYLATED HEMOGLOBIN (HGB A1C): Hemoglobin A1C: 6.6

## 2014-05-13 MED ORDER — METHOCARBAMOL 500 MG PO TABS: 500.0000 mg | ORAL_TABLET | Freq: Every evening | ORAL | Status: DC | PRN

## 2014-05-13 NOTE — Progress Notes (Signed)
Patient ID: Connie [name], female   DOB: [birth year], 53 y.o.   MRN: [id number]    Subjective:    Patient ID: Connie [name], female    DOB: [birth year], 53 y.o.   MRN: [id number]  05/13/2014   This chart was scribed for Wardell Honour, MD by Stephania Fragmin, ED Scribe. This patient was seen in room 9 and the patient's care was started at 6:53 PM.   Medication Refill; Skin Problem; and Flu Vaccine   HPI   HPI Comments: Connie [name] is a 53 y.o. female who presents to [facility] for a Synthroid/medication refill, mole removal on her right lateral abdomen, and flu vaccine. Patient has annual dermatology exams where she has moles removed; they are usually benign.  Patient also complains of a frequently occurring, worsening, pulsating sensation in her neck that feels like a cramp. It co-occurs with twitching in her right cheek that began 2 weeks ago. Patient confirms chest pain with stress, DOE that hasn't been too bad recently, diabetic nerve pain in her feet recently, hemorrhoids that she is trying to deal with, and that her throat closes up somewhat with crying, laughing, etc. She has daily BMs. When mentioning that patient seemed tender around her neck, patient replied that she has a left-sided cracked tooth that occurs intermittently with sinus pressure.  Patient denies headaches and neck pain.  Patient discussed neck cramping with Dr. 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  A carotid duplex was ordered during that visit as well but was not completed.   Office Visits History:  Patient last saw Dr. Brigitte Pulse in July. Her last eye exam was done in August or September, with the early stages of cataracts seen but everything else normal. Dr. Ulanda Edison is her OB/GYN. She had a mammogram done recently and is awaiting results.    Personal, Surgical, and Family History: Patient has well-controlled DM. She doesn't  take her sugar, but she takes her medication "religiously" - Metformin 1 x day. Patient is compliant with Lisinopril, but she denies taking BP readings at home. She is taking her Claritin, Flonase, nystatin powder, and Miralax. Patient confirms using several topical creams, with one for severe psoriasis in her scalp. She also applies oil in her ears for severe dryness as needed, and reports no otalgia.   Patient has a history of spinal surgery for a ruptured disc in vertebrae 4-5. They had found 1 fusion that she has had since she was young.   Patient's mother is 65 years old. She has a history of CVA that occurred when she was about 71-40 years old. Patient's aunt has a history of double coronary-bypass surgery when she was about 7.  Patient's maternal grandma may have had DM. Patient's father died of unknown causes, though she think it may be cancer.    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 She states that she feels good but gained some weight. Patient is divorced. She denies dating, and states that she is celibate. She has two daughters, who are 54 and 37 years old. Patient denies smoking. She drinks alcohol occasionally, with a maximum of 2 drinks at a time. She is trying to minimize her caffeine and sugar intake for DM and drinks 1 cup of coffee in the morning. Patient denies  exercise.    Review of Systems  Constitutional: Negative for fever, chills, diaphoresis and fatigue.  HENT: Positive for dental problem.   Eyes: Negative for visual disturbance.  Respiratory: Positive for shortness of breath (with exertion). Negative for cough.   Cardiovascular: Positive for chest pain (with stress). Negative for palpitations and leg swelling.  Gastrointestinal: Positive for rectal pain (with hemorrhoids). Negative for nausea, vomiting, abdominal pain, diarrhea and constipation.    Endocrine: Negative for cold intolerance, heat intolerance, polydipsia, polyphagia and polyuria.  Genitourinary: Negative for urgency and frequency.  Musculoskeletal: Positive for myalgias and neck pain. Negative for neck stiffness.  Skin: Positive for color change. Negative for pallor, rash and wound.  Neurological: Negative for dizziness, tremors, seizures, syncope, facial asymmetry, speech difficulty, weakness, light-headedness, numbness and headaches.  Psychiatric/Behavioral: Negative for suicidal ideas, sleep disturbance, self-injury and dysphoric mood. The patient is nervous/anxious.     Past Medical History  Diagnosis Date  . Allergic rhinitis   . Hyperlipidemia   . Hypertension   . Lower extremity edema   . Hypothyroid   . Arthritis   . Diabetes mellitus without complication   . Anxiety    Past Surgical History  Procedure Laterality Date  . Cholecystectomy    . Cervical spine surgery      Ruptured disc  . Abdominal hysterectomy  2008  . Oophorectomy  2008  . Joint replacement    . Cesarean section     Allergies  Allergen Reactions  . Codeine Other (See Comments)    incoherent  . Niacin Other (See Comments)    Turns red from chest up. Body hot.  . Pravastatin     Joint pain    Current Outpatient Prescriptions  Medication Sig Dispense Refill  . Blood Glucose Monitoring Suppl KIT Use as directed. Dx: 250.00 1 each 0  . Clobetasol Propionate 0.05 % lotion Apply 1 application topically 2 (two) times daily. 59 mL 5  . Fluocinolone Acetonide 0.01 % OIL Place 5 drops in ear(s) 2 (two) times daily. For 7d as needed 1 Bottle 5  . fluticasone (FLONASE) 50 MCG/ACT nasal spray Place 2 sprays into both nostrils at bedtime. 16 g 2  . Glucose Blood (BLOOD GLUCOSE TEST STRIPS) STRP Check blood sugar twice a week. DX: 250.00 100 each 0  . glucose blood test strip Use as instructed to check cbgs. [facility] Truetest Strips 100 each 12  . Lancets MISC Check blood glucose twice a  week. DX:250.00 100 each 0  . levothyroxine (SYNTHROID, LEVOTHROID) 125 MCG tablet TAKE ONE TABLET BY MOUTH EVERY MORNING BEFORE BREAKFAST. 30 tablet 5  . lisinopril-hydrochlorothiazide (PRINZIDE,ZESTORETIC) 20-25 MG per tablet Take 1 tablet by mouth daily. 90 tablet 1  . Loratadine (CLARITIN PO) Take by mouth.    . metFORMIN (GLUCOPHAGE) 500 MG tablet Take 1 tablet (500 mg total) by mouth daily with breakfast. 90 tablet 1  . nystatin (MYCOSTATIN) powder Apply topically 4 (four) times daily. 60 g 5  . polyethylene glycol powder (GLYCOLAX/MIRALAX) powder Take 17 g by mouth 2 (two) times daily as needed. 527 g 1  . methocarbamol (ROBAXIN) 500 MG tablet Take 1-2 tablets (500-1,000 mg total) by mouth at bedtime as needed for muscle spasms. 45 tablet 2   No current facility-administered medications for this visit.       Objective:    BP 110/80 mmHg  Pulse 81  Temp(Src) 98.2 F (36.8 C) (Oral)  Resp 16  Ht 5' 6.5" (1.689 m)  Wt 236  lb (107.049 kg)  BMI 37.53 kg/m2  SpO2 98% Physical Exam  Constitutional: She is oriented to person, place, and time. She appears well-developed and well-nourished. No distress.  HENT:  Head: Normocephalic and atraumatic.  Right Ear: External ear normal.  Left Ear: External ear normal.  Nose: Nose normal.  Mouth/Throat: Oropharynx is clear and moist.  Eyes: Conjunctivae and EOM are normal. Pupils are equal, round, and reactive to light.  Neck: Normal range of motion. Neck supple. No JVD present. No spinous process tenderness and no muscular tenderness present. Carotid bruit is not present. No rigidity. No tracheal deviation, no edema, no erythema and normal range of motion present. No thyromegaly present.  Cardiovascular: Normal rate, regular rhythm, normal heart sounds and intact distal pulses.  Exam reveals no gallop and no friction rub.   No murmur heard. Pulmonary/Chest: Effort normal and breath sounds normal. No stridor. No respiratory distress. She has  no wheezes. She has no rales.  Abdominal: Soft. Bowel sounds are normal. She exhibits no distension and no mass. There is no tenderness. There is no rebound and no guarding.  Musculoskeletal: Normal range of motion.       Cervical back: Normal. She exhibits normal range of motion, no tenderness, no bony tenderness, no swelling, no edema, no deformity, no pain, no spasm and normal pulse.  Lymphadenopathy:    She has no cervical adenopathy.  Neurological: She is alert and oriented to person, place, and time. No cranial nerve deficit.  Skin: Skin is warm and dry. No rash noted. No erythema. No pallor.  On right lateral torso: hyperpigmented skin lesion about 4 mm in diameter.   Psychiatric: She has a normal mood and affect. Her behavior is normal.  Nursing note and vitals reviewed.  INFLUENZA VACCINE ADMINISTERED.     Assessment & Plan:   1. Flu vaccine need   2. Neck muscle spasm   3. Type 2 diabetes mellitus with diabetic polyneuropathy   4. Other specified hypothyroidism   5. Essential hypertension   6. Other seasonal allergic rhinitis     1.  DMII: controlled; obtain labs; continue current dose of Metformin. 2.  Neck muscle spasm:  New.  S/p cervical spine films at visit in 06/2013; rx for Robaxin provided to take qhs.  Passive range of motion exercises recommended to perform daily. 3.  Hypothyroidism: stable; obtain labs; refill provided. 4.  HTN: controlled; obtain labs; continue current medications. 5.  Allergic Rhinitis: stable.   6.  S/p flu vaccine.     Meds ordered this encounter  Medications  . methocarbamol (ROBAXIN) 500 MG tablet    Sig: Take 1-2 tablets (500-1,000 mg total) by mouth at bedtime as needed for muscle spasms.    Dispense:  45 tablet    Refill:  2  . levothyroxine (SYNTHROID, LEVOTHROID) 125 MCG tablet    Sig: TAKE ONE TABLET BY MOUTH EVERY MORNING BEFORE BREAKFAST.    Dispense:  30 tablet    Refill:  5    Return in about 6 months (around  11/12/2014) for recheck diabetes, high blood pressure, hypothyroidism.    I personally performed the services described in this documentation, which was scribed in my presence. The recorded information has been reviewed and considered.   Allex Lapoint Elayne Guerin, M.D. [facility] [address] [place], [place]  [id number] [phone] phone [phone] fax

## 2014-05-14 LAB — TSH: TSH: 7.511 u[IU]/mL — ABNORMAL HIGH (ref 0.350–4.500)

## 2014-05-14 LAB — CBC WITH DIFFERENTIAL/PLATELET
Basophils Absolute: 0.2 10*3/uL — ABNORMAL HIGH (ref 0.0–0.1)
Basophils Relative: 2 % — ABNORMAL HIGH (ref 0–1)
Eosinophils Absolute: 0.3 10*3/uL (ref 0.0–0.7)
Eosinophils Relative: 3 % (ref 0–5)
HCT: 39.6 % (ref 36.0–46.0)
Hemoglobin: 13.2 g/dL (ref 12.0–15.0)
Lymphocytes Relative: 29 % (ref 12–46)
Lymphs Abs: 2.7 10*3/uL (ref 0.7–4.0)
MCH: 29.3 pg (ref 26.0–34.0)
MCHC: 33.3 g/dL (ref 30.0–36.0)
MCV: 88 fL (ref 78.0–100.0)
MPV: 11.1 fL (ref 9.4–12.4)
Monocytes Absolute: 0.6 10*3/uL (ref 0.1–1.0)
Monocytes Relative: 7 % (ref 3–12)
Neutro Abs: 5.4 10*3/uL (ref 1.7–7.7)
Neutrophils Relative %: 59 % (ref 43–77)
Platelets: 286 10*3/uL (ref 150–400)
RBC: 4.5 MIL/uL (ref 3.87–5.11)
RDW: 15 % (ref 11.5–15.5)
WBC: 9.2 10*3/uL (ref 4.0–10.5)

## 2014-05-14 LAB — COMPREHENSIVE METABOLIC PANEL
ALT: 9 U/L (ref 0–35)
AST: 12 U/L (ref 0–37)
Albumin: 4.4 g/dL (ref 3.5–5.2)
Alkaline Phosphatase: 47 U/L (ref 39–117)
BUN: 19 mg/dL (ref 6–23)
CO2: 29 mEq/L (ref 19–32)
Calcium: 9.5 mg/dL (ref 8.4–10.5)
Chloride: 98 mEq/L (ref 96–112)
Creat: 1.01 mg/dL (ref 0.50–1.10)
Glucose, Bld: 87 mg/dL (ref 70–99)
Potassium: 3.7 mEq/L (ref 3.5–5.3)
Sodium: 138 mEq/L (ref 135–145)
Total Bilirubin: 0.4 mg/dL (ref 0.2–1.2)
Total Protein: 6.8 g/dL (ref 6.0–8.3)

## 2014-05-14 LAB — LIPID PANEL
Cholesterol: 251 mg/dL — ABNORMAL HIGH (ref 0–200)
HDL: 34 mg/dL — ABNORMAL LOW (ref >39)
LDL Cholesterol: 148 mg/dL — ABNORMAL HIGH (ref 0–99)
Total CHOL/HDL Ratio: 7.4 Ratio
Triglycerides: 347 mg/dL — ABNORMAL HIGH (ref <150)
VLDL: 69 mg/dL — ABNORMAL HIGH (ref 0–40)

## 2014-05-14 LAB — MICROALBUMIN, URINE: Microalb, Ur: 1.6 mg/dL (ref <2.0)

## 2014-05-14 LAB — T4, FREE: Free T4: 1.25 ng/dL (ref 0.80–1.80)

## 2014-05-19 ENCOUNTER — Telehealth: Payer: Self-pay | Admitting: *Deleted

## 2014-05-19 NOTE — Telephone Encounter (Signed)
Pt called to inquire about lab results . Looks like they have not been reviewed yet . Please advise .

## 2014-05-24 MED ORDER — LEVOTHYROXINE SODIUM 125 MCG PO TABS: ORAL_TABLET | ORAL | Status: DC

## 2014-05-24 NOTE — Telephone Encounter (Signed)
Lab results sent to lab pool to contact pt with results.

## 2014-05-25 ENCOUNTER — Telehealth: Payer: Self-pay | Admitting: Family Medicine

## 2014-07-01 NOTE — Addendum Note (Signed)
Addended by: Wardell Honour on: 07/01/2014 11:24 AM   Modules accepted: Orders

## 2014-07-08 ENCOUNTER — Ambulatory Visit (HOSPITAL_COMMUNITY)
Admission: RE | Admit: 2014-07-08 | Discharge: 2014-07-08 | Disposition: A | Discharge status: Home / Self Care | Payer: 59 | Source: Ambulatory Visit | Attending: Family Medicine | Admitting: Family Medicine

## 2014-07-08 DIAGNOSIS — I10 Essential (primary) hypertension: Secondary | ICD-10-CM | POA: Diagnosis not present

## 2014-07-08 DIAGNOSIS — R200 Anesthesia of skin: Secondary | ICD-10-CM

## 2014-07-08 DIAGNOSIS — E038 Other specified hypothyroidism: Secondary | ICD-10-CM

## 2014-07-08 DIAGNOSIS — M62838 Other muscle spasm: Secondary | ICD-10-CM

## 2014-07-08 DIAGNOSIS — M6248 Contracture of muscle, other site: Secondary | ICD-10-CM | POA: Diagnosis not present

## 2014-07-08 DIAGNOSIS — E1142 Type 2 diabetes mellitus with diabetic polyneuropathy: Secondary | ICD-10-CM | POA: Insufficient documentation

## 2014-07-08 NOTE — Progress Notes (Signed)
*  PRELIMINARY RESULTS* Vascular Ultrasound Carotid Duplex (Doppler) has been completed.  Preliminary findings: Bilateral:  1-39% ICA stenosis.  Vertebral artery flow is antegrade.      Landry Mellow, RDMS, RVT  07/08/2014, 10:21 AM

## 2014-08-10 ENCOUNTER — Other Ambulatory Visit: Payer: Self-pay | Admitting: Family Medicine

## 2014-08-15 ENCOUNTER — Encounter (HOSPITAL_COMMUNITY): Payer: Self-pay | Admitting: Emergency Medicine

## 2014-08-15 ENCOUNTER — Inpatient Hospital Stay (HOSPITAL_COMMUNITY): Payer: 59

## 2014-08-15 ENCOUNTER — Inpatient Hospital Stay (HOSPITAL_COMMUNITY)
Admission: EM | Admit: 2014-08-15 | Discharge: 2014-08-19 | DRG: 815 | Disposition: A | Discharge status: Home / Self Care | Payer: 59 | Attending: Surgery | Admitting: Surgery

## 2014-08-15 ENCOUNTER — Emergency Department (HOSPITAL_COMMUNITY): Payer: 59

## 2014-08-15 DIAGNOSIS — D62 Acute posthemorrhagic anemia: Secondary | ICD-10-CM | POA: Diagnosis not present

## 2014-08-15 DIAGNOSIS — E039 Hypothyroidism, unspecified: Secondary | ICD-10-CM | POA: Diagnosis present

## 2014-08-15 DIAGNOSIS — S36039A Unspecified laceration of spleen, initial encounter: Secondary | ICD-10-CM | POA: Diagnosis present

## 2014-08-15 DIAGNOSIS — F329 Major depressive disorder, single episode, unspecified: Secondary | ICD-10-CM | POA: Diagnosis present

## 2014-08-15 DIAGNOSIS — Z981 Arthrodesis status: Secondary | ICD-10-CM | POA: Diagnosis not present

## 2014-08-15 DIAGNOSIS — Z79899 Other long term (current) drug therapy: Secondary | ICD-10-CM

## 2014-08-15 DIAGNOSIS — E119 Type 2 diabetes mellitus without complications: Secondary | ICD-10-CM | POA: Diagnosis present

## 2014-08-15 DIAGNOSIS — E785 Hyperlipidemia, unspecified: Secondary | ICD-10-CM | POA: Diagnosis present

## 2014-08-15 DIAGNOSIS — I10 Essential (primary) hypertension: Secondary | ICD-10-CM | POA: Diagnosis present

## 2014-08-15 DIAGNOSIS — S36031A Moderate laceration of spleen, initial encounter: Secondary | ICD-10-CM | POA: Diagnosis present

## 2014-08-15 DIAGNOSIS — M1990 Unspecified osteoarthritis, unspecified site: Secondary | ICD-10-CM | POA: Diagnosis present

## 2014-08-15 DIAGNOSIS — S40012A Contusion of left shoulder, initial encounter: Secondary | ICD-10-CM | POA: Diagnosis present

## 2014-08-15 DIAGNOSIS — F419 Anxiety disorder, unspecified: Secondary | ICD-10-CM | POA: Diagnosis present

## 2014-08-15 DIAGNOSIS — R109 Unspecified abdominal pain: Secondary | ICD-10-CM | POA: Diagnosis present

## 2014-08-15 HISTORY — DX: Unspecified laceration of spleen, initial encounter: S36.039A

## 2014-08-15 LAB — BASIC METABOLIC PANEL
Anion gap: 13 (ref 5–15)
BUN: 16 mg/dL (ref 6–23)
CO2: 23 mmol/L (ref 19–32)
Calcium: 8.9 mg/dL (ref 8.4–10.5)
Chloride: 102 mmol/L (ref 96–112)
Creatinine, Ser: 1.07 mg/dL (ref 0.50–1.10)
GFR calc Af Amer: 67 mL/min — ABNORMAL LOW (ref >90)
GFR calc non Af Amer: 58 mL/min — ABNORMAL LOW (ref >90)
Glucose, Bld: 139 mg/dL — ABNORMAL HIGH (ref 70–99)
Potassium: 3.4 mmol/L — ABNORMAL LOW (ref 3.5–5.1)
Sodium: 138 mmol/L (ref 135–145)

## 2014-08-15 LAB — PROTIME-INR
INR: 0.97 (ref 0.00–1.49)
Prothrombin Time: 13 seconds (ref 11.6–15.2)

## 2014-08-15 LAB — CBC
HCT: 37.9 % (ref 36.0–46.0)
Hemoglobin: 12.7 g/dL (ref 12.0–15.0)
MCH: 29.9 pg (ref 26.0–34.0)
MCHC: 33.5 g/dL (ref 30.0–36.0)
MCV: 89.2 fL (ref 78.0–100.0)
Platelets: 277 10*3/uL (ref 150–400)
RBC: 4.25 MIL/uL (ref 3.87–5.11)
RDW: 14.2 % (ref 11.5–15.5)
WBC: 10.6 10*3/uL — ABNORMAL HIGH (ref 4.0–10.5)

## 2014-08-15 LAB — MRSA PCR SCREENING: MRSA by PCR: NEGATIVE

## 2014-08-15 LAB — HEMOGLOBIN AND HEMATOCRIT, BLOOD
HCT: 35 % — ABNORMAL LOW (ref 36.0–46.0)
Hemoglobin: 11.7 g/dL — ABNORMAL LOW (ref 12.0–15.0)

## 2014-08-15 MED ORDER — IOHEXOL 300 MG/ML  SOLN
100.0000 mL | Freq: Once | INTRAMUSCULAR | Status: AC | PRN
  Administered 2014-08-15: 100 mL via INTRAVENOUS

## 2014-08-15 MED ORDER — MORPHINE SULFATE 4 MG/ML IJ SOLN
6.0000 mg | Freq: Once | INTRAMUSCULAR | Status: AC
  Administered 2014-08-15: 6 mg via INTRAVENOUS
  Filled 2014-08-15: qty 2

## 2014-08-15 MED ORDER — PANTOPRAZOLE SODIUM 40 MG IV SOLR
40.0000 mg | Freq: Every day | INTRAVENOUS | Status: DC
  Administered 2014-08-15 – 2014-08-16 (×2): 40 mg via INTRAVENOUS
  Filled 2014-08-15 (×3): qty 40

## 2014-08-15 MED ORDER — ACETAMINOPHEN 325 MG PO TABS
650.0000 mg | ORAL_TABLET | Freq: Four times a day (QID) | ORAL | Status: DC | PRN
  Administered 2014-08-16 – 2014-08-18 (×4): 650 mg via ORAL
  Filled 2014-08-15 (×4): qty 2

## 2014-08-15 MED ORDER — HYDROMORPHONE HCL 1 MG/ML IJ SOLN
1.0000 mg | INTRAMUSCULAR | Status: DC | PRN
  Administered 2014-08-15 – 2014-08-16 (×4): 1 mg via INTRAVENOUS
  Filled 2014-08-15 (×4): qty 1

## 2014-08-15 MED ORDER — ACETAMINOPHEN 650 MG RE SUPP: 650.0000 mg | Freq: Four times a day (QID) | RECTAL | Status: DC | PRN

## 2014-08-15 MED ORDER — CETYLPYRIDINIUM CHLORIDE 0.05 % MT LIQD
7.0000 mL | Freq: Two times a day (BID) | OROMUCOSAL | Status: DC
  Administered 2014-08-15: 7 mL via OROMUCOSAL

## 2014-08-15 MED ORDER — ONDANSETRON HCL 4 MG/2ML IJ SOLN
4.0000 mg | Freq: Four times a day (QID) | INTRAMUSCULAR | Status: DC | PRN
  Administered 2014-08-15 – 2014-08-18 (×3): 4 mg via INTRAVENOUS
  Filled 2014-08-15 (×3): qty 2

## 2014-08-15 MED ORDER — KCL IN DEXTROSE-NACL 20-5-0.45 MEQ/L-%-% IV SOLN
INTRAVENOUS | Status: DC
  Administered 2014-08-15 – 2014-08-17 (×3): via INTRAVENOUS
  Filled 2014-08-15 (×7): qty 1000

## 2014-08-15 MED ORDER — DOCUSATE SODIUM 100 MG PO CAPS
100.0000 mg | ORAL_CAPSULE | Freq: Two times a day (BID) | ORAL | Status: DC
  Administered 2014-08-16 – 2014-08-17 (×3): 100 mg via ORAL
  Filled 2014-08-15 (×6): qty 1

## 2014-08-15 MED ORDER — OXYCODONE HCL 5 MG PO TABS: 5.0000 mg | ORAL_TABLET | ORAL | Status: DC | PRN

## 2014-08-15 MED ORDER — SODIUM CHLORIDE 0.9 % IV SOLN: INTRAVENOUS | Status: DC

## 2014-08-15 NOTE — ED Notes (Signed)
Patient transported to CT 
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
## 2014-08-15 NOTE — ED Notes (Signed)
MD Gerkin at bedside to explain plan of care to family and patient.

## 2014-08-15 NOTE — Progress Notes (Signed)
Patient ID: Connie [name], female   DOB: 06/13/1960, 54 y.o.   MRN: [id number]   CT cervical spine negative for any acute injury.  Because of the patient's tenderness in the lower cervical spine, we will leave her in an [place] collar overnight and try to clear her clinically tomorrow, possibly with flexion/ extension views.  Imogene Burn. Georgette Dover, MD, [facility]  General/ Trauma Surgery  08/15/2014 6:56 PM

## 2014-08-15 NOTE — ED Notes (Signed)
Pt currently in exray
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
## 2014-08-15 NOTE — ED Notes (Signed)
Patient transported to X-ray 
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
## 2014-08-16 LAB — GLUCOSE, CAPILLARY
Glucose-Capillary: 120 mg/dL — ABNORMAL HIGH (ref 70–99)
Glucose-Capillary: 106 mg/dL — ABNORMAL HIGH (ref 70–99)
Glucose-Capillary: 110 mg/dL — ABNORMAL HIGH (ref 70–99)

## 2014-08-16 LAB — HEMOGLOBIN AND HEMATOCRIT, BLOOD
HCT: 32.6 % — ABNORMAL LOW (ref 36.0–46.0)
HCT: 34 % — ABNORMAL LOW (ref 36.0–46.0)
Hemoglobin: 10.8 g/dL — ABNORMAL LOW (ref 12.0–15.0)
Hemoglobin: 11.3 g/dL — ABNORMAL LOW (ref 12.0–15.0)

## 2014-08-16 MED ORDER — OXYCODONE-ACETAMINOPHEN 5-325 MG PO TABS
1.0000 | ORAL_TABLET | ORAL | Status: DC | PRN
  Administered 2014-08-16 – 2014-08-17 (×2): 2 via ORAL
  Filled 2014-08-16 (×2): qty 2

## 2014-08-16 MED ORDER — HYDROMORPHONE HCL 1 MG/ML IJ SOLN
1.0000 mg | INTRAMUSCULAR | Status: DC | PRN
  Administered 2014-08-16: 1 mg via INTRAVENOUS
  Filled 2014-08-16: qty 1
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
## 2014-08-16 NOTE — Progress Notes (Signed)
Patient transferred with belongings, medications, and chart to room 6 [address] 19 with nurse. New nurse at bedside. No new problems at this time.  Connie [name]

## 2014-08-17 DIAGNOSIS — D62 Acute posthemorrhagic anemia: Secondary | ICD-10-CM | POA: Diagnosis not present

## 2014-08-17 LAB — CBC
HCT: 31 % — ABNORMAL LOW (ref 36.0–46.0)
Hemoglobin: 10.1 g/dL — ABNORMAL LOW (ref 12.0–15.0)
MCH: 29.5 pg (ref 26.0–34.0)
MCHC: 32.6 g/dL (ref 30.0–36.0)
MCV: 90.6 fL (ref 78.0–100.0)
Platelets: 213 10*3/uL (ref 150–400)
RBC: 3.42 MIL/uL — ABNORMAL LOW (ref 3.87–5.11)
RDW: 14.8 % (ref 11.5–15.5)
WBC: 8 10*3/uL (ref 4.0–10.5)

## 2014-08-17 LAB — GLUCOSE, CAPILLARY
Glucose-Capillary: 105 mg/dL — ABNORMAL HIGH (ref 70–99)
Glucose-Capillary: 86 mg/dL (ref 70–99)
Glucose-Capillary: 126 mg/dL — ABNORMAL HIGH (ref 70–99)
Glucose-Capillary: 103 mg/dL — ABNORMAL HIGH (ref 70–99)

## 2014-08-17 MED ORDER — HYDROMORPHONE HCL 1 MG/ML IJ SOLN: 0.5000 mg | INTRAMUSCULAR | Status: DC | PRN

## 2014-08-17 MED ORDER — FLUTICASONE PROPIONATE 50 MCG/ACT NA SUSP
2.0000 | Freq: Every day | NASAL | Status: DC
  Administered 2014-08-18: 2 via NASAL
  Filled 2014-08-17 (×2): qty 16

## 2014-08-17 MED ORDER — LISINOPRIL 20 MG PO TABS
20.0000 mg | ORAL_TABLET | Freq: Every day | ORAL | Status: DC
  Administered 2014-08-17 – 2014-08-19 (×3): 20 mg via ORAL
  Filled 2014-08-17 (×3): qty 1

## 2014-08-17 MED ORDER — LISINOPRIL-HYDROCHLOROTHIAZIDE 20-25 MG PO TABS: 1.0000 | ORAL_TABLET | Freq: Every day | ORAL | Status: DC

## 2014-08-17 MED ORDER — LORATADINE 10 MG PO TABS
10.0000 mg | ORAL_TABLET | Freq: Every day | ORAL | Status: DC
  Administered 2014-08-17 – 2014-08-19 (×3): 10 mg via ORAL
  Filled 2014-08-17 (×3): qty 1

## 2014-08-17 MED ORDER — LEVOTHYROXINE SODIUM 25 MCG PO TABS
137.0000 ug | ORAL_TABLET | Freq: Every day | ORAL | Status: DC
  Administered 2014-08-17 – 2014-08-19 (×3): 137 ug via ORAL
  Filled 2014-08-17 (×7): qty 1

## 2014-08-17 MED ORDER — HYDROCHLOROTHIAZIDE 25 MG PO TABS
25.0000 mg | ORAL_TABLET | Freq: Every day | ORAL | Status: DC
  Administered 2014-08-17 – 2014-08-19 (×3): 25 mg via ORAL
  Filled 2014-08-17 (×3): qty 1

## 2014-08-17 MED ORDER — METFORMIN HCL 500 MG PO TABS
500.0000 mg | ORAL_TABLET | Freq: Every day | ORAL | Status: DC
  Administered 2014-08-17 – 2014-08-19 (×3): 500 mg via ORAL
  Filled 2014-08-17 (×3): qty 1

## 2014-08-17 MED ORDER — OXYCODONE HCL 5 MG PO TABS
5.0000 mg | ORAL_TABLET | ORAL | Status: DC | PRN
  Administered 2014-08-17: 15 mg via ORAL
  Administered 2014-08-17: 10 mg via ORAL
  Administered 2014-08-18 (×2): 15 mg via ORAL
  Administered 2014-08-19 (×2): 10 mg via ORAL
  Filled 2014-08-17: qty 3
  Filled 2014-08-17 (×2): qty 2
  Filled 2014-08-17 (×2): qty 3
  Filled 2014-08-17: qty 2

## 2014-08-17 MED ORDER — METHOCARBAMOL 500 MG PO TABS: 500.0000 mg | ORAL_TABLET | Freq: Every evening | ORAL | Status: DC | PRN

## 2014-08-17 MED ORDER — POLYETHYLENE GLYCOL 3350 17 G PO PACK
17.0000 g | PACK | Freq: Every day | ORAL | Status: DC
  Administered 2014-08-17 – 2014-08-19 (×3): 17 g via ORAL
  Filled 2014-08-17 (×3): qty 1

## 2014-08-17 NOTE — Progress Notes (Signed)
UR completed.  Brittan Butterbaugh, RN BSN MHA CCM Trauma/Neuro ICU Case Manager [phone]  
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
## 2014-08-18 LAB — CBC
HCT: 29.8 % — ABNORMAL LOW (ref 36.0–46.0)
Hemoglobin: 9.7 g/dL — ABNORMAL LOW (ref 12.0–15.0)
MCH: 29.7 pg (ref 26.0–34.0)
MCHC: 32.6 g/dL (ref 30.0–36.0)
MCV: 91.1 fL (ref 78.0–100.0)
Platelets: 219 10*3/uL (ref 150–400)
RBC: 3.27 MIL/uL — ABNORMAL LOW (ref 3.87–5.11)
RDW: 14.9 % (ref 11.5–15.5)
WBC: 8.6 10*3/uL (ref 4.0–10.5)

## 2014-08-18 LAB — GLUCOSE, CAPILLARY
Glucose-Capillary: 90 mg/dL (ref 70–99)
Glucose-Capillary: 109 mg/dL — ABNORMAL HIGH (ref 70–99)
Glucose-Capillary: 97 mg/dL (ref 70–99)
Glucose-Capillary: 96 mg/dL (ref 70–99)

## 2014-08-18 MED ORDER — DOCUSATE SODIUM 100 MG PO CAPS
200.0000 mg | ORAL_CAPSULE | Freq: Two times a day (BID) | ORAL | Status: DC
  Administered 2014-08-18 – 2014-08-19 (×3): 200 mg via ORAL
  Filled 2014-08-18 (×3): qty 2
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
## 2014-08-18 NOTE — Progress Notes (Signed)
Pt. received Synthroid and Metformin at 7:37 am. Pt. experienced N/V following medication administration. Pt. vomited about 50 mL of clear emesis with small white particles. PRN Zofran was administered. Pt. feels N/V was due to severe headache (feels like a sinus headache per patient). Reported pain an 8 out of 10, will receive PRN Tylenol with 10 am medications after eating breakfast so she will have food on her stomach and prevent further N/V. Offered stronger pain relief medication, but patient states she does not want to feel drowsy.  Pt. up in chair and comfortable with relief of N/V.

## 2014-08-19 LAB — CBC
HCT: 32.1 % — ABNORMAL LOW (ref 36.0–46.0)
HCT: 32.8 % — ABNORMAL LOW (ref 36.0–46.0)
Hemoglobin: 10.2 g/dL — ABNORMAL LOW (ref 12.0–15.0)
Hemoglobin: 10.5 g/dL — ABNORMAL LOW (ref 12.0–15.0)
MCH: 29 pg (ref 26.0–34.0)
MCH: 29.3 pg (ref 26.0–34.0)
MCHC: 31.8 g/dL (ref 30.0–36.0)
MCHC: 32 g/dL (ref 30.0–36.0)
MCV: 91.2 fL (ref 78.0–100.0)
MCV: 91.6 fL (ref 78.0–100.0)
Platelets: 237 10*3/uL (ref 150–400)
Platelets: 240 10*3/uL (ref 150–400)
RBC: 3.52 MIL/uL — ABNORMAL LOW (ref 3.87–5.11)
RBC: 3.58 MIL/uL — ABNORMAL LOW (ref 3.87–5.11)
RDW: 15 % (ref 11.5–15.5)
RDW: 14.9 % (ref 11.5–15.5)
WBC: 9.1 10*3/uL (ref 4.0–10.5)
WBC: 11.4 10*3/uL — ABNORMAL HIGH (ref 4.0–10.5)

## 2014-08-19 LAB — GLUCOSE, CAPILLARY
Glucose-Capillary: 84 mg/dL (ref 70–99)
Glucose-Capillary: 127 mg/dL — ABNORMAL HIGH (ref 70–99)

## 2014-08-19 MED ORDER — OXYCODONE-ACETAMINOPHEN 5-325 MG PO TABS: 1.0000 | ORAL_TABLET | ORAL | Status: DC | PRN

## 2014-08-19 NOTE — Clinical Social Work Note (Signed)
Patient to discharge home with family today.  Clinical Social Worker will sign off for now as social work intervention is no longer needed. Please consult [place] again if new need arises.  Barbette Or, [place]

## 2014-08-19 NOTE — Progress Notes (Signed)
Connie [name] to be D/C'd Home per MD order.  Discussed with the patient and all questions fully answered.  VSS, Skin clean, dry and intact without evidence of skin break down, no evidence of skin tears noted. IV catheter discontinued intact. Site without signs and symptoms of complications. Dressing and pressure applied.  An After Visit Summary was printed and given to the patient. Patient received prescription.  D/c education completed with patient/family including follow up instructions, medication list, d/c activities limitations if indicated, with other d/c instructions as indicated by MD - patient able to verbalize understanding, all questions fully answered.   Patient instructed to return to ED, call 911, or call MD for any changes in condition.   Patient escorted via [place], and D/C home via private auto.  Micki Riley 08/19/2014 3:43 PM

## 2014-08-19 NOTE — Discharge Instructions (Signed)
No running, jumping, ball or contact sports, bikes, skateboards, motorcycles, etc for 3 months.  No driving while taking oxycodone.
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
## 2014-08-22 ENCOUNTER — Emergency Department (HOSPITAL_COMMUNITY): Payer: 59

## 2014-08-22 ENCOUNTER — Encounter (HOSPITAL_COMMUNITY): Payer: Self-pay | Admitting: Family Medicine

## 2014-08-22 ENCOUNTER — Emergency Department (HOSPITAL_COMMUNITY)
Admission: EM | Admit: 2014-08-22 | Discharge: 2014-08-22 | Disposition: A | Discharge status: Home / Self Care | Payer: 59 | Attending: Emergency Medicine | Admitting: Emergency Medicine

## 2014-08-22 DIAGNOSIS — Z8709 Personal history of other diseases of the respiratory system: Secondary | ICD-10-CM | POA: Diagnosis not present

## 2014-08-22 DIAGNOSIS — R110 Nausea: Secondary | ICD-10-CM | POA: Insufficient documentation

## 2014-08-22 DIAGNOSIS — Z8659 Personal history of other mental and behavioral disorders: Secondary | ICD-10-CM | POA: Diagnosis not present

## 2014-08-22 DIAGNOSIS — S36039D Unspecified laceration of spleen, subsequent encounter: Secondary | ICD-10-CM | POA: Diagnosis not present

## 2014-08-22 DIAGNOSIS — M1990 Unspecified osteoarthritis, unspecified site: Secondary | ICD-10-CM | POA: Insufficient documentation

## 2014-08-22 DIAGNOSIS — E119 Type 2 diabetes mellitus without complications: Secondary | ICD-10-CM | POA: Insufficient documentation

## 2014-08-22 DIAGNOSIS — M549 Dorsalgia, unspecified: Secondary | ICD-10-CM | POA: Insufficient documentation

## 2014-08-22 DIAGNOSIS — E039 Hypothyroidism, unspecified: Secondary | ICD-10-CM | POA: Insufficient documentation

## 2014-08-22 DIAGNOSIS — Z7952 Long term (current) use of systemic steroids: Secondary | ICD-10-CM | POA: Insufficient documentation

## 2014-08-22 DIAGNOSIS — R079 Chest pain, unspecified: Secondary | ICD-10-CM | POA: Diagnosis not present

## 2014-08-22 DIAGNOSIS — M25512 Pain in left shoulder: Secondary | ICD-10-CM | POA: Insufficient documentation

## 2014-08-22 DIAGNOSIS — R109 Unspecified abdominal pain: Secondary | ICD-10-CM

## 2014-08-22 DIAGNOSIS — G8911 Acute pain due to trauma: Secondary | ICD-10-CM | POA: Insufficient documentation

## 2014-08-22 DIAGNOSIS — Z79899 Other long term (current) drug therapy: Secondary | ICD-10-CM | POA: Insufficient documentation

## 2014-08-22 DIAGNOSIS — I10 Essential (primary) hypertension: Secondary | ICD-10-CM | POA: Diagnosis not present

## 2014-08-22 LAB — CBC WITH DIFFERENTIAL/PLATELET
Basophils Absolute: 0.1 10*3/uL (ref 0.0–0.1)
Basophils Relative: 1 % (ref 0–1)
Eosinophils Absolute: 0.1 10*3/uL (ref 0.0–0.7)
Eosinophils Relative: 0 % (ref 0–5)
HCT: 33.4 % — ABNORMAL LOW (ref 36.0–46.0)
Hemoglobin: 10.9 g/dL — ABNORMAL LOW (ref 12.0–15.0)
Lymphocytes Relative: 9 % — ABNORMAL LOW (ref 12–46)
Lymphs Abs: 1.1 10*3/uL (ref 0.7–4.0)
MCH: 29.3 pg (ref 26.0–34.0)
MCHC: 32.6 g/dL (ref 30.0–36.0)
MCV: 89.8 fL (ref 78.0–100.0)
Monocytes Absolute: 1 10*3/uL (ref 0.1–1.0)
Monocytes Relative: 8 % (ref 3–12)
Neutro Abs: 10.2 10*3/uL — ABNORMAL HIGH (ref 1.7–7.7)
Neutrophils Relative %: 82 % — ABNORMAL HIGH (ref 43–77)
Platelets: 318 10*3/uL (ref 150–400)
RBC: 3.72 MIL/uL — ABNORMAL LOW (ref 3.87–5.11)
RDW: 14.9 % (ref 11.5–15.5)
WBC: 12.3 10*3/uL — ABNORMAL HIGH (ref 4.0–10.5)

## 2014-08-22 LAB — URINALYSIS, ROUTINE W REFLEX MICROSCOPIC
Bilirubin Urine: NEGATIVE
Glucose, UA: NEGATIVE mg/dL
Hgb urine dipstick: NEGATIVE
Ketones, ur: NEGATIVE mg/dL
Leukocytes, UA: NEGATIVE
Nitrite: NEGATIVE
Protein, ur: NEGATIVE mg/dL
Specific Gravity, Urine: 1.018 (ref 1.005–1.030)
Urobilinogen, UA: 1 mg/dL (ref 0.0–1.0)
pH: 6.5 (ref 5.0–8.0)

## 2014-08-22 LAB — I-STAT CHEM 8, ED
BUN: 17 mg/dL (ref 6–23)
Calcium, Ion: 1.05 mmol/L — ABNORMAL LOW (ref 1.12–1.23)
Chloride: 95 mmol/L — ABNORMAL LOW (ref 96–112)
Creatinine, Ser: 1.1 mg/dL (ref 0.50–1.10)
Glucose, Bld: 121 mg/dL — ABNORMAL HIGH (ref 70–99)
HCT: 36 % (ref 36.0–46.0)
Hemoglobin: 12.2 g/dL (ref 12.0–15.0)
Potassium: 3.9 mmol/L (ref 3.5–5.1)
Sodium: 131 mmol/L — ABNORMAL LOW (ref 135–145)
TCO2: 24 mmol/L (ref 0–100)

## 2014-08-22 LAB — I-STAT TROPONIN, ED: Troponin i, poc: 0 ng/mL (ref 0.00–0.08)

## 2014-08-22 MED ORDER — IOHEXOL 300 MG/ML  SOLN
100.0000 mL | Freq: Once | INTRAMUSCULAR | Status: AC | PRN
Start: 2014-08-22 — End: 2014-08-22
  Administered 2014-08-22: 100 mL via INTRAVENOUS

## 2014-08-22 MED ORDER — IOHEXOL 300 MG/ML  SOLN
50.0000 mL | Freq: Once | INTRAMUSCULAR | Status: AC | PRN
  Administered 2014-08-22: 50 mL via INTRAVENOUS

## 2014-08-22 MED ORDER — MORPHINE SULFATE 4 MG/ML IJ SOLN
4.0000 mg | Freq: Once | INTRAMUSCULAR | Status: AC
  Administered 2014-08-22: 4 mg via INTRAVENOUS
  Filled 2014-08-22: qty 1

## 2014-08-22 MED ORDER — MORPHINE SULFATE 4 MG/ML IJ SOLN
4.0000 mg | Freq: Once | INTRAMUSCULAR | Status: AC
Start: 2014-08-22 — End: 2014-08-22
  Administered 2014-08-22: 4 mg via INTRAVENOUS
  Filled 2014-08-22: qty 1

## 2014-08-22 MED ORDER — ONDANSETRON HCL 4 MG/2ML IJ SOLN
4.0000 mg | Freq: Once | INTRAMUSCULAR | Status: AC
  Administered 2014-08-22: 4 mg via INTRAVENOUS
  Filled 2014-08-22: qty 2

## 2014-08-22 NOTE — ED Notes (Signed)
NAD at this time. Pt is stable and going home.  
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
## 2014-08-22 NOTE — Discharge Instructions (Signed)
Abdominal Pain Your blood count is stable. Your spleen laceration appears to be stable. Follow up with the surgeons as scheduled. Return to the ED with worsening pain, dizziness, weakness, any other concerns. Encouraged yourself to take deep breaths and take your pain medication as prescribed. Many things can cause abdominal pain. Usually, abdominal pain is not caused by a disease and will improve without treatment. It can often be observed and treated at home. Your health care provider will do a physical exam and possibly order blood tests and X-rays to help determine the seriousness of your pain. However, in many cases, more time must pass before a clear cause of the pain can be found. Before that point, your health care provider may not know if you need more testing or further treatment. HOME CARE INSTRUCTIONS  Monitor your abdominal pain for any changes. The following actions may help to alleviate any discomfort you are experiencing:  Only take over-the-counter or prescription medicines as directed by your health care provider.  Do not take laxatives unless directed to do so by your health care provider.  Try a clear liquid diet (broth, tea, or water) as directed by your health care provider. Slowly move to a bland diet as tolerated. SEEK MEDICAL CARE IF:  You have unexplained abdominal pain.  You have abdominal pain associated with nausea or diarrhea.  You have pain when you urinate or have a bowel movement.  You experience abdominal pain that wakes you in the night.  You have abdominal pain that is worsened or improved by eating food.  You have abdominal pain that is worsened with eating fatty foods.  You have a fever. SEEK IMMEDIATE MEDICAL CARE IF:   Your pain does not go away within 2 hours.  You keep throwing up (vomiting).  Your pain is felt only in portions of the abdomen, such as the right side or the left lower portion of the abdomen.  You pass bloody or black tarry  stools. MAKE SURE YOU:  Understand these instructions.   Will watch your condition.   Will get help right away if you are not doing well or get worse.  Document Released: 02/21/2005 Document Revised: 05/19/2013 Document Reviewed: 01/21/2013 [facility] Patient Information 2015 [place], [place]. This information is not intended to replace advice given to you by your health care provider. Make sure you discuss any questions you have with your health care provider.

## 2014-08-22 NOTE — ED Notes (Signed)
Patient transported to CT 

## 2014-08-22 NOTE — ED Notes (Signed)
Notified CT, patient done with second cup of contrast.

## 2014-08-22 NOTE — ED Notes (Signed)
Patient returned from CT
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
## 2014-08-22 NOTE — ED Notes (Signed)
Notified CT patient done with cup of contrast.

## 2014-08-23 ENCOUNTER — Telehealth (HOSPITAL_COMMUNITY): Payer: Self-pay

## 2014-08-23 NOTE — Telephone Encounter (Signed)
Scheduled f/u appt for this Wednesday

## 2014-08-24 ENCOUNTER — Telehealth (HOSPITAL_COMMUNITY): Payer: Self-pay

## 2014-08-26 LAB — HM DIABETES EYE EXAM

## 2014-09-05 ENCOUNTER — Ambulatory Visit (INDEPENDENT_AMBULATORY_CARE_PROVIDER_SITE_OTHER): Payer: 59

## 2014-09-05 ENCOUNTER — Telehealth: Payer: Self-pay | Admitting: *Deleted

## 2014-09-05 ENCOUNTER — Ambulatory Visit (INDEPENDENT_AMBULATORY_CARE_PROVIDER_SITE_OTHER): Payer: 59 | Admitting: Family Medicine

## 2014-09-05 ENCOUNTER — Ambulatory Visit (HOSPITAL_COMMUNITY)
Admission: AD | Admit: 2014-09-05 | Discharge: 2014-09-05 | Disposition: A | Discharge status: Home / Self Care | Payer: 59 | Source: Ambulatory Visit | Attending: Family Medicine | Admitting: Family Medicine

## 2014-09-05 ENCOUNTER — Ambulatory Visit (HOSPITAL_COMMUNITY)
Admission: RE | Admit: 2014-09-05 | Discharge: 2014-09-05 | Disposition: A | Discharge status: Home / Self Care | Payer: 59 | Source: Ambulatory Visit | Attending: Family Medicine | Admitting: Family Medicine

## 2014-09-05 VITALS — BP 120/79 | HR 91 | Temp 98.4°F | Resp 17 | Ht 67.0 in | Wt 229.0 lb

## 2014-09-05 DIAGNOSIS — I3139 Other pericardial effusion (noninflammatory): Secondary | ICD-10-CM

## 2014-09-05 DIAGNOSIS — S36039A Unspecified laceration of spleen, initial encounter: Secondary | ICD-10-CM | POA: Diagnosis not present

## 2014-09-05 DIAGNOSIS — I313 Pericardial effusion (noninflammatory): Secondary | ICD-10-CM

## 2014-09-05 DIAGNOSIS — K661 Hemoperitoneum: Secondary | ICD-10-CM | POA: Insufficient documentation

## 2014-09-05 DIAGNOSIS — R110 Nausea: Secondary | ICD-10-CM

## 2014-09-05 DIAGNOSIS — R1084 Generalized abdominal pain: Secondary | ICD-10-CM | POA: Diagnosis not present

## 2014-09-05 DIAGNOSIS — I319 Disease of pericardium, unspecified: Secondary | ICD-10-CM

## 2014-09-05 DIAGNOSIS — R109 Unspecified abdominal pain: Secondary | ICD-10-CM

## 2014-09-05 DIAGNOSIS — J90 Pleural effusion, not elsewhere classified: Secondary | ICD-10-CM

## 2014-09-05 DIAGNOSIS — R0601 Orthopnea: Secondary | ICD-10-CM

## 2014-09-05 DIAGNOSIS — M25512 Pain in left shoulder: Secondary | ICD-10-CM

## 2014-09-05 DIAGNOSIS — R9431 Abnormal electrocardiogram [ECG] [EKG]: Secondary | ICD-10-CM | POA: Diagnosis not present

## 2014-09-05 DIAGNOSIS — E118 Type 2 diabetes mellitus with unspecified complications: Secondary | ICD-10-CM | POA: Diagnosis not present

## 2014-09-05 DIAGNOSIS — R000 Tachycardia, unspecified: Secondary | ICD-10-CM | POA: Diagnosis not present

## 2014-09-05 LAB — POCT CBC
Granulocyte percent: 76.6 %G (ref 37–80)
HCT, POC: 33.8 % — AB (ref 37.7–47.9)
Hemoglobin: 11 g/dL — AB (ref 12.2–16.2)
Lymph, poc: 1.5 (ref 0.6–3.4)
MCH, POC: 27.9 pg (ref 27–31.2)
MCHC: 32.6 g/dL (ref 31.8–35.4)
MCV: 85.8 fL (ref 80–97)
MID (cbc): 0.8 (ref 0–0.9)
MPV: 7.4 fL (ref 0–99.8)
POC Granulocyte: 7.7 — AB (ref 2–6.9)
POC LYMPH PERCENT: 15 %L (ref 10–50)
POC MID %: 8.4 %M (ref 0–12)
Platelet Count, POC: 554 10*3/uL — AB (ref 142–424)
RBC: 3.95 M/uL — AB (ref 4.04–5.48)
RDW, POC: 15.5 %
WBC: 10 10*3/uL (ref 4.6–10.2)

## 2014-09-05 LAB — POCT UA - MICROSCOPIC ONLY
Casts, Ur, LPF, POC: NEGATIVE
Crystals, Ur, HPF, POC: NEGATIVE
Yeast, UA: NEGATIVE

## 2014-09-05 LAB — POCT URINALYSIS DIPSTICK
Bilirubin, UA: NEGATIVE
Blood, UA: NEGATIVE
Glucose, UA: NEGATIVE
Ketones, UA: NEGATIVE
Nitrite, UA: NEGATIVE
Spec Grav, UA: 1.02
Urobilinogen, UA: 0.2
pH, UA: 5.5

## 2014-09-05 LAB — COMPREHENSIVE METABOLIC PANEL
ALT: 8 U/L (ref 0–35)
AST: 10 U/L (ref 0–37)
Albumin: 4.1 g/dL (ref 3.5–5.2)
Alkaline Phosphatase: 82 U/L (ref 39–117)
BUN: 29 mg/dL — ABNORMAL HIGH (ref 6–23)
CO2: 28 mEq/L (ref 19–32)
Calcium: 9.4 mg/dL (ref 8.4–10.5)
Chloride: 96 mEq/L (ref 96–112)
Creat: 1.54 mg/dL — ABNORMAL HIGH (ref 0.50–1.10)
Glucose, Bld: 97 mg/dL (ref 70–99)
Potassium: 4.9 mEq/L (ref 3.5–5.3)
Sodium: 136 mEq/L (ref 135–145)
Total Bilirubin: 0.6 mg/dL (ref 0.2–1.2)
Total Protein: 7 g/dL (ref 6.0–8.3)

## 2014-09-05 LAB — GLUCOSE, POCT (MANUAL RESULT ENTRY): POC Glucose: 103 mg/dl — AB (ref 70–99)

## 2014-09-05 MED ORDER — HYDROCODONE-ACETAMINOPHEN 5-325 MG PO TABS: 1.0000 | ORAL_TABLET | ORAL | Status: DC | PRN

## 2014-09-05 MED ORDER — IOHEXOL 300 MG/ML  SOLN
100.0000 mL | Freq: Once | INTRAMUSCULAR | Status: AC | PRN
  Administered 2014-09-05: 100 mL via INTRAVENOUS

## 2014-09-05 MED ORDER — ONDANSETRON 8 MG PO TBDP: 8.0000 mg | ORAL_TABLET | Freq: Three times a day (TID) | ORAL | Status: DC | PRN

## 2014-09-05 MED ORDER — ONDANSETRON 4 MG PO TBDP
8.0000 mg | ORAL_TABLET | Freq: Once | ORAL | Status: AC
Start: 2014-09-05 — End: 2014-09-05
  Administered 2014-09-05: 8 mg via ORAL

## 2014-09-05 NOTE — Telephone Encounter (Signed)
Pt was called and told to report to [facility] and to register as an outpatient for her scheduled CT.  Pt understood
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
## 2014-09-06 ENCOUNTER — Telehealth: Payer: Self-pay

## 2014-09-06 MED ORDER — GLUCOSE BLOOD VI STRP: ORAL_STRIP | Status: AC

## 2014-09-06 NOTE — Telephone Encounter (Signed)
Spoke with pt--pt's biggest concern is the fluid. She wants to make sure the fluid is going away, and that there isn't as much concern as there was before. Pt was crying on the phone. Please advise.

## 2014-09-06 NOTE — Telephone Encounter (Signed)
Pt went and had an ultra sound done and would like to speak with Dr.Shaw about it. Please call [phone]

## 2014-09-06 NOTE — Telephone Encounter (Signed)
Called pt and provided reassurence - as I had LVM yest for her and her mother stating that CT scans show everything is improving.  Need to cont rest, prn hydrocodone, start some short walks for exercise, push fluids, check cbgs bid, no metformin for now, rehceck in OV next week - if she would like to sched at the [facility] app[facility] on Friday that is fine to [place]

## 2014-09-07 LAB — PRO B NATRIURETIC PEPTIDE: Pro B Natriuretic peptide (BNP): 32.34 pg/mL (ref <126)

## 2014-09-08 ENCOUNTER — Encounter: Payer: Self-pay | Admitting: Family Medicine

## 2014-09-08 DIAGNOSIS — K760 Fatty (change of) liver, not elsewhere classified: Secondary | ICD-10-CM

## 2014-09-08 HISTORY — DX: Fatty (change of) liver, not elsewhere classified: K76.0

## 2014-09-15 ENCOUNTER — Ambulatory Visit (INDEPENDENT_AMBULATORY_CARE_PROVIDER_SITE_OTHER): Payer: 59 | Admitting: Family Medicine

## 2014-09-15 ENCOUNTER — Ambulatory Visit (INDEPENDENT_AMBULATORY_CARE_PROVIDER_SITE_OTHER): Payer: 59

## 2014-09-15 VITALS — BP 110/78 | HR 95 | Temp 98.4°F | Resp 16 | Ht 67.0 in | Wt 231.4 lb

## 2014-09-15 DIAGNOSIS — J3489 Other specified disorders of nose and nasal sinuses: Secondary | ICD-10-CM | POA: Diagnosis not present

## 2014-09-15 DIAGNOSIS — N898 Other specified noninflammatory disorders of vagina: Secondary | ICD-10-CM

## 2014-09-15 DIAGNOSIS — I10 Essential (primary) hypertension: Secondary | ICD-10-CM | POA: Diagnosis not present

## 2014-09-15 DIAGNOSIS — E1142 Type 2 diabetes mellitus with diabetic polyneuropathy: Secondary | ICD-10-CM

## 2014-09-15 DIAGNOSIS — Z79899 Other long term (current) drug therapy: Secondary | ICD-10-CM | POA: Diagnosis not present

## 2014-09-15 DIAGNOSIS — E039 Hypothyroidism, unspecified: Secondary | ICD-10-CM

## 2014-09-15 DIAGNOSIS — M62838 Other muscle spasm: Secondary | ICD-10-CM

## 2014-09-15 DIAGNOSIS — M6248 Contracture of muscle, other site: Secondary | ICD-10-CM

## 2014-09-15 DIAGNOSIS — M25512 Pain in left shoulder: Secondary | ICD-10-CM

## 2014-09-15 DIAGNOSIS — M542 Cervicalgia: Secondary | ICD-10-CM

## 2014-09-15 DIAGNOSIS — M62830 Muscle spasm of back: Secondary | ICD-10-CM

## 2014-09-15 DIAGNOSIS — S36039D Unspecified laceration of spleen, subsequent encounter: Secondary | ICD-10-CM

## 2014-09-15 DIAGNOSIS — R300 Dysuria: Secondary | ICD-10-CM

## 2014-09-15 LAB — POCT UA - MICROSCOPIC ONLY
Casts, Ur, LPF, POC: NEGATIVE
Crystals, Ur, HPF, POC: NEGATIVE
Mucus, UA: NEGATIVE
RBC, urine, microscopic: NEGATIVE
Yeast, UA: NEGATIVE

## 2014-09-15 LAB — POCT WET PREP WITH KOH
KOH Prep POC: NEGATIVE
RBC Wet Prep HPF POC: NEGATIVE
Trichomonas, UA: NEGATIVE
Yeast Wet Prep HPF POC: NEGATIVE

## 2014-09-15 LAB — POCT CBC
Granulocyte percent: 74.6 %G (ref 37–80)
HCT, POC: 33.7 % — AB (ref 37.7–47.9)
Hemoglobin: 10.8 g/dL — AB (ref 12.2–16.2)
Lymph, poc: 2.2 (ref 0.6–3.4)
MCH, POC: 27.3 pg (ref 27–31.2)
MCHC: 31.9 g/dL (ref 31.8–35.4)
MCV: 85.7 fL (ref 80–97)
MID (cbc): 0.2 (ref 0–0.9)
MPV: 8.1 fL (ref 0–99.8)
POC Granulocyte: 6.9 (ref 2–6.9)
POC LYMPH PERCENT: 23.7 %L (ref 10–50)
POC MID %: 1.7 %M (ref 0–12)
Platelet Count, POC: 406 10*3/uL (ref 142–424)
RBC: 3.93 M/uL — AB (ref 4.04–5.48)
RDW, POC: 16.4 %
WBC: 9.3 10*3/uL (ref 4.6–10.2)

## 2014-09-15 LAB — POCT URINALYSIS DIPSTICK
Bilirubin, UA: NEGATIVE
Blood, UA: NEGATIVE
Glucose, UA: NEGATIVE
Ketones, UA: NEGATIVE
Leukocytes, UA: NEGATIVE
Nitrite, UA: NEGATIVE
Protein, UA: NEGATIVE
Spec Grav, UA: 1.025
Urobilinogen, UA: 0.2
pH, UA: 5.5

## 2014-09-15 LAB — GLUCOSE, POCT (MANUAL RESULT ENTRY): POC Glucose: 109 mg/dl — AB (ref 70–99)

## 2014-09-15 MED ORDER — NITROFURANTOIN MONOHYD MACRO 100 MG PO CAPS: 100.0000 mg | ORAL_CAPSULE | Freq: Two times a day (BID) | ORAL | Status: DC

## 2014-09-15 MED ORDER — MUPIROCIN 2 % EX OINT: 1.0000 "application " | TOPICAL_OINTMENT | Freq: Every day | CUTANEOUS | Status: DC
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
## 2014-09-16 ENCOUNTER — Telehealth: Payer: Self-pay

## 2014-09-16 LAB — COMPREHENSIVE METABOLIC PANEL
ALT: <8 U/L (ref 0–35)
AST: 9 U/L (ref 0–37)
Albumin: 4.2 g/dL (ref 3.5–5.2)
Alkaline Phosphatase: 73 U/L (ref 39–117)
BUN: 22 mg/dL (ref 6–23)
CO2: 27 mEq/L (ref 19–32)
Calcium: 9.6 mg/dL (ref 8.4–10.5)
Chloride: 99 mEq/L (ref 96–112)
Creat: 1.03 mg/dL (ref 0.50–1.10)
Glucose, Bld: 108 mg/dL — ABNORMAL HIGH (ref 70–99)
Potassium: 4.4 mEq/L (ref 3.5–5.3)
Sodium: 140 mEq/L (ref 135–145)
Total Bilirubin: 0.4 mg/dL (ref 0.2–1.2)
Total Protein: 7.1 g/dL (ref 6.0–8.3)

## 2014-09-16 NOTE — Telephone Encounter (Signed)
Pt says her return to work note says 09/25/2004..it should say 09/27/2014.

## 2014-09-16 NOTE — Telephone Encounter (Signed)
Letter fixed. Called pt to let her know it is ready to be picked up.

## 2014-09-17 LAB — URINE CULTURE
Colony Count: NO GROWTH
Organism ID, Bacteria: NO GROWTH

## 2014-10-11 ENCOUNTER — Encounter: Payer: Self-pay | Admitting: *Deleted

## 2014-10-30 ENCOUNTER — Other Ambulatory Visit: Payer: Self-pay | Admitting: Family Medicine

## 2014-11-01 NOTE — Telephone Encounter (Signed)
Dr Brigitte Pulse, pt recently had check up but this Rx has not been addressed since 2014 for recurring yeast inf under breast/panus. Do you want to give RFs or pt need eval?

## 2014-11-04 ENCOUNTER — Encounter: Payer: Self-pay | Admitting: Family Medicine

## 2014-11-22 ENCOUNTER — Other Ambulatory Visit: Payer: Self-pay

## 2014-12-12 ENCOUNTER — Ambulatory Visit (INDEPENDENT_AMBULATORY_CARE_PROVIDER_SITE_OTHER): Payer: 59 | Admitting: Family Medicine

## 2014-12-12 VITALS — BP 116/76 | HR 81 | Temp 98.5°F | Resp 16 | Ht 66.5 in | Wt 237.0 lb

## 2014-12-12 DIAGNOSIS — J019 Acute sinusitis, unspecified: Secondary | ICD-10-CM

## 2014-12-12 DIAGNOSIS — M6248 Contracture of muscle, other site: Secondary | ICD-10-CM

## 2014-12-12 DIAGNOSIS — M62838 Other muscle spasm: Secondary | ICD-10-CM

## 2014-12-12 DIAGNOSIS — I10 Essential (primary) hypertension: Secondary | ICD-10-CM | POA: Diagnosis not present

## 2014-12-12 DIAGNOSIS — S36039D Unspecified laceration of spleen, subsequent encounter: Secondary | ICD-10-CM | POA: Diagnosis not present

## 2014-12-12 DIAGNOSIS — E039 Hypothyroidism, unspecified: Secondary | ICD-10-CM

## 2014-12-12 DIAGNOSIS — E119 Type 2 diabetes mellitus without complications: Secondary | ICD-10-CM

## 2014-12-12 DIAGNOSIS — D62 Acute posthemorrhagic anemia: Secondary | ICD-10-CM | POA: Diagnosis not present

## 2014-12-12 LAB — COMPREHENSIVE METABOLIC PANEL
ALT: 8 U/L (ref 0–35)
AST: 14 U/L (ref 0–37)
Albumin: 4.3 g/dL (ref 3.5–5.2)
Alkaline Phosphatase: 42 U/L (ref 39–117)
BUN: 16 mg/dL (ref 6–23)
CO2: 30 mEq/L (ref 19–32)
Calcium: 9.2 mg/dL (ref 8.4–10.5)
Chloride: 101 mEq/L (ref 96–112)
Creat: 1.03 mg/dL (ref 0.50–1.10)
Glucose, Bld: 81 mg/dL (ref 70–99)
Potassium: 4.1 mEq/L (ref 3.5–5.3)
Sodium: 142 mEq/L (ref 135–145)
Total Bilirubin: 0.4 mg/dL (ref 0.2–1.2)
Total Protein: 6.5 g/dL (ref 6.0–8.3)

## 2014-12-12 LAB — POCT CBC
Granulocyte percent: 68 %G (ref 37–80)
HCT, POC: 38.6 % (ref 37.7–47.9)
Hemoglobin: 13 g/dL (ref 12.2–16.2)
Lymph, poc: 2.2 (ref 0.6–3.4)
MCH, POC: 27.9 pg (ref 27–31.2)
MCHC: 33.7 g/dL (ref 31.8–35.4)
MCV: 82.7 fL (ref 80–97)
MID (cbc): 0.5 (ref 0–0.9)
MPV: 8.2 fL (ref 0–99.8)
POC Granulocyte: 5.9 (ref 2–6.9)
POC LYMPH PERCENT: 25.8 %L (ref 10–50)
POC MID %: 6.2 %M (ref 0–12)
Platelet Count, POC: 272 10*3/uL (ref 142–424)
RBC: 4.67 M/uL (ref 4.04–5.48)
RDW, POC: 16.5 %
WBC: 8.7 10*3/uL (ref 4.6–10.2)

## 2014-12-12 LAB — POCT GLYCOSYLATED HEMOGLOBIN (HGB A1C): Hemoglobin A1C: 6.8

## 2014-12-12 LAB — TSH: TSH: 17.01 u[IU]/mL — ABNORMAL HIGH (ref 0.350–4.500)

## 2014-12-12 MED ORDER — METHOCARBAMOL 500 MG PO TABS: 500.0000 mg | ORAL_TABLET | Freq: Every evening | ORAL | Status: DC | PRN

## 2014-12-12 MED ORDER — FLUTICASONE PROPIONATE 50 MCG/ACT NA SUSP: 2.0000 | Freq: Every day | NASAL | Status: DC

## 2014-12-12 MED ORDER — GUAIFENESIN ER 1200 MG PO TB12: 1.0000 | ORAL_TABLET | Freq: Two times a day (BID) | ORAL | Status: DC | PRN

## 2014-12-12 MED ORDER — CLOBETASOL PROPIONATE 0.05 % EX LOTN: 1.0000 "application " | TOPICAL_LOTION | Freq: Two times a day (BID) | CUTANEOUS | Status: DC | PRN

## 2014-12-12 MED ORDER — CETIRIZINE HCL 10 MG PO TABS: 10.0000 mg | ORAL_TABLET | Freq: Every day | ORAL | Status: DC

## 2014-12-12 MED ORDER — AMOXICILLIN 875 MG PO TABS: 875.0000 mg | ORAL_TABLET | Freq: Three times a day (TID) | ORAL | Status: DC
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
## 2014-12-13 MED ORDER — METFORMIN HCL 500 MG PO TABS: 500.0000 mg | ORAL_TABLET | Freq: Every day | ORAL | Status: DC

## 2014-12-13 MED ORDER — LEVOTHYROXINE SODIUM 137 MCG PO TABS: 137.0000 ug | ORAL_TABLET | Freq: Every day | ORAL | Status: DC

## 2015-02-11 ENCOUNTER — Ambulatory Visit (INDEPENDENT_AMBULATORY_CARE_PROVIDER_SITE_OTHER): Payer: 59 | Admitting: Family Medicine

## 2015-02-11 VITALS — BP 138/80 | HR 97 | Temp 98.2°F | Resp 16 | Ht 66.0 in | Wt 234.0 lb

## 2015-02-11 DIAGNOSIS — I10 Essential (primary) hypertension: Secondary | ICD-10-CM | POA: Diagnosis not present

## 2015-02-11 DIAGNOSIS — Z23 Encounter for immunization: Secondary | ICD-10-CM

## 2015-02-11 DIAGNOSIS — R1012 Left upper quadrant pain: Secondary | ICD-10-CM | POA: Diagnosis not present

## 2015-02-11 DIAGNOSIS — E1142 Type 2 diabetes mellitus with diabetic polyneuropathy: Secondary | ICD-10-CM | POA: Diagnosis not present

## 2015-02-11 DIAGNOSIS — S36039D Unspecified laceration of spleen, subsequent encounter: Secondary | ICD-10-CM

## 2015-02-11 DIAGNOSIS — G8929 Other chronic pain: Secondary | ICD-10-CM | POA: Diagnosis not present

## 2015-02-11 DIAGNOSIS — E039 Hypothyroidism, unspecified: Secondary | ICD-10-CM

## 2015-02-11 DIAGNOSIS — R109 Unspecified abdominal pain: Secondary | ICD-10-CM

## 2015-02-11 LAB — POCT URINALYSIS DIPSTICK
Bilirubin, UA: NEGATIVE
Blood, UA: NEGATIVE
Glucose, UA: NEGATIVE
Leukocytes, UA: NEGATIVE
Nitrite, UA: NEGATIVE
Protein, UA: NEGATIVE
Spec Grav, UA: 1.025
Urobilinogen, UA: 0.2
pH, UA: 5

## 2015-02-11 LAB — POCT UA - MICROSCOPIC ONLY
Bacteria, U Microscopic: NEGATIVE
Casts, Ur, LPF, POC: NEGATIVE
Crystals, Ur, HPF, POC: NEGATIVE
Mucus, UA: NEGATIVE
Yeast, UA: NEGATIVE

## 2015-02-11 LAB — TSH: TSH: 1.08 u[IU]/mL (ref 0.350–4.500)

## 2015-02-11 LAB — GLUCOSE, POCT (MANUAL RESULT ENTRY): POC Glucose: 115 mg/dl — AB (ref 70–99)

## 2015-02-11 NOTE — Patient Instructions (Signed)
You are doing great - no med changes.  Lets see you back in about 4 months - we'll schedule you an appt.  Let me know whatever I can do to help.    Diabetes and Exercise Exercising regularly is important. It is not just about losing weight. It has many health benefits, such as:  Improving your overall fitness, flexibility, and endurance.  Increasing your bone density.  Helping with weight control.  Decreasing your body fat.  Increasing your muscle strength.  Reducing stress and tension.  Improving your overall health. People with diabetes who exercise gain additional benefits because exercise:  Reduces appetite.  Improves the body's use of blood sugar (glucose).  Helps lower or control blood glucose.  Decreases blood pressure.  Helps control blood lipids (such as cholesterol and triglycerides).  Improves the body's use of the hormone insulin by:  Increasing the body's insulin sensitivity.  Reducing the body's insulin needs.  Decreases the risk for heart disease because exercising:  Lowers cholesterol and triglycerides levels.  Increases the levels of good cholesterol (such as high-density lipoproteins [HDL]) in the body.  Lowers blood glucose levels. YOUR ACTIVITY PLAN  Choose an activity that you enjoy and set realistic goals. Your health care provider or diabetes educator can help you make an activity plan that works for you. Exercise regularly as directed by your health care provider. This includes:  Performing resistance training twice a week such as push-ups, sit-ups, lifting weights, or using resistance bands.  Performing 150 minutes of cardio exercises each week such as walking, running, or playing sports.  Staying active and spending no more than 90 minutes at one time being inactive. Even short bursts of exercise are good for you. Three 10-minute sessions spread throughout the day are just as beneficial as a single 30-minute session. Some exercise ideas  include:  Taking the dog for a walk.  Taking the stairs instead of the elevator.  Dancing to your favorite song.  Doing an exercise video.  Doing your favorite exercise with a friend. RECOMMENDATIONS FOR EXERCISING WITH TYPE 1 OR TYPE 2 DIABETES   Check your blood glucose before exercising. If blood glucose levels are greater than 240 mg/dL, check for urine ketones. Do not exercise if ketones are present.  Avoid injecting insulin into areas of the body that are going to be exercised. For example, avoid injecting insulin into:  The arms when playing tennis.  The legs when jogging.  Keep a record of:  Food intake before and after you exercise.  Expected peak times of insulin action.  Blood glucose levels before and after you exercise.  The type and amount of exercise you have done.  Review your records with your health care provider. Your health care provider will help you to develop guidelines for adjusting food intake and insulin amounts before and after exercising.  If you take insulin or oral hypoglycemic agents, watch for signs and symptoms of hypoglycemia. They include:  Dizziness.  Shaking.  Sweating.  Chills.  Confusion.  Drink plenty of water while you exercise to prevent dehydration or heat stroke. Body water is lost during exercise and must be replaced.  Talk to your health care provider before starting an exercise program to make sure it is safe for you. Remember, almost any type of activity is better than none. Document Released: 08/04/2003 Document Revised: 09/28/2013 Document Reviewed: 10/21/2012 [facility] Patient Information 2015 [place], [place]. This information is not intended to replace advice given to you by your health  care provider. Make sure you discuss any questions you have with your health care provider. Complementary and Alternative Medical Therapies for Diabetes Complementary and alternative medicines are health care practices or products  that are not always accepted as part of routine medicine. Complementary medicine is used along with routine medicine (medical therapy). Alternative medicine can sometimes be used instead of routine medicine. Some people use these methods to treat diabetes. While some of these therapies may be effective, others may not be. Some may even be harmful. Patients using these methods need to tell their caregiver. It is important to let your caregivers know what you are doing. Some of these therapies are discussed below. For more information, talk with your caregiver. THERAPIES Acupuncture Acupuncture is done by a professional who inserts needles into certain points on the skin. Some scientists believe that this triggers the release of the body's natural painkillers. It has been shown to relieve long-term (chronic) pain. This may help patients with painful nerve damage caused by diabetes. Biofeedback Biofeedback helps a person become more aware of the body's response to pain. It also helps you learn to deal with the pain. This alternative therapy focuses on relaxation and stress-reduction techniques. Thinking of peaceful mental images (guided imagery) is one technique. Some people believe these images can ease their condition. MEDICATIONS Chromium Several studies report that chromium supplements may improve diabetes control. Chromium helps insulin improve its action. Research is not yet certain. Supplements have not been recommended or approved. Caution is needed if you have kidney (renal) problems. Ginseng There are several types of ginseng plants. American ginseng is used for diabetes studies. Those studies have shown some glucose-lowering effects. Those effects have been seen with fasting and after-meal blood glucose levels. They have also been seen in A1c levels (average blood glucose levels over a 54-month period). More long-term studies are needed before recommendations for use of ginseng can be  made. Magnesium Experts have studied the relationship between magnesium and diabetes for many years. But it is not yet fully understood. Studies suggest that a low amount of magnesium may make blood glucose control worse in type 2 diabetes. Research also shows that a low amount may contribute to certain diabetes complications. One study showed that people who consume more magnesium had less risk of type 2 diabetes. Eating whole grains, nuts, and green leafy vegetables raises the magnesium level. Vanadium Vanadium is a compound found in tiny amounts in plants and animals. Early studies showed that vanadium improved blood glucose levels in animals with type 1 and type 2 diabetes. One study found that when given vanadium, those with diabetes were able to decrease their insulin dosage. Researchers still need to learn how it works in the body to discover any side effects, and to find safe dosages. Cinnamon There have been a couple of studies that seem to indicate cinnamon decreases insulin resistance and increases insulin production. By doing so, it may lower blood glucose. Exact doses are unknown, but it may work best when used in combination with other diabetes medicines. Document Released: 03/11/2007 Document Revised: 08/06/2011 Document Reviewed: 03/24/2009 [facility] Patient Information 2015 [place], [place]. This information is not intended to replace advice given to you by your health care provider. Make sure you discuss any questions you have with your health care provider.
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
## 2015-02-13 MED ORDER — LEVOTHYROXINE SODIUM 137 MCG PO TABS: 137.0000 ug | ORAL_TABLET | Freq: Every day | ORAL | Status: DC

## 2015-03-24 ENCOUNTER — Ambulatory Visit: Payer: Self-pay | Admitting: Family Medicine

## 2015-03-25 ENCOUNTER — Ambulatory Visit: Payer: Self-pay | Admitting: Family Medicine

## 2015-04-16 ENCOUNTER — Other Ambulatory Visit: Payer: Self-pay | Admitting: Family Medicine

## 2015-05-29 ENCOUNTER — Ambulatory Visit (INDEPENDENT_AMBULATORY_CARE_PROVIDER_SITE_OTHER): Payer: 59 | Admitting: Emergency Medicine

## 2015-05-29 VITALS — BP 120/79 | HR 91 | Temp 97.7°F | Resp 20 | Ht 66.0 in | Wt 235.4 lb

## 2015-05-29 DIAGNOSIS — J0140 Acute pansinusitis, unspecified: Secondary | ICD-10-CM

## 2015-05-29 DIAGNOSIS — I10 Essential (primary) hypertension: Secondary | ICD-10-CM

## 2015-05-29 DIAGNOSIS — J209 Acute bronchitis, unspecified: Secondary | ICD-10-CM

## 2015-05-29 DIAGNOSIS — E1342 Other specified diabetes mellitus with diabetic polyneuropathy: Secondary | ICD-10-CM | POA: Diagnosis not present

## 2015-05-29 MED ORDER — PSEUDOEPHEDRINE-GUAIFENESIN ER 60-600 MG PO TB12: 1.0000 | ORAL_TABLET | Freq: Two times a day (BID) | ORAL | Status: DC

## 2015-05-29 MED ORDER — HYDROCOD POLST-CPM POLST ER 10-8 MG/5ML PO SUER: 5.0000 mL | Freq: Two times a day (BID) | ORAL | Status: DC

## 2015-05-29 MED ORDER — AMOXICILLIN-POT CLAVULANATE 875-125 MG PO TABS: 1.0000 | ORAL_TABLET | Freq: Two times a day (BID) | ORAL | Status: DC

## 2015-05-29 NOTE — Progress Notes (Signed)
Subjective:  Patient ID: Connie [name], female    DOB: 1960/06/16  Age: 55 y.o. MRN: [id number]  CC: Sore Throat; Sinusitis; and Cough   HPI [name] presents   Patient has nasal congestion postnasal drainage and sore throat started Friday. She has purulent nasal drainage. She has a sensation of being chills. No documented fever. She has a cough productive purulent sputum. She has no wheezing or shortness of breath. She has no nausea or vomiting or stool change. She has no rash. She has no improvement of her condition with over-the-counter medication.  History Connie [name] has a past medical history of Allergic rhinitis; Hyperlipidemia; Hypertension; Lower extremity edema; Hypothyroid; Arthritis; Diabetes mellitus without complication ([place]); and Anxiety.   She has past surgical history that includes Cholecystectomy; Cervical spine surgery; Abdominal hysterectomy (2008); Oophorectomy (2008); Joint replacement; and Cesarean section.   Her  family history includes Cancer in her paternal grandmother; Diabetes in her maternal grandmother and other; Heart disease in her maternal grandfather and maternal grandmother; Hypertension in her other; Pancreatic cancer in her other; Stroke in her other; Stroke (age of onset: 45) in her mother. There is no history of Coronary artery disease.  She   reports that she has never smoked. She has never used smokeless tobacco. She reports that she drinks alcohol. She reports that she does not use illicit drugs.  Outpatient Prescriptions Prior to Visit  Medication Sig Dispense Refill  . acetaminophen (TYLENOL) 500 MG tablet Take 1,000 mg by mouth every 6 (six) hours as needed for moderate pain or headache.    . Blood Glucose Monitoring Suppl KIT Use as directed. Dx: 250.00 1 each 0  . fluticasone (FLONASE) 50 MCG/ACT nasal spray Place 2 sprays into both nostrils at bedtime. 16 g 2  . glucose blood test strip Use as instructed to check cbgs bid. 100 each 12    . Guaifenesin (MUCINEX MAXIMUM STRENGTH) 1200 MG TB12 Take 1 tablet (1,200 mg total) by mouth every 12 (twelve) hours as needed. 14 tablet 1  . Lancets MISC Check blood glucose twice a week. DX:250.00 100 each 0  . levothyroxine (SYNTHROID, LEVOTHROID) 137 MCG tablet Take 1 tablet (137 mcg total) by mouth daily before breakfast. 90 tablet 1  . lisinopril-hydrochlorothiazide (PRINZIDE,ZESTORETIC) 20-25 MG tablet TAKE 1 TABLET BY MOUTH DAILY 90 tablet 1  . metFORMIN (GLUCOPHAGE) 500 MG tablet Take 1 tablet (500 mg total) by mouth daily with breakfast. 90 tablet 1  . methocarbamol (ROBAXIN) 500 MG tablet Take 1-2 tablets (500-1,000 mg total) by mouth at bedtime as needed for muscle spasms. 45 tablet 3  . Multiple Vitamin (MULTIVITAMIN WITH MINERALS) TABS tablet Take 1 tablet by mouth daily.    . cetirizine (ZYRTEC) 10 MG tablet Take 1 tablet (10 mg total) by mouth at bedtime. (Patient not taking: Reported on 05/29/2015) 30 tablet 11  . Clobetasol Propionate 0.05 % lotion Apply 1 application topically 2 (two) times daily as needed (for scalp). (Patient not taking: Reported on 05/29/2015) 59 mL 5  . loratadine (CLARITIN) 10 MG tablet Take 10 mg by mouth daily. Reported on 05/29/2015     No facility-administered medications prior to visit.    Social History   Social History  . Marital Status: Divorced    Spouse Name: N/A  . Number of Children: 2  . Years of Education: N/A   Occupational History  . Land   Social History Main Topics  . Smoking status:  Never Smoker   . Smokeless tobacco: Never Used  . Alcohol Use: 0.0 oz/week    0 Standard drinks or equivalent per week  . Drug Use: No  . Sexual Activity: No   Other Topics Concern  . None   Social History Narrative   Marital status: divorced; not dating.       Children:  2 daughters (26, 54); no grandchildren.      Employment: [employer] supervisor      Tobacco; none since high school       Alcohol:  Weekends.   Does  not exercise regularly           Review of Systems  Constitutional: Positive for chills. Negative for fever and appetite change.  HENT: Positive for congestion, postnasal drip, rhinorrhea and sinus pressure. Negative for ear pain and sore throat.   Eyes: Negative for pain and redness.  Respiratory: Positive for cough. Negative for shortness of breath and wheezing.   Cardiovascular: Negative for leg swelling.  Gastrointestinal: Negative for nausea, vomiting, abdominal pain, diarrhea, constipation and blood in stool.  Endocrine: Negative for polyuria.  Genitourinary: Negative for dysuria, urgency, frequency and flank pain.  Musculoskeletal: Negative for gait problem.  Skin: Negative for rash.  Neurological: Negative for weakness and headaches.  Psychiatric/Behavioral: Negative for confusion and decreased concentration. The patient is not nervous/anxious.     Objective:  BP 120/79 mmHg  Pulse 91  Temp(Src) 97.7 F (36.5 C) (Oral)  Resp 20  Ht 5' 6"  (1.676 m)  Wt 235 lb 6.4 oz (106.777 kg)  BMI 38.01 kg/m2  SpO2 98%  Physical Exam  Constitutional: She is oriented to person, place, and time. She appears well-developed and well-nourished. No distress.  HENT:  Head: Normocephalic and atraumatic.  Right Ear: External ear normal.  Left Ear: External ear normal.  Nose: Nose normal.  Eyes: Conjunctivae and EOM are normal. Pupils are equal, round, and reactive to light. No scleral icterus.  Neck: Normal range of motion. Neck supple. No tracheal deviation present.  Cardiovascular: Normal rate, regular rhythm and normal heart sounds.   Pulmonary/Chest: Effort normal. No respiratory distress. She has no wheezes. She has no rales.  Abdominal: She exhibits no mass. There is no tenderness. There is no rebound and no guarding.  Musculoskeletal: She exhibits no edema.  Lymphadenopathy:    She has no cervical adenopathy.  Neurological: She is alert and oriented to person, place, and time.  Coordination normal.  Skin: Skin is warm and dry. No rash noted.  Psychiatric: She has a normal mood and affect. Her behavior is normal.      Assessment & Plan:   Connie [name] was seen today for sore throat, sinusitis and cough.  Diagnoses and all orders for this visit:  Acute bronchitis, unspecified organism  Acute pansinusitis, recurrence not specified  Essential hypertension  Other specified diabetes mellitus with diabetic polyneuropathy ([place])  Other orders -     amoxicillin-clavulanate (AUGMENTIN) 875-125 MG tablet; Take 1 tablet by mouth 2 (two) times daily. -     pseudoephedrine-guaifenesin (MUCINEX D) 60-600 MG 12 hr tablet; Take 1 tablet by mouth every 12 (twelve) hours. -     chlorpheniramine-HYDROcodone (TUSSIONEX PENNKINETIC ER) 10-8 MG/5ML SUER; Take 5 mLs by mouth 2 (two) times daily.  I am having Ms. Gubler start on amoxicillin-clavulanate, pseudoephedrine-guaifenesin, and chlorpheniramine-HYDROcodone. I am also having her maintain her Blood Glucose Monitoring Suppl, Lancets, acetaminophen, loratadine, multivitamin with minerals, glucose blood, Clobetasol Propionate, methocarbamol, cetirizine, fluticasone, Guaifenesin, metFORMIN, levothyroxine,  lisinopril-hydrochlorothiazide, amoxicillin, and ibuprofen.  Meds ordered this encounter  Medications  . amoxicillin (AMOXIL) 875 MG tablet    Sig: Take 875 mg by mouth 2 (two) times daily.  Marland Kitchen ibuprofen (ADVIL,MOTRIN) 200 MG tablet    Sig: Take 200 mg by mouth every 6 (six) hours as needed.  Marland Kitchen amoxicillin-clavulanate (AUGMENTIN) 875-125 MG tablet    Sig: Take 1 tablet by mouth 2 (two) times daily.    Dispense:  20 tablet    Refill:  0  . pseudoephedrine-guaifenesin (MUCINEX D) 60-600 MG 12 hr tablet    Sig: Take 1 tablet by mouth every 12 (twelve) hours.    Dispense:  18 tablet    Refill:  0  . chlorpheniramine-HYDROcodone (TUSSIONEX PENNKINETIC ER) 10-8 MG/5ML SUER    Sig: Take 5 mLs by mouth 2 (two) times daily.     Dispense:  60 mL    Refill:  0    Appropriate red flag conditions were discussed with the patient as well as actions that should be taken.  Patient expressed his understanding.  Follow-up: Return if symptoms worsen or fail to improve.  Roselee Culver, MD

## 2015-05-29 NOTE — Patient Instructions (Signed)

## 2015-06-08 ENCOUNTER — Ambulatory Visit (INDEPENDENT_AMBULATORY_CARE_PROVIDER_SITE_OTHER): Payer: 59 | Admitting: Family Medicine

## 2015-06-08 VITALS — BP 110/70 | HR 90 | Temp 98.3°F | Resp 20 | Ht 67.0 in | Wt 234.0 lb

## 2015-06-08 DIAGNOSIS — J0110 Acute frontal sinusitis, unspecified: Secondary | ICD-10-CM

## 2015-06-08 MED ORDER — HYDROCOD POLST-CPM POLST ER 10-8 MG/5ML PO SUER: 5.0000 mL | Freq: Two times a day (BID) | ORAL | Status: DC

## 2015-06-08 NOTE — Progress Notes (Signed)
  Subjective:     Connie [name] is a 55 y.o. female who presents for evaluation of sinus pain. Dx with bronchitis/pansinusitis on 05/29/15 but still having some Sx.  Still taking medication as did miss two doses.  Does take mucinex DM 1200 mg BID.    The following portions of the patient's history were reviewed and updated as appropriate: allergies, current medications, past family history, past medical history, past social history, past surgical history and problem list.  Review of Systems Pertinent items are noted in HPI.   Objective:    BP 110/70 mmHg  Pulse 90  Temp(Src) 98.3 F (36.8 C) (Oral)  Resp 20  Ht 5\' 7"  (1.702 m)  Wt 234 lb (106.142 kg)  BMI 36.64 kg/m2  SpO2 98% General appearance: alert, cooperative and appears stated age Head: Normocephalic, without obvious abnormality, atraumatic Lungs: clear to auscultation bilaterally Heart: regular rate and rhythm, S1, S2 normal, no murmur, click, rub or gallop Head: SInus NTP, O/P clear, no LAD.        Assessment:    Sinusitis, Resolving  Plan:   Continue cough medication Resolving at this point but would recommend nasal saline/netti pot and zyrtec.   F/U PRN

## 2015-06-16 ENCOUNTER — Ambulatory Visit (INDEPENDENT_AMBULATORY_CARE_PROVIDER_SITE_OTHER): Payer: 59 | Admitting: Family Medicine

## 2015-06-16 ENCOUNTER — Encounter: Payer: Self-pay | Admitting: Family Medicine

## 2015-06-16 VITALS — BP 117/79 | HR 85 | Temp 98.9°F | Resp 16 | Ht 67.0 in | Wt 229.0 lb

## 2015-06-16 DIAGNOSIS — S36039D Unspecified laceration of spleen, subsequent encounter: Secondary | ICD-10-CM

## 2015-06-16 DIAGNOSIS — E039 Hypothyroidism, unspecified: Secondary | ICD-10-CM

## 2015-06-16 DIAGNOSIS — I10 Essential (primary) hypertension: Secondary | ICD-10-CM

## 2015-06-16 DIAGNOSIS — R300 Dysuria: Secondary | ICD-10-CM

## 2015-06-16 DIAGNOSIS — Z1321 Encounter for screening for nutritional disorder: Secondary | ICD-10-CM | POA: Diagnosis not present

## 2015-06-16 DIAGNOSIS — K760 Fatty (change of) liver, not elsewhere classified: Secondary | ICD-10-CM | POA: Diagnosis not present

## 2015-06-16 DIAGNOSIS — Z113 Encounter for screening for infections with a predominantly sexual mode of transmission: Secondary | ICD-10-CM | POA: Diagnosis not present

## 2015-06-16 DIAGNOSIS — E1342 Other specified diabetes mellitus with diabetic polyneuropathy: Secondary | ICD-10-CM

## 2015-06-16 DIAGNOSIS — E785 Hyperlipidemia, unspecified: Secondary | ICD-10-CM | POA: Diagnosis not present

## 2015-06-16 LAB — POCT URINALYSIS DIP (MANUAL ENTRY)
Bilirubin, UA: NEGATIVE
Glucose, UA: NEGATIVE
Ketones, POC UA: NEGATIVE
Nitrite, UA: NEGATIVE
Protein Ur, POC: NEGATIVE
Spec Grav, UA: 1.02
Urobilinogen, UA: 0.2
pH, UA: 6.5

## 2015-06-16 LAB — COMPREHENSIVE METABOLIC PANEL
ALT: 8 U/L (ref 6–29)
AST: 13 U/L (ref 10–35)
Albumin: 4.5 g/dL (ref 3.6–5.1)
Alkaline Phosphatase: 50 U/L (ref 33–130)
BUN: 15 mg/dL (ref 7–25)
CO2: 28 mmol/L (ref 20–31)
Calcium: 9.3 mg/dL (ref 8.6–10.4)
Chloride: 98 mmol/L (ref 98–110)
Creat: 1.03 mg/dL (ref 0.50–1.05)
Glucose, Bld: 94 mg/dL (ref 65–99)
Potassium: 4.1 mmol/L (ref 3.5–5.3)
Sodium: 140 mmol/L (ref 135–146)
Total Bilirubin: 0.7 mg/dL (ref 0.2–1.2)
Total Protein: 6.7 g/dL (ref 6.1–8.1)

## 2015-06-16 LAB — POC MICROSCOPIC URINALYSIS (UMFC): Mucus: ABSENT

## 2015-06-16 LAB — CBC
HCT: 38.3 % (ref 36.0–46.0)
Hemoglobin: 13 g/dL (ref 12.0–15.0)
MCH: 29.2 pg (ref 26.0–34.0)
MCHC: 33.9 g/dL (ref 30.0–36.0)
MCV: 86.1 fL (ref 78.0–100.0)
MPV: 10.5 fL (ref 8.6–12.4)
Platelets: 345 10*3/uL (ref 150–400)
RBC: 4.45 MIL/uL (ref 3.87–5.11)
RDW: 15.2 % (ref 11.5–15.5)
WBC: 8 10*3/uL (ref 4.0–10.5)

## 2015-06-16 LAB — MICROALBUMIN / CREATININE URINE RATIO
Creatinine, Urine: 151 mg/dL (ref 20–320)
Microalb Creat Ratio: 5 mcg/mg creat (ref <30)
Microalb, Ur: 0.8 mg/dL

## 2015-06-16 LAB — POCT GLYCOSYLATED HEMOGLOBIN (HGB A1C): Hemoglobin A1C: 6.7

## 2015-06-16 LAB — HEPATITIS C ANTIBODY: HCV Ab: NEGATIVE

## 2015-06-16 LAB — VITAMIN D 25 HYDROXY (VIT D DEFICIENCY, FRACTURES): Vit D, 25-Hydroxy: 14 ng/mL — ABNORMAL LOW (ref 30–100)

## 2015-06-16 LAB — HIV ANTIBODY (ROUTINE TESTING W REFLEX): HIV 1&2 Ab, 4th Generation: NONREACTIVE

## 2015-06-16 MED ORDER — METFORMIN HCL 500 MG PO TABS: 500.0000 mg | ORAL_TABLET | Freq: Two times a day (BID) | ORAL | Status: DC

## 2015-06-16 MED ORDER — PHENAZOPYRIDINE HCL 200 MG PO TABS: 200.0000 mg | ORAL_TABLET | Freq: Three times a day (TID) | ORAL | Status: DC | PRN

## 2015-06-16 MED ORDER — NITROFURANTOIN MONOHYD MACRO 100 MG PO CAPS: 100.0000 mg | ORAL_CAPSULE | Freq: Two times a day (BID) | ORAL | Status: DC

## 2015-06-16 NOTE — Progress Notes (Signed)
Subjective:    Patient ID: Connie [name], female    DOB: 1960/07/17, 55 y.o.   MRN: [id number] Chief Complaint  Patient presents with  . Follow-up  . Diabetes  . Dysuria    started this morning    HPI  Occ gets a little lightheaded and cold. Is not fasting today - had 2 eggs and toast this morning  UTI sxs started this morning with just a painful sensation and increased frequency.  SHe is really trying to drink a lot of water. She is doing green tea and cranberry which often helps.  She is taking augmentin still for her prior URI sxs.  Taking airborne immune.  Pt working all the time.  She had a lotion for her scalp sent in rather than a solution but the quantity was to small and she couldn't get as much.  Is using neosporin and scalp-in otc shampoo which has a more mild ingredient but working somewhat. Changing shampoos - using head and shoulders and suave alternating with better control.  Using a nasal spray that starts with a B - brown bottle with white label.  Using ibuprofen occ as needed - not most days. <2x/wk.  Has been off the levothyroxine for 2 weeks now - just restarted.  Oldest daughter is 95 and is due with pt's first granddchild in May - a girl! - and is getting married!  Younger is esthitician and moving. Past Medical History  Diagnosis Date  . Allergic rhinitis   . Hyperlipidemia   . Hypertension   . Lower extremity edema   . Hypothyroid   . Arthritis   . Diabetes mellitus without complication ([place])   . Anxiety    Past Surgical History  Procedure Laterality Date  . Cholecystectomy    . Cervical spine surgery      Ruptured disc  . Abdominal hysterectomy  2008  . Oophorectomy  2008  . Joint replacement    . Cesarean section     Current Outpatient Prescriptions on File Prior to Visit  Medication Sig Dispense Refill  . acetaminophen (TYLENOL) 500 MG tablet Take 1,000 mg by mouth every 6 (six) hours as needed for moderate pain or headache.    Marland Kitchen  amoxicillin (AMOXIL) 875 MG tablet Take 875 mg by mouth 2 (two) times daily.    . cetirizine (ZYRTEC) 10 MG tablet Take 1 tablet (10 mg total) by mouth at bedtime. 30 tablet 11  . Clobetasol Propionate 0.05 % lotion Apply 1 application topically 2 (two) times daily as needed (for scalp). 59 mL 5  . fluticasone (FLONASE) 50 MCG/ACT nasal spray Place 2 sprays into both nostrils at bedtime. 16 g 2  . glucose blood test strip Use as instructed to check cbgs bid. 100 each 12  . Guaifenesin (MUCINEX MAXIMUM STRENGTH) 1200 MG TB12 Take 1 tablet (1,200 mg total) by mouth every 12 (twelve) hours as needed. 14 tablet 1  . ibuprofen (ADVIL,MOTRIN) 200 MG tablet Take 200 mg by mouth every 6 (six) hours as needed.    . Lancets MISC Check blood glucose twice a week. DX:250.00 100 each 0  . levothyroxine (SYNTHROID, LEVOTHROID) 137 MCG tablet Take 1 tablet (137 mcg total) by mouth daily before breakfast. 90 tablet 1  . lisinopril-hydrochlorothiazide (PRINZIDE,ZESTORETIC) 20-25 MG tablet TAKE 1 TABLET BY MOUTH DAILY 90 tablet 1  . metFORMIN (GLUCOPHAGE) 500 MG tablet Take 1 tablet (500 mg total) by mouth daily with breakfast. 90 tablet 1  . methocarbamol (ROBAXIN)  500 MG tablet Take 1-2 tablets (500-1,000 mg total) by mouth at bedtime as needed for muscle spasms. 45 tablet 3   No current facility-administered medications on file prior to visit.   Allergies  Allergen Reactions  . Niacin Other (See Comments)    Turns red from chest up and body gets hot.  . Other Other (See Comments)    IBS sx caused by celery, avocado, whole milk, almonds  . Codeine Other (See Comments)    Becomes incoherent  . Pravastatin Other (See Comments)    Joint pain    Family History  Problem Relation Age of Onset  . Stroke Mother 15  . Coronary artery disease Neg Hx     Premature or sudden cardiac death  . Diabetes Other     1st degree relative  . Hypertension Other   . Pancreatic cancer Other   . Stroke Other      Grandmother  . Heart disease Maternal Grandmother   . Diabetes Maternal Grandmother   . Heart disease Maternal Grandfather   . Cancer Paternal Grandmother    Social History   Social History  . Marital Status: Divorced    Spouse Name: N/A  . Number of Children: 2  . Years of Education: N/A   Occupational History  . Land   Social History Main Topics  . Smoking status: Never Smoker   . Smokeless tobacco: Never Used  . Alcohol Use: 0.0 oz/week    0 Standard drinks or equivalent per week  . Drug Use: No  . Sexual Activity: No   Other Topics Concern  . None   Social History Narrative   Marital status: divorced; not dating.       Children:  2 daughters (26, 33); no grandchildren.      Employment: [employer] supervisor      Tobacco; none since high school       Alcohol:  Weekends.   Does not exercise regularly            Review of Systems  Constitutional: Positive for fatigue. Negative for fever, chills, activity change and appetite change.  HENT: Positive for congestion and postnasal drip. Negative for rhinorrhea.   Gastrointestinal: Positive for abdominal pain and abdominal distention. Negative for nausea, vomiting, diarrhea, constipation, blood in stool and anal bleeding.  Endocrine: Positive for cold intolerance.  Genitourinary: Positive for dysuria, urgency, frequency and flank pain. Negative for decreased urine volume, enuresis and difficulty urinating.  Skin: Positive for rash. Negative for color change and wound.  Neurological: Positive for light-headedness.       Objective:  BP 117/79 mmHg  Pulse 85  Temp(Src) 98.9 F (37.2 C)  Resp 16  Ht 5\' 7"  (1.702 m)  Wt 229 lb (103.874 kg)  BMI 35.86 kg/m2  Physical Exam  Constitutional: She is oriented to person, place, and time. She appears well-developed and well-nourished. No distress.  HENT:  Head: Normocephalic and atraumatic.  Right Ear: External ear normal.  Left Ear: External ear  normal.  Eyes: Conjunctivae are normal. No scleral icterus.  Neck: Normal range of motion. Neck supple. No thyromegaly present.  Cardiovascular: Normal rate, regular rhythm, normal heart sounds and intact distal pulses.   Pulmonary/Chest: Effort normal and breath sounds normal. No respiratory distress.  Abdominal: Soft. Normal appearance and bowel sounds are normal. She exhibits no distension and no mass. There is no hepatosplenomegaly. There is generalized tenderness. There is no rebound, no guarding and no  CVA tenderness.  Musculoskeletal: She exhibits no edema.  Lymphadenopathy:    She has no cervical adenopathy.  Neurological: She is alert and oriented to person, place, and time.  Skin: Skin is warm and dry. She is not diaphoretic. No erythema.  Psychiatric: She has a normal mood and affect. Her behavior is normal.          Assessment & Plan:   Fasting labs for next visit.  1. Essential hypertension   2. Hypothyroidism, unspecified hypothyroidism type - tsh nml 4 mos ago, cont levothyroxine 137, did not check today since pt has been off of levothyroxine x 2 wks, not restarted.  Ok to refill x 3 mo supply whenever pharmacy requests.  3. Other specified diabetes mellitus with diabetic polyneuropathy ([facility]) - Urine microalbumin and lipid done 04/2014 nml. Increase metformin 500 from qd to bid as a1c 6.8 6 mos prior -> 6.7 today  4. Hepatic steatosis   5. Hyperlipidemia LDL goal <100 - not fasting today so check lipids at next OV. Allergic/intolerant of niacin and pravastatin. She was prescribed lipitor prior but unsure if she ever tried it  6. Splenic laceration, subsequent encounter   7. Encounter for vitamin deficiency screening   8. Screening for STD (sexually transmitted disease)   9. Dysuria - sxs just started today, UA relatively nml with small leuks so cover with macrobid while clx P.    Orders Placed This Encounter  Procedures  . Urine culture  . Comprehensive metabolic  panel    Order Specific Question:  Has the patient fasted?    Answer:  Yes  . CBC  . Microalbumin/Creatinine Ratio, Urine  . VITAMIN D 25 Hydroxy (Vit-D Deficiency, Fractures)  . HIV antibody  . Hepatitis C Antibody  . POCT glycosylated hemoglobin (Hb A1C)  . POCT urinalysis dipstick  . POCT Microscopic Urinalysis ([facility])    Meds ordered this encounter  Medications  . metFORMIN (GLUCOPHAGE) 500 MG tablet    Sig: Take 1 tablet (500 mg total) by mouth 2 (two) times daily with a meal.    Dispense:  180 tablet    Refill:  1  . DISCONTD: phenazopyridine (PYRIDIUM) 200 MG tablet    Sig: Take 1 tablet (200 mg total) by mouth 3 (three) times daily as needed for pain.    Dispense:  10 tablet    Refill:  0  . nitrofurantoin, macrocrystal-monohydrate, (MACROBID) 100 MG capsule    Sig: Take 1 capsule (100 mg total) by mouth 2 (two) times daily.    Dispense:  14 capsule    Refill:  0  . phenazopyridine (PYRIDIUM) 200 MG tablet    Sig: Take 1 tablet (200 mg total) by mouth 3 (three) times daily as needed for pain (urinary symptoms).    Dispense:  20 tablet    Refill:  0    [name], MD MPH  Results for orders placed or performed in visit on 06/16/15  Urine culture  Result Value Ref Range   Colony Count NO GROWTH    Organism ID, Bacteria NO GROWTH   Comprehensive metabolic panel  Result Value Ref Range   Sodium 140 135 - 146 mmol/L   Potassium 4.1 3.5 - 5.3 mmol/L   Chloride 98 98 - 110 mmol/L   CO2 28 20 - 31 mmol/L   Glucose, Bld 94 65 - 99 mg/dL   BUN 15 7 - 25 mg/dL   Creat 1.03 0.50 - 1.05 mg/dL   Total Bilirubin 0.7 0.2 -  1.2 mg/dL   Alkaline Phosphatase 50 33 - 130 U/L   AST 13 10 - 35 U/L   ALT 8 6 - 29 U/L   Total Protein 6.7 6.1 - 8.1 g/dL   Albumin 4.5 3.6 - 5.1 g/dL   Calcium 9.3 8.6 - 10.4 mg/dL  CBC  Result Value Ref Range   WBC 8.0 4.0 - 10.5 K/uL   RBC 4.45 3.87 - 5.11 MIL/uL   Hemoglobin 13.0 12.0 - 15.0 g/dL   HCT 38.3 36.0 - 46.0 %   MCV 86.1 78.0 -  100.0 fL   MCH 29.2 26.0 - 34.0 pg   MCHC 33.9 30.0 - 36.0 g/dL   RDW 15.2 11.5 - 15.5 %   Platelets 345 150 - 400 K/uL   MPV 10.5 8.6 - 12.4 fL  Microalbumin/Creatinine Ratio, Urine  Result Value Ref Range   Creatinine, Urine 151 20 - 320 mg/dL   Microalb, Ur 0.8 Not estab mg/dL   Microalb Creat Ratio 5 <30 mcg/mg creat  VITAMIN D 25 Hydroxy (Vit-D Deficiency, Fractures)  Result Value Ref Range   Vit D, 25-Hydroxy 14 (L) 30 - 100 ng/mL  HIV antibody  Result Value Ref Range   HIV 1&2 Ab, 4th Generation NONREACTIVE NONREACTIVE  Hepatitis C Antibody  Result Value Ref Range   HCV Ab NEGATIVE NEGATIVE  POCT glycosylated hemoglobin (Hb A1C)  Result Value Ref Range   Hemoglobin A1C 6.7   POCT urinalysis dipstick  Result Value Ref Range   Color, UA yellow yellow   Clarity, UA clear clear   Glucose, UA negative negative   Bilirubin, UA negative negative   Ketones, POC UA negative negative   Spec Grav, UA 1.020    Blood, UA trace-intact (A) negative   pH, UA 6.5    Protein Ur, POC negative negative   Urobilinogen, UA 0.2    Nitrite, UA Negative Negative   Leukocytes, UA small (1+) (A) Negative  POCT Microscopic Urinalysis ([facility])  Result Value Ref Range   WBC,UR,HPF,POC Few (A) None WBC/hpf   RBC,UR,HPF,POC None None RBC/hpf   Bacteria None None, Too numerous to count   Mucus Absent Absent   Epithelial Cells, UR Per Microscopy Few (A) None, Too numerous to count cells/hpf

## 2015-06-16 NOTE — Patient Instructions (Signed)
Complementary and Alternative Medical Therapies for Diabetes Complementary and alternative medicines are health care practices or products that are not always accepted as part of routine medicine. Complementary medicine is used along with routine medicine (medical therapy). Alternative medicine can sometimes be used instead of routine medicine. Some people use these methods to treat diabetes. While some of these therapies may be effective, others may not be. Some may even be harmful. Patients using these methods need to tell their caregiver. It is important to let your caregivers know what you are doing. Some of these therapies are discussed below. For more information, talk with your caregiver. THERAPIES Acupuncture Acupuncture is done by a professional who inserts needles into certain points on the skin. Some scientists believe that this triggers the release of the body's natural painkillers. It has been shown to relieve long-term (chronic) pain. This may help patients with painful nerve damage caused by diabetes. Biofeedback Biofeedback helps a person become more aware of the body's response to pain. It also helps you learn to deal with the pain. This alternative therapy focuses on relaxation and stress-reduction techniques. Thinking of peaceful mental images (guided imagery) is one technique. Some people believe these images can ease their condition. MEDICATIONS Chromium Several studies report that chromium supplements may improve diabetes control. Chromium helps insulin improve its action. Research is not yet certain. Supplements have not been recommended or approved. Caution is needed if you have kidney (renal) problems. Ginseng There are several types of ginseng plants. American ginseng is used for diabetes studies. Those studies have shown some glucose-lowering effects. Those effects have been seen with fasting and after-meal blood glucose levels. They have also been seen in A1c levels (average  blood glucose levels over a 3-month period). More long-term studies are needed before recommendations for use of ginseng can be made. Magnesium Experts have studied the relationship between magnesium and diabetes for many years. But it is not yet fully understood. Studies suggest that a low amount of magnesium may make blood glucose control worse in type 2 diabetes. Research also shows that a low amount may contribute to certain diabetes complications. One study showed that people who consume more magnesium had less risk of type 2 diabetes. Eating whole grains, nuts, and green leafy vegetables raises the magnesium level. Vanadium Vanadium is a compound found in tiny amounts in plants and animals. Early studies showed that vanadium improved blood glucose levels in animals with type 1 and type 2 diabetes. One study found that when given vanadium, those with diabetes were able to decrease their insulin dosage. Researchers still need to learn how it works in the body to discover any side effects, and to find safe dosages. Cinnamon There have been a couple of studies that seem to indicate cinnamon decreases insulin resistance and increases insulin production. By doing so, it may lower blood glucose. Exact doses are unknown, but it may work best when used in combination with other diabetes medicines.   This information is not intended to replace advice given to you by your health care provider. Make sure you discuss any questions you have with your health care provider.   Document Released: 03/11/2007 Document Revised: 08/06/2011 Document Reviewed: 03/24/2009 Elsevier Interactive Patient Education 2016 [employer]  

## 2015-06-17 LAB — URINE CULTURE
Colony Count: NO GROWTH
Organism ID, Bacteria: NO GROWTH

## 2015-06-20 MED ORDER — CLOBETASOL PROPIONATE 0.05 % EX SHAM: MEDICATED_SHAMPOO | CUTANEOUS | Status: DC

## 2015-06-21 ENCOUNTER — Encounter: Payer: Self-pay | Admitting: Family Medicine

## 2015-08-09 ENCOUNTER — Other Ambulatory Visit: Payer: Self-pay | Admitting: Family Medicine

## 2015-09-20 ENCOUNTER — Other Ambulatory Visit: Payer: Self-pay

## 2015-09-20 ENCOUNTER — Telehealth: Payer: Self-pay

## 2015-09-20 MED ORDER — CLOBETASOL PROPIONATE 0.05 % EX SHAM: MEDICATED_SHAMPOO | CUTANEOUS | Status: DC

## 2015-09-20 NOTE — Telephone Encounter (Signed)
Pharm req'd change to clobet solution (shampoo) instead of lotion. I sent last Rx written by Dr Brigitte Pulse to new pharm.

## 2015-09-28 ENCOUNTER — Telehealth: Payer: Self-pay

## 2015-09-28 NOTE — Telephone Encounter (Signed)
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 Pt had reported that the spray does not work as well for her as the shampoo. It looks like the gel is also preferred, but pt could not use that on her scalp. Pending.

## 2015-10-03 NOTE — Telephone Encounter (Signed)
PA approved through 09/27/16. Notified pharm.

## 2015-12-01 ENCOUNTER — Other Ambulatory Visit: Payer: Self-pay | Admitting: Family Medicine
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
## 2015-12-21 IMAGING — CR DG CHEST 1V
2 series · 2 of 2 positions shown · non-contrast
Comparison: CT of 08/22/2014 and plain film of 08/22/2014.

CLINICAL DATA: Pleural effusion.  Shortness of breath.

EXAM:
CHEST  1 VIEW

[PA]
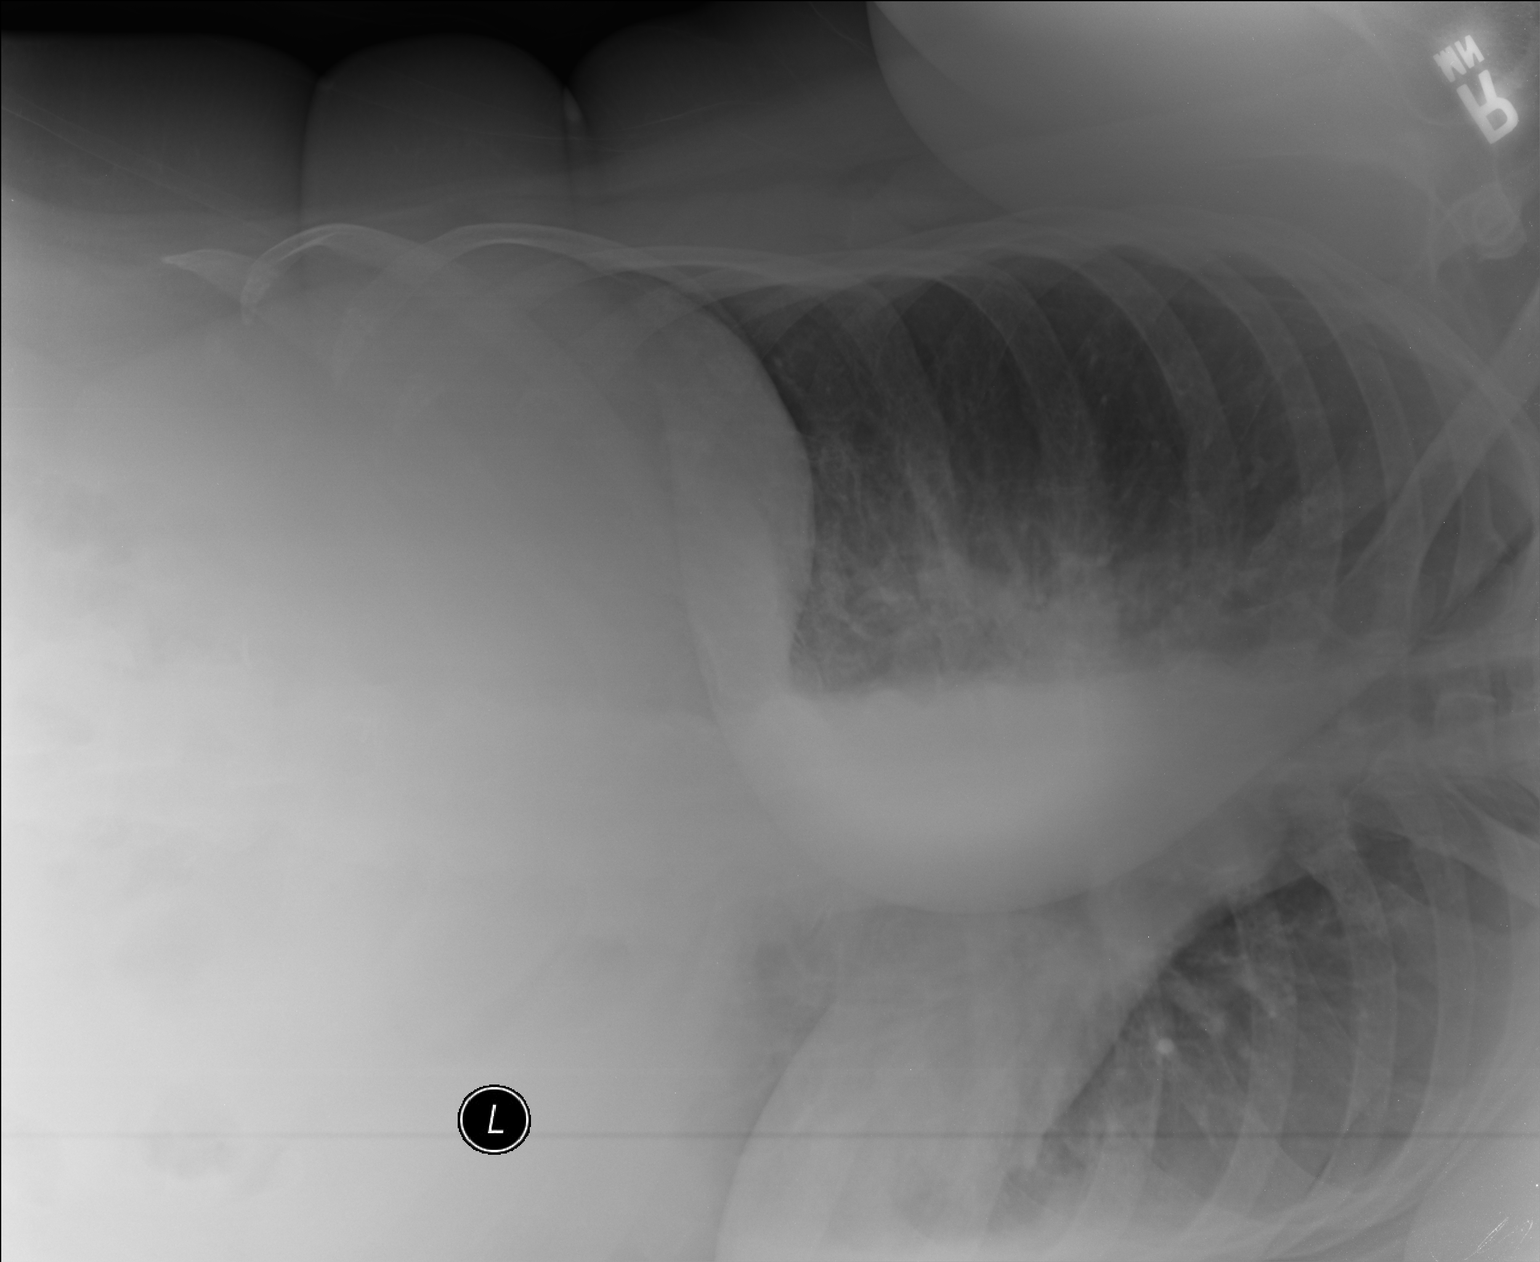

[AP]
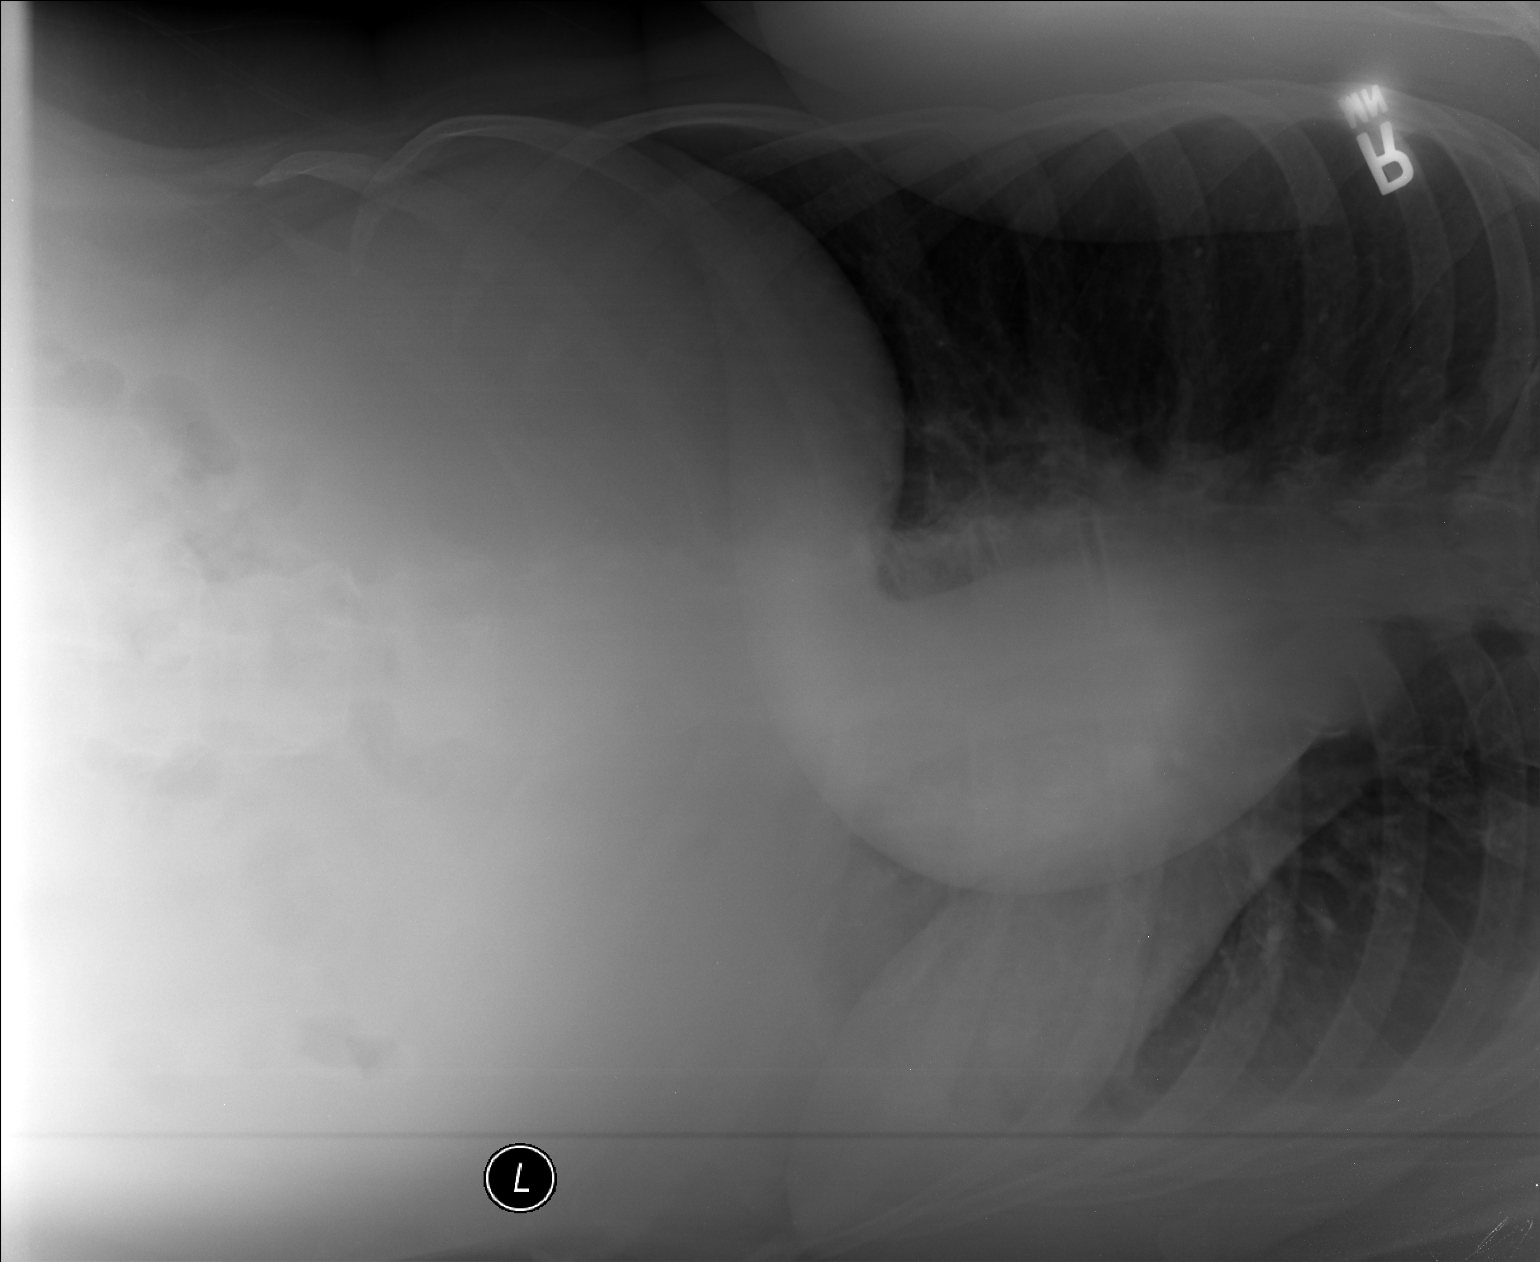

[2 of 2 positions shown; findings below may reference images not displayed]

FINDINGS: 2 right side up decubitus views are submitted. Clear right lung,
without evidence of pleural fluid or pneumothorax. Small layering
left pleural effusion. Grossly normal heart size. No free
intraperitoneal air.
IMPRESSION: Layering small left pleural effusion, similar to 08/22/2014.

## 2015-12-21 IMAGING — CT CT ABD-PELV W/ CM
2 of 6 series · 15 of 36 positions shown, 18 images · IV contrast (Omni 300)
Comparison: 08/02/2014

CLINICAL DATA: Follow-up pleural effusion

EXAM:
CT CHEST, ABDOMEN AND PELVIS WITHOUT CONTRAST
TECHNIQUE: Multidetector CT imaging of the chest, abdomen and pelvis was
performed following the standard protocol without IV contrast.

[Series 5: coronal · coronal · 0.82mm/px · 3 of 95 slices shown]
[im 19/95  lung]
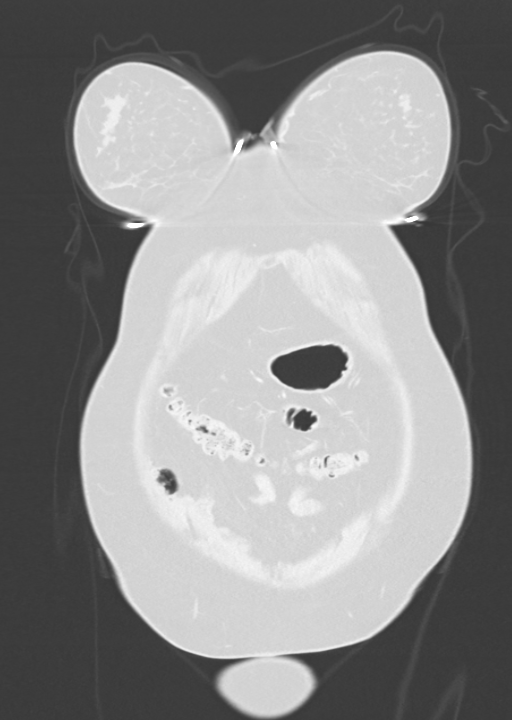
[im 38/95  lung]
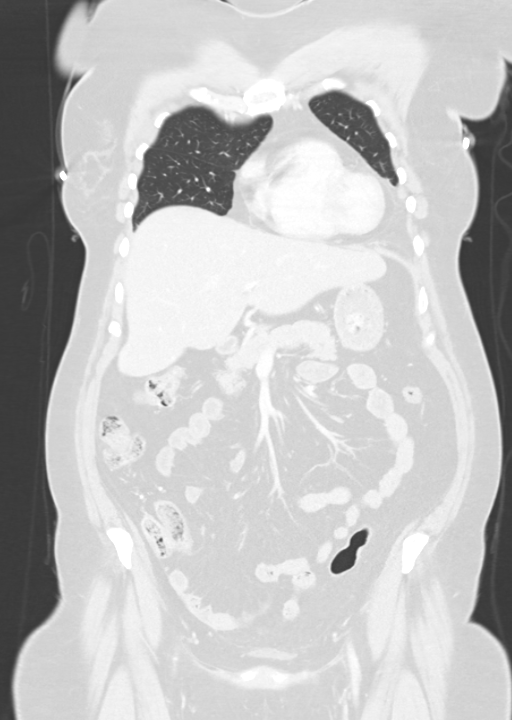
[im 57/95  lung]
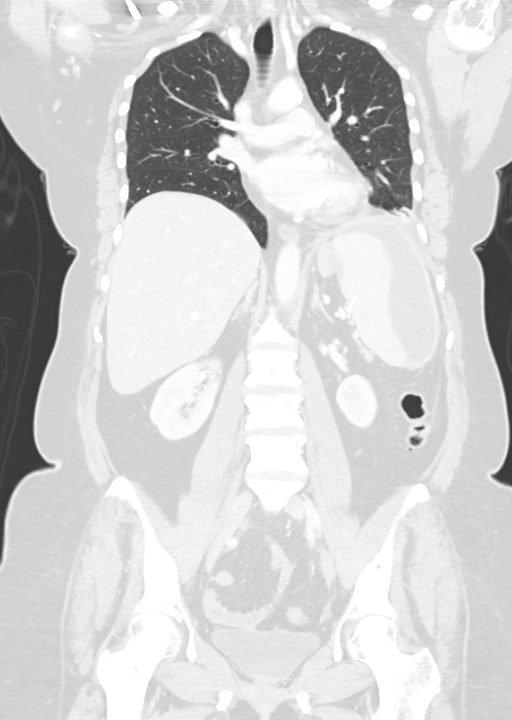

[Series 7: cap 5.0 i31f 1 · axial · 0.90mm/px · z∈[+962,+1468]mm · 12 of 119 slices shown, 15 images]
[im 9/119  mediastinal]
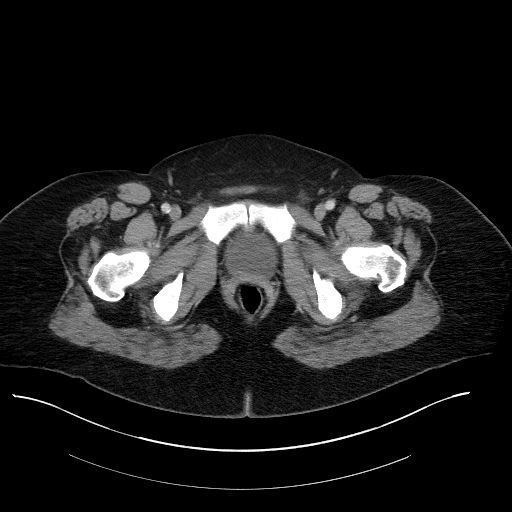
[im 9/119  lung]
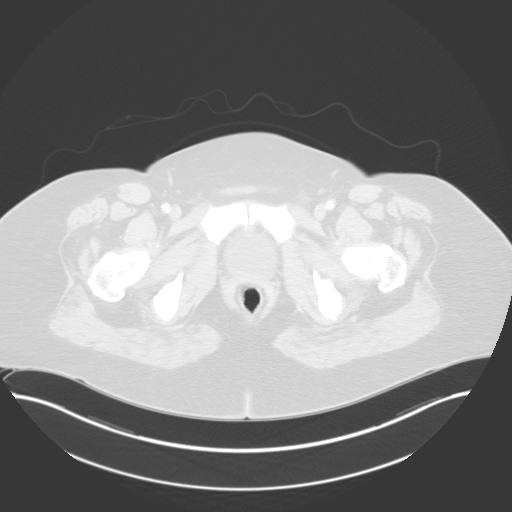
[im 17/119  lung]
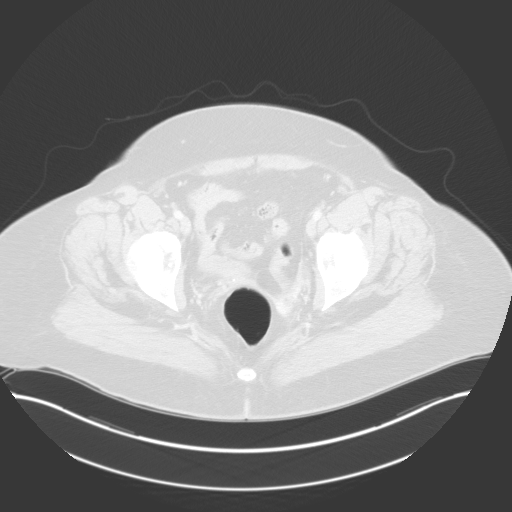
[im 26/119  lung]
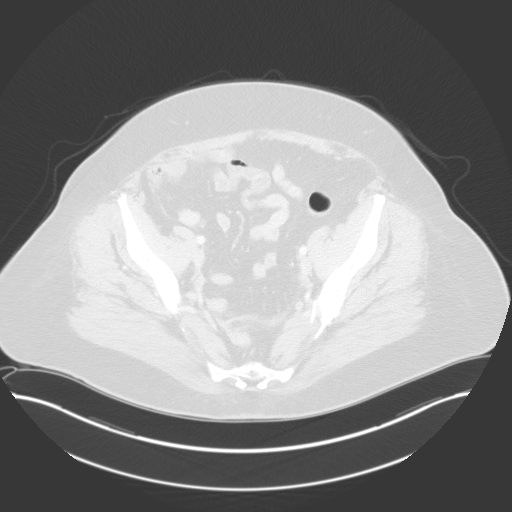
[im 34/119  lung]
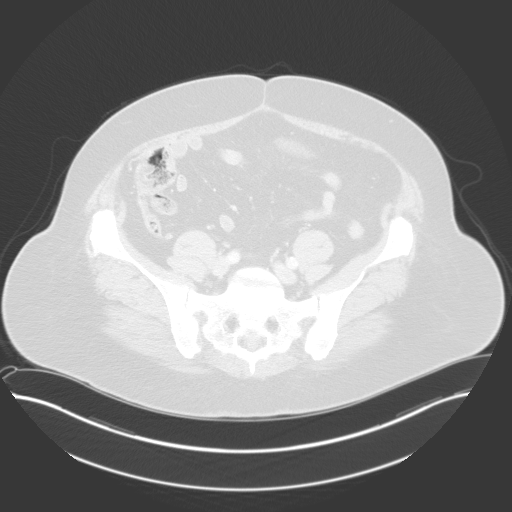
[im 43/119  mediastinal]
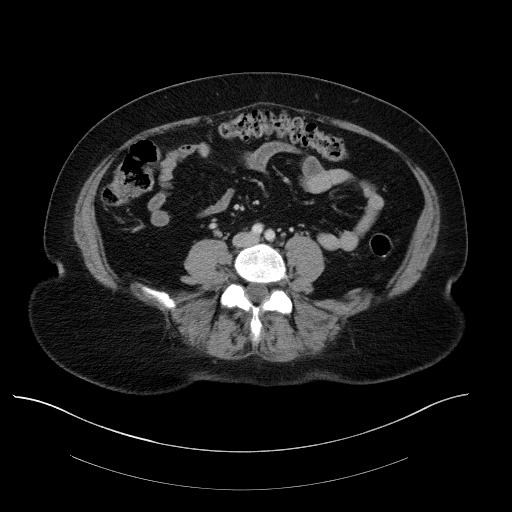
[im 43/119  lung]
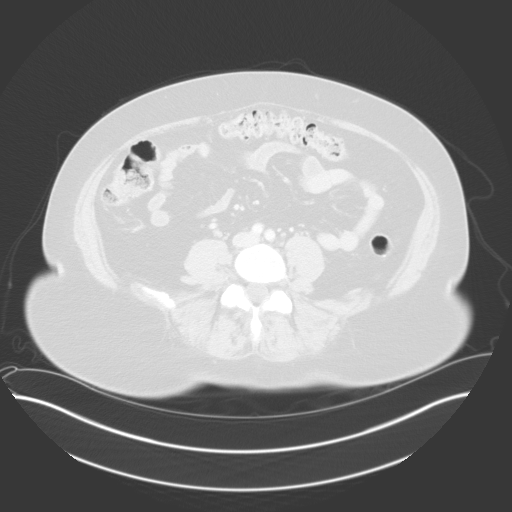
[im 51/119  lung]
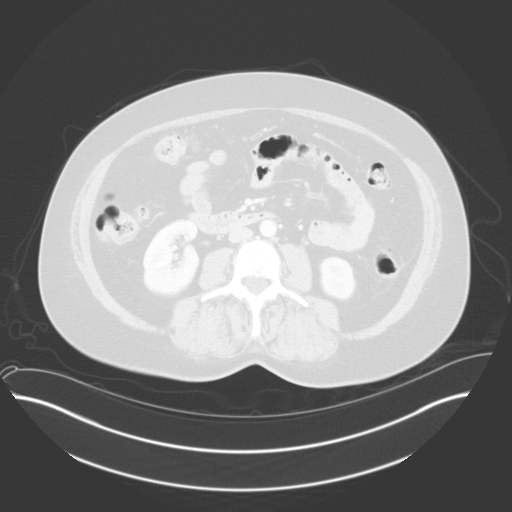
[im 68/119  lung]
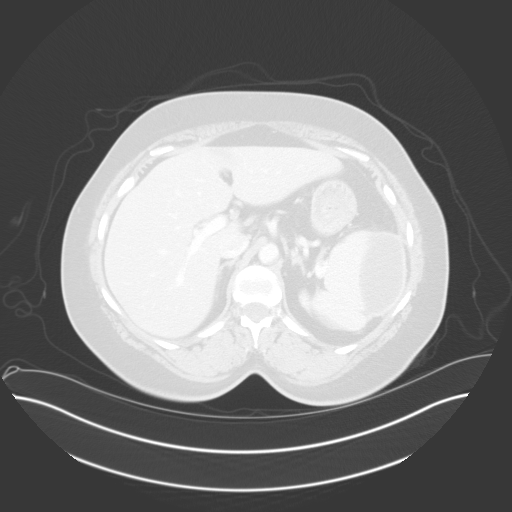
[im 76/119  lung]
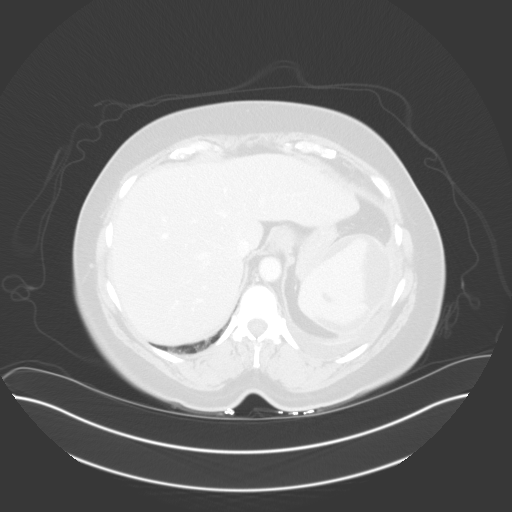
[im 85/119  mediastinal]
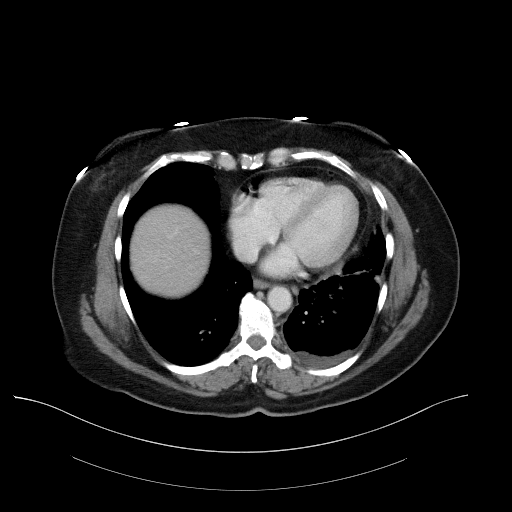
[im 85/119  lung]
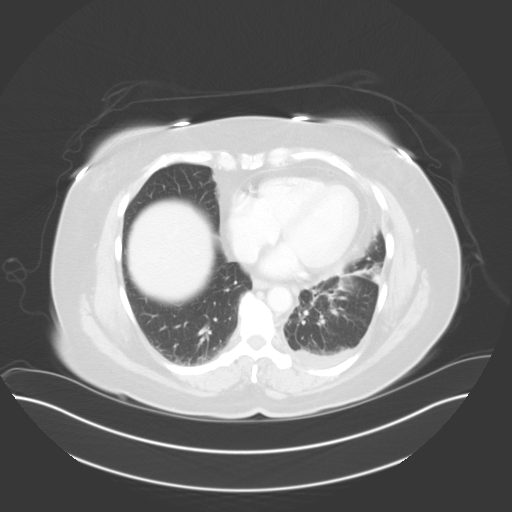
[im 93/119  lung]
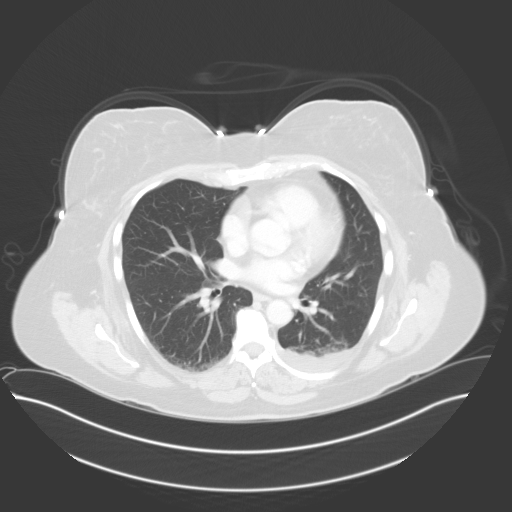
[im 102/119  lung]
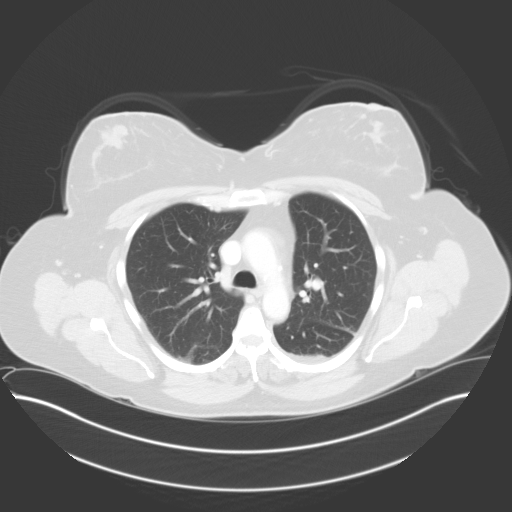
[im 110/119  lung]
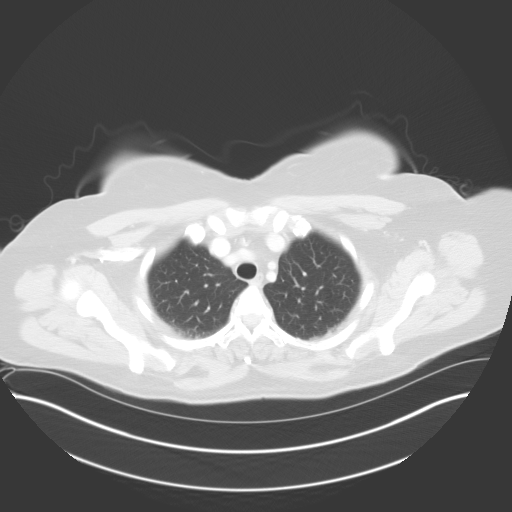

[15 of 36 positions shown; findings below may reference images not displayed]

FINDINGS: CT CHEST FINDINGS

Left pleural effusion has improved. Airspace opacities in the left
lower lobe and base of the lingula have improved. Minimal dependent
atelectasis in the right lung.

No pneumothorax

No abnormal mediastinal adenopathy.

CT ABDOMEN AND PELVIS FINDINGS

Fluid surrounding the spleen has subjectively improved and
diminished. It is also primarily low-density therefore there is no
evidence of recurrent hemorrhage. There is an enhancing rind
surrounding the fluid collection. There is no gas within the fluid
collection. This is most likely an evolving and diminishing
perisplenic hematoma. Spleen laceration is also evolving.

Diffuse hepatic steatosis

Postcholecystectomy

Pancreas and adrenal glands are within normal limits. Kidneys are
within normal limits

There is a small amount of hemoperitoneum in the pelvis which
persists. It is stable.
IMPRESSION: Left pleural effusion and associated airspace opacities have
improved.

Very splenic fluid collection is now all low-density and diminished
in size. There is an enhancing rind surrounding the fluid
collection.

Small amount of hemoperitoneum in the pelvis is stable.

## 2016-02-23 ENCOUNTER — Telehealth: Payer: Self-pay

## 2016-02-23 NOTE — Telephone Encounter (Signed)
Patient is calling to request a refill for Levothyroxine, lisinopril hctz, and metformin.  Patient phone: [phone]  [facility] on [address]

## 2016-02-24 ENCOUNTER — Other Ambulatory Visit: Payer: Self-pay | Admitting: *Deleted

## 2016-02-24 ENCOUNTER — Other Ambulatory Visit: Payer: Self-pay | Admitting: Emergency Medicine

## 2016-02-24 ENCOUNTER — Telehealth: Payer: Self-pay | Admitting: Emergency Medicine

## 2016-02-24 ENCOUNTER — Other Ambulatory Visit: Payer: Self-pay | Admitting: Family Medicine

## 2016-02-24 MED ORDER — LISINOPRIL-HYDROCHLOROTHIAZIDE 20-25 MG PO TABS: 1.0000 | ORAL_TABLET | Freq: Every day | ORAL | 0 refills | Status: DC

## 2016-02-24 MED ORDER — LEVOTHYROXINE SODIUM 137 MCG PO TABS: 137.0000 ug | ORAL_TABLET | Freq: Every day | ORAL | 0 refills | Status: DC

## 2016-02-24 MED ORDER — METFORMIN HCL 500 MG PO TABS: 500.0000 mg | ORAL_TABLET | Freq: Two times a day (BID) | ORAL | 0 refills | Status: DC

## 2016-02-24 NOTE — Telephone Encounter (Signed)
URGENT - out of medication, out of insurance needs BP MEDICATION NOW

## 2016-02-24 NOTE — Telephone Encounter (Signed)
Pt states, she will have medical insurance starting Oct.1 though [place]. Ran out of health maintenance medications RF Levothyroxine, Lisinopril-HCTZ, and Metformin and instructed her to schedule F/u with Dr. Brigitte Pulse Medications e-scribed to [facility] pharmacy

## 2016-03-07 NOTE — Progress Notes (Addendum)
Subjective:    Patient ID: Connie [name], female    DOB: 09/17/1960, 55 y.o.   MRN: [id number] Chief Complaint  Patient presents with  . Annual Exam    HPI  Connie [name] is a delightful 55 yo woman here today for her CPE.  I last saw her 9 mos prior.  SHe has NOT had a CPE prior in Epic She quit her job and has been travelig. First granddaughter Hollin in [place]. Has been having more lower back pains when she walks and she feels more tense. Does a reflexology monthly for a massage. Does  Her pinky toes goe numb  Will try yoga Going to derm for mole check  - K in [facility]  Primary preventative screens: Cervical Ca: Had total hysterectomy with cervix removed and BSO in 2008 - no malignancy Breast Ca: last done at [facility] 04/2013 CRS: Done 2016 Immunizations:  Pneumovax-23 and tdap 2014, flu shot annually OTC meds/supp/vit: Diet/Exercise: Vision/Dental:  Seen annually at [place]  At the health store that think she has 4 times to much toxin - poss from her liver and she has been getting a B supp Beta-cod, diabetic, detox supp Hopeing it will help 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 Eating 3x/d and doing better, some sweets.  Chronic medical conditions: DMII: Last a1c 9 mos prior was 6.7. Last saw optho Dr. Katy Fitch 07/2014. Nml urine microalb 05/2015. On metformin 500 bid. Hypothyroid: On levothyroxine 137 HTN: on lisinopril-hctz 20-25 Vitamin D def: 14  9 mos piror.  Past Medical History:  Diagnosis Date  . Allergic rhinitis   . Anxiety   . Arthritis   . Diabetes mellitus without complication ([place])   . Hyperlipidemia   . Hypertension   . Hypothyroid   . Lower extremity edema    Past Surgical History:  Procedure Laterality Date  . ABDOMINAL HYSTERECTOMY  2008  . CERVICAL SPINE SURGERY     Ruptured disc  . CESAREAN SECTION      . CHOLECYSTECTOMY    . JOINT REPLACEMENT    . OOPHORECTOMY  2008   Current Outpatient Prescriptions on File Prior to Visit  Medication Sig Dispense Refill  . acetaminophen (TYLENOL) 500 MG tablet Take 1,000 mg by mouth every 6 (six) hours as needed for moderate pain or headache.    . glucose blood test strip Use as instructed to check cbgs bid. 100 each 12  . ibuprofen (ADVIL,MOTRIN) 200 MG tablet Take 200 mg by mouth every 6 (six) hours as needed.    . Lancets MISC Check blood glucose twice a week. DX:250.00 100 each 0  . methocarbamol (ROBAXIN) 500 MG tablet Take 1-2 tablets (500-1,000 mg total) by mouth at bedtime as needed for muscle spasms. 45 tablet 3  . Clobetasol Propionate 0.05 % shampoo Apply 5-10 mL daily as needed to scalp (Patient not taking: Reported on 03/08/2016) 354 mL 8   No current facility-administered medications on file prior to visit.    Allergies  Allergen Reactions  . Niacin Other (See Comments)    Turns red from chest up and body gets hot.  . Other Other (See Comments)    IBS sx caused by celery, avocado, whole milk, almonds  . Codeine Other (See Comments)    Becomes incoherent  . Pravastatin Other (See Comments)    Joint pain  Family History  Problem Relation Age of Onset  . Stroke Mother 47  . Coronary artery disease Neg Hx     Premature or sudden cardiac death  . Diabetes Other     1st degree relative  . Hypertension Other   . Pancreatic cancer Other   . Stroke Other     Grandmother  . Heart disease Maternal Grandmother   . Diabetes Maternal Grandmother   . Heart disease Maternal Grandfather   . Cancer Paternal Grandmother    Social History   Social History  . Marital status: Divorced    Spouse name: N/A  . Number of children: 2  . Years of education: N/A   Occupational History  . Land   Social History Main Topics  . Smoking status: Never Smoker  . Smokeless tobacco: Never Used  . Alcohol use 0.0  oz/week  . Drug use: No  . Sexual activity: No   Other Topics Concern  . None   Social History Narrative   Marital status: divorced; not dating.       Children:  2 daughters (26, 51); no grandchildren.      Employment: [employer] supervisor      Tobacco; none since high school       Alcohol:  Weekends.   Does not exercise regularly            Review of Systems See hpi    Objective:   Physical Exam  Constitutional: She is oriented to person, place, and time. She appears well-developed and well-nourished. No distress.  HENT:  Head: Normocephalic and atraumatic.  Right Ear: Tympanic membrane, external ear and ear canal normal.  Left Ear: Tympanic membrane, external ear and ear canal normal.  Nose: Nose normal. No mucosal edema or rhinorrhea.  Mouth/Throat: Uvula is midline, oropharynx is clear and moist and mucous membranes are normal. No posterior oropharyngeal erythema.  Eyes: Conjunctivae and EOM are normal. Pupils are equal, round, and reactive to light. Right eye exhibits no discharge. Left eye exhibits no discharge. No scleral icterus.  Neck: Normal range of motion. Neck supple. No thyromegaly present.  Cardiovascular: Normal rate, regular rhythm, normal heart sounds and intact distal pulses.   Pulmonary/Chest: Effort normal and breath sounds normal. No respiratory distress.  Abdominal: Soft. Bowel sounds are normal. There is no tenderness.  Genitourinary: Rectal exam shows external hemorrhoid, internal hemorrhoid, fissure and tenderness.  Genitourinary Comments: Midline proximal bleeding anal fissure with a large non-thrombosed external hemorrhoid on each side. Multiple non-inflamed internal hemorrhoids.  Musculoskeletal: She exhibits no edema.  Lymphadenopathy:    She has no cervical adenopathy.  Neurological: She is alert and oriented to person, place, and time. She has normal reflexes.  Skin: Skin is warm and dry. She is not diaphoretic. No erythema.  Psychiatric: She has a  normal mood and affect. Her behavior is normal.     BP 116/84 (BP Location: Left Arm, Patient Position: Sitting, Cuff Size: Large)   Pulse 90   Temp 98.6 F (37 C) (Oral)   Resp 16   Wt 245 lb (111.1 kg)   SpO2 96%   BMI 38.37 kg/m   Results for orders placed or performed in visit on 03/08/16  CBC  Result Value Ref Range   WBC 8.0 3.8 - 10.8 K/uL   RBC 4.47 3.80 - 5.10 MIL/uL   Hemoglobin 12.8 11.7 - 15.5 g/dL   HCT 38.6 35.0 - 45.0 %   MCV 86.4  80.0 - 100.0 fL   MCH 28.6 27.0 - 33.0 pg   MCHC 33.2 32.0 - 36.0 g/dL   RDW 15.5 (H) 11.0 - 15.0 %   Platelets 285 140 - 400 K/uL   MPV 10.6 7.5 - 12.5 fL  Comprehensive metabolic panel  Result Value Ref Range   Sodium 137 135 - 146 mmol/L   Potassium 4.0 3.5 - 5.3 mmol/L   Chloride 101 98 - 110 mmol/L   CO2 24 20 - 31 mmol/L   Glucose, Bld 102 (H) 65 - 99 mg/dL   BUN 17 7 - 25 mg/dL   Creat 0.95 0.50 - 1.05 mg/dL   Total Bilirubin 0.7 0.2 - 1.2 mg/dL   Alkaline Phosphatase 47 33 - 130 U/L   AST 17 10 - 35 U/L   ALT 9 6 - 29 U/L   Total Protein 6.7 6.1 - 8.1 g/dL   Albumin 4.4 3.6 - 5.1 g/dL   Calcium 9.1 8.6 - 10.4 mg/dL  TSH  Result Value Ref Range   TSH 1.58 mIU/L  Lipid panel  Result Value Ref Range   Cholesterol 236 (H) 125 - 200 mg/dL   Triglycerides 301 (H) <150 mg/dL   HDL 33 (L) >=46 mg/dL   Total CHOL/HDL Ratio 7.2 (H) <=5.0 Ratio   VLDL 60 (H) <30 mg/dL   LDL Cholesterol 143 (H) <130 mg/dL  POCT glycosylated hemoglobin (Hb A1C)  Result Value Ref Range   Hemoglobin A1C 7.4   POCT urinalysis dipstick  Result Value Ref Range   Color, UA yellow yellow   Clarity, UA clear clear   Glucose, UA negative negative   Bilirubin, UA negative negative   Ketones, POC UA negative negative   Spec Grav, UA 1.010    Blood, UA negative negative   pH, UA 5.5    Protein Ur, POC negative negative   Urobilinogen, UA 0.2    Nitrite, UA Negative Negative   Leukocytes, UA Trace (A) Negative       Assessment & Plan:    Needs mammogram No pap due to total hys with bso for benign indication (cervcitis and fibroids) in 2008 Flu shot   Up to Dr. Ferdinand Lango GI - call if she can' see On 7/4 she had huge amount of swelling as she was out of her levothyorxine fr 3-4d  1. Annual physical exam   2. Encounter for screening mammogram for breast cancer   3. Screening for deficiency anemia   4. Essential hypertension   5. Hepatic steatosis   6. Hypothyroidism, unspecified type - stable, refilled levothyroxine 137  7. Other specified diabetes mellitus with diabetic polyneuropathy, without long-term current use of insulin (HCC) - DM worsening with a1c 6.9 -> 7.4 so increase metformin 500 bid to 1000 bid  8. Hyperlipidemia LDL goal <100  - tried pravastatin x 3 wks in 2014 and caused joint pain. No other hpl med trials. rec trial of very low dose of a different statin - cons atorvastatin - along with coenzyme q10  9. ANXIETY DEPRESSION   10. Screening for cardiovascular, respiratory, and genitourinary diseases   11. Vitamin D deficiency   12. External hemorrhoid   13. Fissure, anal  - stop the hydrocortisone hemorrhoidal trx, failed tucks pads, suppositories, prep H cream.  Needs freq sitz baths, increase fiber to keep stool soft but formed, and start intra-anal nitro gel to promote fissure healing. If sxs do not improve, may need to try to get pt back to her  prev GI doc - Dr. Ferdinand Lango (though pt may need to change offices as had an outstanding account).    Orders Placed This Encounter  Procedures  . Flu Vaccine QUAD 36+ mos IM  . CBC  . Comprehensive metabolic panel    Order Specific Question:   Has the patient fasted?    Answer:   Yes  . TSH  . Lipid panel    Order Specific Question:   Has the patient fasted?    Answer:   Yes  . POCT glycosylated hemoglobin (Hb A1C)  . POCT urinalysis dipstick    Meds ordered this encounter  Medications  . loratadine (CLARITIN) 10 MG tablet    Sig: Take 10 mg by mouth daily.   . Nitroglycerin 0.4 % OINT    Sig: Place 1 inch rectally every 12 (twelve) hours.    Dispense:  30 g    Refill:  0  . metFORMIN (GLUCOPHAGE) 1000 MG tablet    Sig: Take 1 tablet (1,000 mg total) by mouth 2 (two) times daily with a meal.    Dispense:  180 tablet    Refill:  3  . levothyroxine (SYNTHROID, LEVOTHROID) 137 MCG tablet    Sig: Take 1 tablet (137 mcg total) by mouth daily before breakfast.    Dispense:  90 tablet    Refill:  3  . lisinopril-hydrochlorothiazide (PRINZIDE,ZESTORETIC) 20-25 MG tablet    Sig: Take 1 tablet by mouth daily.    Dispense:  90 tablet    Refill:  3     [name], M.D.  [facility] [address] [place], [place] [address] [phone] phone [phone] fax  03/09/16 2:18 PM

## 2016-03-08 ENCOUNTER — Encounter: Payer: Self-pay | Admitting: Family Medicine

## 2016-03-08 ENCOUNTER — Ambulatory Visit (INDEPENDENT_AMBULATORY_CARE_PROVIDER_SITE_OTHER): Payer: BLUE CROSS/BLUE SHIELD | Admitting: Family Medicine

## 2016-03-08 ENCOUNTER — Telehealth: Payer: Self-pay

## 2016-03-08 VITALS — BP 116/84 | HR 90 | Temp 98.6°F | Resp 16 | Wt 245.0 lb

## 2016-03-08 DIAGNOSIS — Z0000 Encounter for general adult medical examination without abnormal findings: Secondary | ICD-10-CM | POA: Diagnosis not present

## 2016-03-08 DIAGNOSIS — E1342 Other specified diabetes mellitus with diabetic polyneuropathy: Secondary | ICD-10-CM | POA: Diagnosis not present

## 2016-03-08 DIAGNOSIS — K760 Fatty (change of) liver, not elsewhere classified: Secondary | ICD-10-CM

## 2016-03-08 DIAGNOSIS — Z1389 Encounter for screening for other disorder: Secondary | ICD-10-CM

## 2016-03-08 DIAGNOSIS — Z1383 Encounter for screening for respiratory disorder NEC: Secondary | ICD-10-CM

## 2016-03-08 DIAGNOSIS — Z1231 Encounter for screening mammogram for malignant neoplasm of breast: Secondary | ICD-10-CM | POA: Diagnosis not present

## 2016-03-08 DIAGNOSIS — K644 Residual hemorrhoidal skin tags: Secondary | ICD-10-CM

## 2016-03-08 DIAGNOSIS — E039 Hypothyroidism, unspecified: Secondary | ICD-10-CM | POA: Diagnosis not present

## 2016-03-08 DIAGNOSIS — Z136 Encounter for screening for cardiovascular disorders: Secondary | ICD-10-CM

## 2016-03-08 DIAGNOSIS — Z130 Encounter for screening for diseases of the blood and blood-forming organs and certain disorders involving the immune mechanism: Secondary | ICD-10-CM | POA: Diagnosis not present

## 2016-03-08 DIAGNOSIS — F341 Dysthymic disorder: Secondary | ICD-10-CM | POA: Diagnosis not present

## 2016-03-08 DIAGNOSIS — E785 Hyperlipidemia, unspecified: Secondary | ICD-10-CM

## 2016-03-08 DIAGNOSIS — I10 Essential (primary) hypertension: Secondary | ICD-10-CM

## 2016-03-08 DIAGNOSIS — E559 Vitamin D deficiency, unspecified: Secondary | ICD-10-CM

## 2016-03-08 DIAGNOSIS — K602 Anal fissure, unspecified: Secondary | ICD-10-CM

## 2016-03-08 LAB — COMPREHENSIVE METABOLIC PANEL
ALT: 9 U/L (ref 6–29)
AST: 17 U/L (ref 10–35)
Albumin: 4.4 g/dL (ref 3.6–5.1)
Alkaline Phosphatase: 47 U/L (ref 33–130)
BUN: 17 mg/dL (ref 7–25)
CO2: 24 mmol/L (ref 20–31)
Calcium: 9.1 mg/dL (ref 8.6–10.4)
Chloride: 101 mmol/L (ref 98–110)
Creat: 0.95 mg/dL (ref 0.50–1.05)
Glucose, Bld: 102 mg/dL — ABNORMAL HIGH (ref 65–99)
Potassium: 4 mmol/L (ref 3.5–5.3)
Sodium: 137 mmol/L (ref 135–146)
Total Bilirubin: 0.7 mg/dL (ref 0.2–1.2)
Total Protein: 6.7 g/dL (ref 6.1–8.1)

## 2016-03-08 LAB — POCT URINALYSIS DIP (MANUAL ENTRY)
Bilirubin, UA: NEGATIVE
Blood, UA: NEGATIVE
Glucose, UA: NEGATIVE
Ketones, POC UA: NEGATIVE
Nitrite, UA: NEGATIVE
Protein Ur, POC: NEGATIVE
Spec Grav, UA: 1.01
Urobilinogen, UA: 0.2
pH, UA: 5.5

## 2016-03-08 LAB — CBC
HCT: 38.6 % (ref 35.0–45.0)
Hemoglobin: 12.8 g/dL (ref 11.7–15.5)
MCH: 28.6 pg (ref 27.0–33.0)
MCHC: 33.2 g/dL (ref 32.0–36.0)
MCV: 86.4 fL (ref 80.0–100.0)
MPV: 10.6 fL (ref 7.5–12.5)
Platelets: 285 10*3/uL (ref 140–400)
RBC: 4.47 MIL/uL (ref 3.80–5.10)
RDW: 15.5 % — ABNORMAL HIGH (ref 11.0–15.0)
WBC: 8 10*3/uL (ref 3.8–10.8)

## 2016-03-08 LAB — LIPID PANEL
Cholesterol: 236 mg/dL — ABNORMAL HIGH (ref 125–200)
HDL: 33 mg/dL — ABNORMAL LOW (ref >=46)
LDL Cholesterol: 143 mg/dL — ABNORMAL HIGH (ref <130)
Total CHOL/HDL Ratio: 7.2 Ratio — ABNORMAL HIGH (ref <=5.0)
Triglycerides: 301 mg/dL — ABNORMAL HIGH (ref <150)
VLDL: 60 mg/dL — ABNORMAL HIGH (ref <30)

## 2016-03-08 LAB — TSH: TSH: 1.58 mIU/L

## 2016-03-08 LAB — POCT GLYCOSYLATED HEMOGLOBIN (HGB A1C): Hemoglobin A1C: 7.4

## 2016-03-08 MED ORDER — NITROGLYCERIN 0.4 % RE OINT: 1.0000 [in_us] | TOPICAL_OINTMENT | Freq: Two times a day (BID) | RECTAL | 0 refills | Status: DC

## 2016-03-08 NOTE — Telephone Encounter (Signed)
Patient is calling becuase the hemmorrhoid medication that was prescribed needs a prior authorization. Patient would either like a prior authorization or an alternative that the insurance will cover. Please advise!

## 2016-03-08 NOTE — Patient Instructions (Addendum)
Call to set up  Your mammogram.  Your vitamin D is to low - Make sure you are taking about 2000 to 5000u of vitamin D supplements daily.  Make sure you continue to take your levothyroxine on an EMPTY stomach - completely by itself with water.  You can take the other supplements/vitamins/meds as well as eat and drink 30 minutes later.   IF you received an x-ray today, you will receive an invoice from [facility]. Please contact [facility] at [phone] with questions or concerns regarding your invoice.   IF you received labwork today, you will receive an invoice from [employer]. Please contact [facility] at [phone] with questions or concerns regarding your invoice.   Our billing staff will not be able to assist you with questions regarding bills from these companies.  You will be contacted with the lab results as soon as they are available. The fastest way to get your results is to activate your My Chart account. Instructions are located on the last page of this paperwork. If you have not heard from [place] regarding the results in 2 weeks, please contact this office.     We recommend that you schedule a mammogram for breast cancer screening. Typically, you do not need a referral to do this. Please contact a local imaging center to schedule your mammogram.  [facility] - [phone]  *ask for the Radiology Department [facility] ([place]) - [phone] or [phone]  [facility] - [phone] [place] [phone] [facility] - [phone]  *ask for the [facility] - [phone]  *ask for the Radiology Department [facility] - [phone]  *ask for the Mammography Department [facility] - [phone]  Anal Fissure, Adult An anal fissure is a small tear or crack in the skin around the anus. Bleeding from  a fissure usually stops on its own within a few minutes. However, bleeding will often occur again with each bowel movement until the crack heals. CAUSES This condition may be caused by:  Passing large, hard stool (feces).  Frequent diarrhea.  Constipation.  Inflammatory bowel disease (Crohn disease or ulcerative colitis).  Infections.  Anal sex. SYMPTOMS Symptoms of this condition include:  Bleeding from the rectum.  Small amounts of blood seen on your stool, on toilet paper, or in the toilet after a bowel movement.  Painful bowel movements.  Itching or irritation around the anus. DIAGNOSIS A health care provider may diagnose this condition by closely examining the anal area. An anal fissure can usually be seen with careful inspection. In some cases, a rectal exam may be performed, or a short tube (anoscope) may be used to examine the anal canal. TREATMENT Treatment for this condition may include:  Taking steps to avoid constipation. This may include making changes to your diet, such as increasing your intake of fiber or fluid.  Taking fiber supplements. These supplements can soften your stool to help make bowel movements easier. Your health care provider may also prescribe a stool softener if your stool is often hard.  Taking sitz baths. This may help to heal the tear.  Using medicated creams or ointments. These may be prescribed to lessen discomfort. HOME CARE INSTRUCTIONS Eating and Drinking  Avoid foods that may be constipating, such as bananas and dairy products.  Drink enough fluid to keep your urine clear or pale yellow.  Maintain a diet that is  high in fruits, whole grains, and vegetables. General Instructions  Keep the anal area as clean and dry as possible.  Take sitz baths as told by your health care provider. Do not use soap in the sitz baths.  Take over-the-counter and prescription medicines only as told by your health care provider.  Use creams or  ointments only as told by your health care provider.  Keep all follow-up visits as told by your health care provider. This is important. SEEK MEDICAL CARE IF:  You have more bleeding.  You have a fever.  You have diarrhea that is mixed with blood.  You continue to have pain.  Your problem is getting worse rather than better.   This information is not intended to replace advice given to you by your health care provider. Make sure you discuss any questions you have with your health care provider.   Document Released: 05/14/2005 Document Revised: 02/02/2015 Document Reviewed: 08/09/2014 Elsevier Interactive Patient Education 2016 [employer].  Hemorrhoids Hemorrhoids are swollen veins around the rectum or anus. There are two types of hemorrhoids:   Internal hemorrhoids. These occur in the veins just inside the rectum. They may poke through to the outside and become irritated and painful.  External hemorrhoids. These occur in the veins outside the anus and can be felt as a painful swelling or hard lump near the anus. CAUSES  Pregnancy.   Obesity.   Constipation or diarrhea.   Straining to have a bowel movement.   Sitting for long periods on the toilet.  Heavy lifting or other activity that caused you to strain.  Anal intercourse. SYMPTOMS   Pain.   Anal itching or irritation.   Rectal bleeding.   Fecal leakage.   Anal swelling.   One or more lumps around the anus.  DIAGNOSIS  Your caregiver may be able to diagnose hemorrhoids by visual examination. Other examinations or tests that may be performed include:   Examination of the rectal area with a gloved hand (digital rectal exam).   Examination of anal canal using a small tube (scope).   A blood test if you have lost a significant amount of blood.  A test to look inside the colon (sigmoidoscopy or colonoscopy). TREATMENT Most hemorrhoids can be treated at home. However, if symptoms do not seem  to be getting better or if you have a lot of rectal bleeding, your caregiver may perform a procedure to help make the hemorrhoids get smaller or remove them completely. Possible treatments include:   Placing a rubber band at the base of the hemorrhoid to cut off the circulation (rubber band ligation).   Injecting a chemical to shrink the hemorrhoid (sclerotherapy).   Using a tool to burn the hemorrhoid (infrared light therapy).   Surgically removing the hemorrhoid (hemorrhoidectomy).   Stapling the hemorrhoid to block blood flow to the tissue (hemorrhoid stapling).  HOME CARE INSTRUCTIONS   Eat foods with fiber, such as whole grains, beans, nuts, fruits, and vegetables. Ask your doctor about taking products with added fiber in them (fibersupplements).  Increase fluid intake. Drink enough water and fluids to keep your urine clear or pale yellow.   Exercise regularly.   Go to the bathroom when you have the urge to have a bowel movement. Do not wait.   Avoid straining to have bowel movements.   Keep the anal area dry and clean. Use wet toilet paper or moist towelettes after a bowel movement.   Medicated creams and suppositories may be  used or applied as directed.   Only take over-the-counter or prescription medicines as directed by your caregiver.   Take warm sitz baths for 15-20 minutes, 3-4 times a day to ease pain and discomfort.   Place ice packs on the hemorrhoids if they are tender and swollen. Using ice packs between sitz baths may be helpful.   Put ice in a plastic bag.   Place a towel between your skin and the bag.   Leave the ice on for 15-20 minutes, 3-4 times a day.   Do not use a donut-shaped pillow or sit on the toilet for long periods. This increases blood pooling and pain.  SEEK MEDICAL CARE IF:  You have increasing pain and swelling that is not controlled by treatment or medicine.  You have uncontrolled bleeding.  You have difficulty or  you are unable to have a bowel movement.  You have pain or inflammation outside the area of the hemorrhoids. MAKE SURE YOU:  Understand these instructions.  Will watch your condition.  Will get help right away if you are not doing well or get worse.   This information is not intended to replace advice given to you by your health care provider. Make sure you discuss any questions you have with your health care provider.   Document Released: 05/11/2000 Document Revised: 04/30/2012 Document Reviewed: 03/18/2012 Elsevier Interactive Patient Education 2016 [employer]. About Hemorrhoids  Hemorrhoids are swollen veins in the lower rectum and anus.  Also called piles, hemorrhoids are a common problem.  Hemorrhoids may be internal (inside the rectum) or external (around the anus).  Internal Hemorrhoids  Internal hemorrhoids are often painless, but they rarely cause bleeding.  The internal veins may stretch and fall down (prolapse) through the anus to the outside of the body.  The veins may then become irritated and painful.  External Hemorrhoids  External hemorrhoids can be easily seen or felt around the anal opening.  They are under the skin around the anus.  When the swollen veins are scratched or broken by straining, rubbing or wiping they sometimes bleed.  How Hemorrhoids Occur  Veins in the rectum and around the anus tend to swell under pressure.  Hemorrhoids can result from increased pressure in the veins of your anus or rectum.  Some sources of pressure are:   Straining to have a bowel movement because of constipation  Waiting too long to have a bowel movement  Coughing and sneezing often  Sitting for extended periods of time, including on the toilet  Diarrhea  Obesity  Trauma or injury to the anus  Some liver diseases  Stress  Family history of hemorrhoids  Pregnancy  Pregnant women should try to avoid becoming constipated, because they are more likely to have  hemorrhoids during pregnancy.  In the last trimester of pregnancy, the enlarged uterus may press on blood vessels and causes hemorrhoids.  In addition, the strain of childbirth sometimes causes hemorrhoids after the birth.  Symptoms of Hemorrhoids  Some symptoms of hemorrhoids include:  Swelling and/or a tender lump around the anus  Itching, mild burning and bleeding around the anus  Painful bowel movements with or without constipation  Bright red blood covering the stool, on toilet paper or in the toilet bowel.   Symptoms usually go away within a few days.  Always talk to your doctor about any bleeding to make sure it is not from some other causes.  Diagnosing and Treating Hemorrhoids  Diagnosis is made by an  examination by your healthcare provider.  Special test can be performed by your doctor.    Most cases of hemorrhoids can be treated with:  High-fiber diet: Eat more high-fiber foods, which help prevent constipation.  Ask for more detailed fiber information on types and sources of fiber from your healthcare provider.  Fluids: Drink plenty of water.  This helps soften bowel movements so they are easier to pass.  Sitz baths and cold packs: Sitting in lukewarm water two or three times a day for 15 minutes cleases the anal area and may relieve discomfort.  If the water is too hot, swelling around the anus will get worse.  Placing a cloth-covered ice pack on the anus for ten minutes four times a day can also help reduce selling.  Gently pushing a prolapsed hemorrhoid back inside after the bath or ice pack can be helpful.  Medications: For mild discomfort, your healthcare provider may suggest over-the-counter pain medication or prescribe a cream or ointment for topical use.  The cream may contain witch hazel, zinc oxide or petroleum jelly.  Medicated suppositories are also a treatment option.  Always consult your doctor before applying medications or creams.  Procedures and surgeries:  There are also a number of procedures and surgeries to shrink or remove hemorrhoids in more serious cases.  Talk to your physician about these options.  You can often prevent hemorrhoids or keep them from becoming worse by maintaining a healthy lifestyle.  Eat a fiber-rich diet of fruits, vegetables and whole grains.  Also, drink plenty of water and exercise regularly.   2007, [employer] Doc.30  Nonsurgical Procedures for Hemorrhoids Nonsurgical procedures can be used to treat hemorrhoids. Hemorrhoids are swollen veins that are inside the rectum (internal hemorrhoids) or around the anus (external hemorrhoids). They are caused by increased pressure in the anal area. This pressure may result from straining to have a bowel movement (constipation), diarrhea, pregnancy, obesity, anal sex, or sitting for long periods of time. Hemorrhoids can cause symptoms such as pain and bleeding. Various procedures may be performed if diet changes, lifestyle changes, and other treatments do not help your symptoms. Some of these procedures do not involve surgery. Three common nonsurgical procedures are:  Rubber band ligation. Rubber bands are used to cut off the blood supply to the hemorrhoids.  Sclerotherapy. Medicine is injected into the hemorrhoids to shrink them.  Infrared coagulation. A type of light energy is used to get rid of the hemorrhoids. LET [facility] CARE PROVIDER KNOW ABOUT:  Any allergies you have.  All medicines you are taking, including vitamins, herbs, eye drops, creams, and over-the-counter medicines.  Previous problems you or members of your family have had with the use of anesthetics.  Any blood disorders you have.  Previous surgeries you have had.  Any medical conditions you have.  Whether you are pregnant or may be pregnant. RISKS AND COMPLICATIONS Generally, this is a safe procedure. However, problems may occur,  including:  Infection.  Bleeding.  Pain. BEFORE THE PROCEDURE  Ask your health care provider about:  Changing or stopping your regular medicines. This is especially important if you are taking diabetes medicines or blood thinners.  Taking medicines such as aspirin and ibuprofen. These medicines can thin your blood. Do not take these medicines before your procedure if your health care provider instructs you not to.  You may need to have a procedure to examine the inside of your colon with a scope (colonoscopy). Your health care provider may  do this to make sure that there are no other causes for your bleeding or pain. PROCEDURE  Your health care provider will clean your rectal area with a rinsing solution.  A lubricating jelly may be placed into your rectum. The jelly may contain a medicine to numb the area (local anesthetic).  Your health care provider will insert a short scope (anoscope) into your rectum to examine the hemorrhoids.  One of the following techniques will be used. Rubber Band Ligation Your health care provider will place medical instruments through the scope to put rubber bands around the base of your hemorrhoids. The bands will cut off the blood supply to the hemorrhoids. The hemorrhoids will fall off after several days. Sclerotherapy Your health care provider will inject medicine through the scope into your hemorrhoids. This will cause them to shrink and dry up. Infrared Coagulation Your health care provider will shine a type of light through the scope onto your hemorrhoids. This light will generate energy (infrared radiation). It will cause the hemorrhoids to scar and then fall off. Each of these procedures may vary among health care providers and hospitals. AFTER THE PROCEDURE  You will be monitored to make sure that you have no bleeding.  Return to your normal activities as told by your health care provider.   This information is not intended to replace  advice given to you by your health care provider. Make sure you discuss any questions you have with your health care provider.   Document Released: 03/11/2009 Document Revised: 02/02/2015 Document Reviewed: 08/09/2014 Elsevier Interactive Patient Education [employer].

## 2016-03-08 NOTE — Telephone Encounter (Signed)
Perhaps someone could help with the PA - she has a fissure and the rectal nitrogel is the only thing on the market for that so not sure what they need for the PA. . .. The only other option that I am aware of is compounded diltiazem which is usually more expensive since only at speciality pharmacies.

## 2016-03-09 MED ORDER — LEVOTHYROXINE SODIUM 137 MCG PO TABS: 137.0000 ug | ORAL_TABLET | Freq: Every day | ORAL | 3 refills | Status: DC

## 2016-03-09 MED ORDER — METFORMIN HCL 1000 MG PO TABS: 1000.0000 mg | ORAL_TABLET | Freq: Two times a day (BID) | ORAL | 3 refills | Status: DC

## 2016-03-09 MED ORDER — LISINOPRIL-HYDROCHLOROTHIAZIDE 20-25 MG PO TABS: 1.0000 | ORAL_TABLET | Freq: Every day | ORAL | 3 refills | Status: DC

## 2016-03-09 NOTE — Telephone Encounter (Signed)
PA done

## 2016-03-14 NOTE — Telephone Encounter (Signed)
The original PA was cancelled in covermymeds, but another one for the brand name was generated. Pharm confirmed PA is needed and stated this only comes in name brand. Completed the PA under Rectiv ointment.

## 2016-03-16 NOTE — Telephone Encounter (Signed)
PA was approved through 05/27/2038. Notified pharm and pt

## 2016-05-22 ENCOUNTER — Ambulatory Visit (INDEPENDENT_AMBULATORY_CARE_PROVIDER_SITE_OTHER): Payer: BLUE CROSS/BLUE SHIELD | Admitting: Urgent Care

## 2016-05-22 VITALS — BP 110/70 | HR 77 | Temp 97.6°F | Resp 16 | Ht 67.0 in | Wt 243.0 lb

## 2016-05-22 DIAGNOSIS — J069 Acute upper respiratory infection, unspecified: Secondary | ICD-10-CM

## 2016-05-22 DIAGNOSIS — R195 Other fecal abnormalities: Secondary | ICD-10-CM

## 2016-05-22 DIAGNOSIS — R52 Pain, unspecified: Secondary | ICD-10-CM | POA: Diagnosis not present

## 2016-05-22 DIAGNOSIS — R0981 Nasal congestion: Secondary | ICD-10-CM | POA: Diagnosis not present

## 2016-05-22 DIAGNOSIS — B9789 Other viral agents as the cause of diseases classified elsewhere: Secondary | ICD-10-CM

## 2016-05-22 LAB — POCT CBC
Granulocyte percent: 65.7 %G (ref 37–80)
HCT, POC: 37.3 % — AB (ref 37.7–47.9)
Hemoglobin: 12.8 g/dL (ref 12.2–16.2)
Lymph, poc: 2.5 (ref 0.6–3.4)
MCH, POC: 29.6 pg (ref 27–31.2)
MCHC: 34.3 g/dL (ref 31.8–35.4)
MCV: 86.2 fL (ref 80–97)
MID (cbc): 0.5 (ref 0–0.9)
MPV: 8.8 fL (ref 0–99.8)
POC Granulocyte: 5.8 (ref 2–6.9)
POC LYMPH PERCENT: 28.4 %L (ref 10–50)
POC MID %: 5.9 %M (ref 0–12)
Platelet Count, POC: 233 10*3/uL (ref 142–424)
RBC: 4.33 M/uL (ref 4.04–5.48)
RDW, POC: 15.4 %
WBC: 8.8 10*3/uL (ref 4.6–10.2)

## 2016-05-22 MED ORDER — HYDROCODONE-HOMATROPINE 5-1.5 MG/5ML PO SYRP: 5.0000 mL | ORAL_SOLUTION | Freq: Every evening | ORAL | 0 refills | Status: DC | PRN

## 2016-05-22 MED ORDER — PSEUDOEPHEDRINE HCL ER 120 MG PO TB12: 120.0000 mg | ORAL_TABLET | Freq: Two times a day (BID) | ORAL | 3 refills | Status: DC

## 2016-05-22 MED ORDER — BENZONATATE 100 MG PO CAPS: 100.0000 mg | ORAL_CAPSULE | Freq: Three times a day (TID) | ORAL | 0 refills | Status: DC | PRN

## 2016-05-22 NOTE — Patient Instructions (Addendum)
Upper Respiratory Infection, Adult Most upper respiratory infections (URIs) are a viral infection of the air passages leading to the lungs. A URI affects the nose, throat, and upper air passages. The most common type of URI is nasopharyngitis and is typically referred to as "the common cold." URIs run their course and usually go away on their own. Most of the time, a URI does not require medical attention, but sometimes a bacterial infection in the upper airways can follow a viral infection. This is called a secondary infection. Sinus and middle ear infections are common types of secondary upper respiratory infections. Bacterial pneumonia can also complicate a URI. A URI can worsen asthma and chronic obstructive pulmonary disease (COPD). Sometimes, these complications can require emergency medical care and may be life threatening. What are the causes? Almost all URIs are caused by viruses. A virus is a type of germ and can spread from one person to another. What increases the risk? You may be at risk for a URI if:  You smoke.  You have chronic heart or lung disease.  You have a weakened defense (immune) system.  You are very young or very old.  You have nasal allergies or asthma.  You work in crowded or poorly ventilated areas.  You work in health care facilities or schools.  What are the signs or symptoms? Symptoms typically develop 2-3 days after you come in contact with a cold virus. Most viral URIs last 7-10 days. However, viral URIs from the influenza virus (flu virus) can last 14-18 days and are typically more severe. Symptoms may include:  Runny or stuffy (congested) nose.  Sneezing.  Cough.  Sore throat.  Headache.  Fatigue.  Fever.  Loss of appetite.  Pain in your forehead, behind your eyes, and over your cheekbones (sinus pain).  Muscle aches.  How is this diagnosed? Your health care provider may diagnose a URI by:  Physical exam.  Tests to check that your  symptoms are not due to another condition such as: ? Strep throat. ? Sinusitis. ? Pneumonia. ? Asthma.  How is this treated? A URI goes away on its own with time. It cannot be cured with medicines, but medicines may be prescribed or recommended to relieve symptoms. Medicines may help:  Reduce your fever.  Reduce your cough.  Relieve nasal congestion.  Follow these instructions at home:  Take medicines only as directed by your health care provider.  Gargle warm saltwater or take cough drops to comfort your throat as directed by your health care provider.  Use a warm mist humidifier or inhale steam from a shower to increase air moisture. This may make it easier to breathe.  Drink enough fluid to keep your urine clear or pale yellow.  Eat soups and other clear broths and maintain good nutrition.  Rest as needed.  Return to work when your temperature has returned to normal or as your health care provider advises. You may need to stay home longer to avoid infecting others. You can also use a face mask and careful hand washing to prevent spread of the virus.  Increase the usage of your inhaler if you have asthma.  Do not use any tobacco products, including cigarettes, chewing tobacco, or electronic cigarettes. If you need help quitting, ask your health care provider. How is this prevented? The best way to protect yourself from getting a cold is to practice good hygiene.  Avoid oral or hand contact with people with cold symptoms.  Wash your   hands often if contact occurs.  There is no clear evidence that vitamin C, vitamin E, echinacea, or exercise reduces the chance of developing a cold. However, it is always recommended to get plenty of rest, exercise, and practice good nutrition. Contact a health care provider if:  You are getting worse rather than better.  Your symptoms are not controlled by medicine.  You have chills.  You have worsening shortness of breath.  You have  brown or red mucus.  You have yellow or brown nasal discharge.  You have pain in your face, especially when you bend forward.  You have a fever.  You have swollen neck glands.  You have pain while swallowing.  You have white areas in the back of your throat. Get help right away if:  You have severe or persistent: ? Headache. ? Ear pain. ? Sinus pain. ? Chest pain.  You have chronic lung disease and any of the following: ? Wheezing. ? Prolonged cough. ? Coughing up blood. ? A change in your usual mucus.  You have a stiff neck.  You have changes in your: ? Vision. ? Hearing. ? Thinking. ? Mood. This information is not intended to replace advice given to you by your health care provider. Make sure you discuss any questions you have with your health care provider. Document Released: 11/07/2000 Document Revised: 01/15/2016 Document Reviewed: 08/19/2013 Elsevier Interactive Patient Education  2017 Elsevier Inc.     IF you received an x-ray today, you will receive an invoice from [facility]. Please contact [facility] at [phone] with questions or concerns regarding your invoice.   IF you received labwork today, you will receive an invoice from [facility]. Please contact [facility] at [phone] with questions or concerns regarding your invoice.   Our billing staff will not be able to assist you with questions regarding bills from these companies.  You will be contacted with the lab results as soon as they are available. The fastest way to get your results is to activate your My Chart account. Instructions are located on the last page of this paperwork. If you have not heard from us regarding the results in 2 weeks, please contact this office.     

## 2016-05-22 NOTE — Progress Notes (Signed)
    MRN: [id number] DOB: [birth year]  Subjective:   Connie [name] is a 55 y.o. female presenting for chief complaint of Diarrhea (x3 times today) and Generalized Body Aches (not feeling very well; feeling tired)  Reports 2 day history of 3 loose stools, subjective fever, chills, body aches, mild headaches, slight non-productive cough, sinus congestion and pressure, upset stomach. Has tried apples, orange juice. Has tried otc loratadine with some relief. Has had flu shot 02/2016. Denies history of asthma, denies smoking cigarettes.  Connie [name] has a current medication list which includes the following prescription(s): acetaminophen, clobetasol propionate, glucose blood, ibuprofen, lancets, levothyroxine, lisinopril-hydrochlorothiazide, loratadine, metformin, methocarbamol, and nitroglycerin. Also is allergic to niacin; other; codeine; and pravastatin.  Connie [name]  has a past medical history of Allergic rhinitis; Anxiety; Arthritis; Diabetes mellitus without complication ([place]); Hyperlipidemia; Hypertension; Hypothyroid; and Lower extremity edema. Also  has a past surgical history that includes Cholecystectomy; Cervical spine surgery; Abdominal hysterectomy (2008); Oophorectomy (2008); Joint replacement; and Cesarean section.  Objective:   Vitals: BP 110/70   Pulse 77   Temp 97.6 F (36.4 C) (Oral)   Resp 16   Ht 5\' 7"  (1.702 m)   Wt 243 lb (110.2 kg)   SpO2 99%   BMI 38.06 kg/m   Physical Exam  Constitutional: She is oriented to person, place, and time. She appears well-developed and well-nourished.  HENT:  TM's intact bilaterally, no effusions or erythema. Nasal turbinates pink and moist, nasal passages patent. No sinus tenderness. Oropharynx clear, mucous membranes moist, dentition in good repair.  Eyes: Right eye exhibits no discharge. Left eye exhibits no discharge.  Neck: Normal range of motion. Neck supple.  Cardiovascular: Normal rate, regular rhythm and intact distal pulses.  Exam  reveals no gallop and no friction rub.   No murmur heard. Pulmonary/Chest: No respiratory distress. She has no wheezes. She has no rales.  Lymphadenopathy:    She has no cervical adenopathy.  Neurological: She is alert and oriented to person, place, and time.  Skin: Skin is warm and dry.   Results for orders placed or performed in visit on 05/22/16 (from the past 24 hour(s))  POCT CBC     Status: Abnormal   Collection Time: 05/22/16  5:45 PM  Result Value Ref Range   WBC 8.8 4.6 - 10.2 K/uL   Lymph, poc 2.5 0.6 - 3.4   POC LYMPH PERCENT 28.4 10 - 50 %L   MID (cbc) 0.5 0 - 0.9   POC MID % 5.9 0 - 12 %M   POC Granulocyte 5.8 2 - 6.9   Granulocyte percent 65.7 37 - 80 %G   RBC 4.33 4.04 - 5.48 M/uL   Hemoglobin 12.8 12.2 - 16.2 g/dL   HCT, POC 37.3 (A) 37.7 - 47.9 %   MCV 86.2 80 - 97 fL   MCH, POC 29.6 27 - 31.2 pg   MCHC 34.3 31.8 - 35.4 g/dL   RDW, POC 15.4 %   Platelet Count, POC 233 142 - 424 K/uL   MPV 8.8 0 - 99.8 fL    Assessment and Plan :   1. Viral URI with cough 2. Body aches 3. Loose stools 4. Sinus congestion - Likely viral in nature, advised supportive care. - If no improvement or symptoms do not resolve return to clinic in 1 week   [name], PA-C [facility] [phone] 05/22/2016 5:23 PM

## 2016-07-03 ENCOUNTER — Ambulatory Visit (INDEPENDENT_AMBULATORY_CARE_PROVIDER_SITE_OTHER): Payer: BLUE CROSS/BLUE SHIELD | Admitting: Emergency Medicine

## 2016-07-03 VITALS — BP 100/70 | HR 81 | Temp 97.9°F | Resp 17 | Ht 67.0 in | Wt 238.0 lb

## 2016-07-03 DIAGNOSIS — B9789 Other viral agents as the cause of diseases classified elsewhere: Secondary | ICD-10-CM | POA: Diagnosis not present

## 2016-07-03 DIAGNOSIS — J069 Acute upper respiratory infection, unspecified: Secondary | ICD-10-CM | POA: Diagnosis not present

## 2016-07-03 DIAGNOSIS — R197 Diarrhea, unspecified: Secondary | ICD-10-CM

## 2016-07-03 DIAGNOSIS — J029 Acute pharyngitis, unspecified: Secondary | ICD-10-CM | POA: Diagnosis not present

## 2016-07-03 NOTE — Patient Instructions (Addendum)
   IF you received an x-ray today, you will receive an invoice from [facility]. Please contact [facility] at [phone] with questions or concerns regarding your invoice.   IF you received labwork today, you will receive an invoice from [facility]. Please contact [facility] at [phone] with questions or concerns regarding your invoice.   Our billing staff will not be able to assist you with questions regarding bills from these companies.  You will be contacted with the lab results as soon as they are available. The fastest way to get your results is to activate your My Chart account. Instructions are located on the last page of this paperwork. If you have not heard from us regarding the results in 2 weeks, please contact this office.     Viral Illness, Adult Viruses are tiny germs that can get into a person's body and cause illness. There are many different types of viruses, and they cause many types of illness. Viral illnesses can range from mild to severe. They can affect various parts of the body. Common illnesses that are caused by a virus include colds and the flu. Viral illnesses also include serious conditions such as HIV/AIDS (human immunodeficiency virus/acquired immunodeficiency syndrome). A few viruses have been linked to certain cancers. What are the causes? Many types of viruses can cause illness. Viruses invade cells in your body, multiply, and cause the infected cells to malfunction or die. When the cell dies, it releases more of the virus. When this happens, you develop symptoms of the illness, and the virus continues to spread to other cells. If the virus takes over the function of the cell, it can cause the cell to divide and grow out of control, as is the case when a virus causes cancer. Different viruses get into the body in different ways. You can get a virus by:  Swallowing food or water that is contaminated with the virus.  Breathing in droplets  that have been coughed or sneezed into the air by an infected person.  Touching a surface that has been contaminated with the virus and then touching your eyes, nose, or mouth.  Being bitten by an insect or animal that carries the virus.  Having sexual contact with a person who is infected with the virus.  Being exposed to blood or fluids that contain the virus, either through an open cut or during a transfusion. If a virus enters your body, your body's defense system (immune system) will try to fight the virus. You may be at higher risk for a viral illness if your immune system is weak. What are the signs or symptoms? Symptoms vary depending on the type of virus and the location of the cells that it invades. Common symptoms of the main types of viral illnesses include: Cold and flu viruses   Fever.  Headache.  Sore throat.  Muscle aches.  Nasal congestion.  Cough. Digestive system (gastrointestinal) viruses   Fever.  Abdominal pain.  Nausea.  Diarrhea. Liver viruses (hepatitis)   Loss of appetite.  Tiredness.  Yellowing of the skin (jaundice). Brain and spinal cord viruses   Fever.  Headache.  Stiff neck.  Nausea and vomiting.  Confusion or sleepiness. Skin viruses   Warts.  Itching.  Rash. Sexually transmitted viruses   Discharge.  Swelling.  Redness.  Rash. How is this treated? Viruses can be difficult to treat because they live within cells. Antibiotic medicines do not treat viruses because these drugs do not get inside cells.   Treatment for a viral illness may include:  Resting and drinking plenty of fluids.  Medicines to relieve symptoms. These can include over-the-counter medicine for pain and fever, medicines for cough or congestion, and medicines to relieve diarrhea.  Antiviral medicines. These drugs are available only for certain types of viruses. They may help reduce flu symptoms if taken early. There are also many antiviral  medicines for hepatitis and HIV/AIDS. Some viral illnesses can be prevented with vaccinations. A common example is the flu shot. Follow these instructions at home: Medicines    Take over-the-counter and prescription medicines only as told by your health care provider.  If you were prescribed an antiviral medicine, take it as told by your health care provider. Do not stop taking the medicine even if you start to feel better.  Be aware of when antibiotics are needed and when they are not needed. Antibiotics do not treat viruses. If your health care provider thinks that you may have a bacterial infection as well as a viral infection, you may get an antibiotic.  Do not ask for an antibiotic prescription if you have been diagnosed with a viral illness. That will not make your illness go away faster.  Frequently taking antibiotics when they are not needed can lead to antibiotic resistance. When this develops, the medicine no longer works against the bacteria that it normally fights. General instructions   Drink enough fluids to keep your urine clear or pale yellow.  Rest as much as possible.  Return to your normal activities as told by your health care provider. Ask your health care provider what activities are safe for you.  Keep all follow-up visits as told by your health care provider. This is important. How is this prevented? Take these actions to reduce your risk of viral infection:  Eat a healthy diet and get enough rest.  Wash your hands often with soap and water. This is especially important when you are in public places. If soap and water are not available, use hand sanitizer.  Avoid close contact with friends and family who have a viral illness.  If you travel to areas where viral gastrointestinal infection is common, avoid drinking water or eating raw food.  Keep your immunizations up to date. Get a flu shot every year as told by your health care provider.  Do not share  toothbrushes, nail clippers, razors, or needles with other people.  Always practice safe sex. Contact a health care provider if:  You have symptoms of a viral illness that do not go away.  Your symptoms come back after going away.  Your symptoms get worse. Get help right away if:  You have trouble breathing.  You have a severe headache or a stiff neck.  You have severe vomiting or abdominal pain. This information is not intended to replace advice given to you by your health care provider. Make sure you discuss any questions you have with your health care provider. Document Released: 09/23/2015 Document Revised: 10/26/2015 Document Reviewed: 09/23/2015 Elsevier Interactive Patient Education  2017 Elsevier Inc.   
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
## 2016-08-01 ENCOUNTER — Ambulatory Visit (INDEPENDENT_AMBULATORY_CARE_PROVIDER_SITE_OTHER): Payer: BLUE CROSS/BLUE SHIELD | Admitting: Emergency Medicine

## 2016-08-01 VITALS — BP 116/76 | HR 91 | Temp 98.5°F | Resp 17 | Ht 66.5 in | Wt 236.0 lb

## 2016-08-01 DIAGNOSIS — R21 Rash and other nonspecific skin eruption: Secondary | ICD-10-CM | POA: Diagnosis not present

## 2016-08-01 DIAGNOSIS — L03115 Cellulitis of right lower limb: Secondary | ICD-10-CM

## 2016-08-01 MED ORDER — TRIAMCINOLONE ACETONIDE 0.1 % EX CREA: 1.0000 "application " | TOPICAL_CREAM | Freq: Two times a day (BID) | CUTANEOUS | 0 refills | Status: AC

## 2016-08-01 MED ORDER — CEFADROXIL 500 MG PO CAPS: 500.0000 mg | ORAL_CAPSULE | Freq: Two times a day (BID) | ORAL | 1 refills | Status: AC
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
## 2016-08-01 NOTE — Patient Instructions (Addendum)
IF you received an x-ray today, you will receive an invoice from [facility]. Please contact [facility] at [phone] with questions or concerns regarding your invoice.   IF you received labwork today, you will receive an invoice from [facility]. Please contact [facility] at [phone] with questions or concerns regarding your invoice.   Our billing staff will not be able to assist you with questions regarding bills from these companies.  You will be contacted with the lab results as soon as they are available. The fastest way to get your results is to activate your My Chart account. Instructions are located on the last page of this paperwork. If you have not heard from [place] regarding the results in 2 weeks, please contact this office.      Rash A rash is a change in the color of the skin. A rash can also change the way your skin feels. There are many different conditions and factors that can cause a rash. Follow these instructions at home: Pay attention to any changes in your symptoms. Follow these instructions to help with your condition: Medicine  Take or apply over-the-counter and prescription medicines only as told by your doctor. These may include:  Corticosteroid cream.  Anti-itch lotions.  Oral antihistamines. Skin Care   Put cool compresses on the affected areas.  Try taking a bath with:  Epsom salts. Follow the instructions on the packaging. You can get these at your local pharmacy or grocery store.  Baking soda. Pour a small amount into the bath as told by your doctor.  Colloidal oatmeal. Follow the instructions on the packaging. You can get this at your local pharmacy or grocery store.  Try putting baking soda paste onto your skin. Stir water into baking soda until it gets like a paste.  Do not scratch or rub your skin.  Avoid covering the rash. Make sure the rash is exposed to air as much as possible. General instructions   Avoid hot  showers or baths, which can make itching worse. A cold shower may help.  Avoid scented soaps, detergents, and perfumes. Use gentle soaps, detergents, perfumes, and other cosmetic products.  Avoid anything that causes your rash. Keep a journal to help track what causes your rash. Write down:  What you eat.  What cosmetic products you use.  What you drink.  What you wear. This includes jewelry.  Keep all follow-up visits as told by your doctor. This is important. Contact a doctor if:  You sweat at night.  You lose weight.  You pee (urinate) more than normal.  You feel weak.  You throw up (vomit).  Your skin or the whites of your eyes look yellow (jaundice).  Your skin:  Tingles.  Is numb.  Your rash:  Does not go away after a few days.  Gets worse.  You are:  More thirsty than normal.  More tired than normal.  You have:  New symptoms.  Pain in your belly (abdomen).  A fever.  Watery poop (diarrhea). Get help right away if:  Your rash covers all or most of your body. The rash may or may not be painful.  You have blisters that:  Are on top of the rash.  Grow larger.  Grow together.  Are painful.  Are inside your nose or mouth.  You have a rash that:  Looks like purple pinprick-sized spots all over your body.  Has a "bull's eye" or looks like a target.  Is red  and painful, causes your skin to peel, and is not from being in the sun too long. This information is not intended to replace advice given to you by your health care provider. Make sure you discuss any questions you have with your health care provider. Document Released: 10/31/2007 Document Revised: 10/20/2015 Document Reviewed: 09/29/2014 Elsevier Interactive Patient Education  2017 [employer].

## 2016-09-05 NOTE — Progress Notes (Signed)
Subjective:    Patient ID: Connie [name], female    DOB: [birth year], 56 y.o.   MRN: [id number] Chief Complaint  Patient presents with  . Follow-up    6 months  . Medication Refill    methocarbamol    HPI  Connie [name] a 56 yo woman here for a 6 mo follow-up for her chronic medical conditions.  I last saw her 6 mos prior for her CPE.  She is looking for a job   Chronic medical conditions: DMII: Prior a1cs 6.7 -> 7.4 -> 6.9 today but .  Not checking cbgs.  The tingling and numbness in her small toes is improving but more shooting pains in her legs at night which she is attributed to sleeping in the couch. She is willing to increase her exercise over the summer. Last saw optho Dr. Katy Fitch 07/2014. Nml urine microalb 05/2015. HLD: Lipids 6 mos prior LDL 143, non-HDL 203 on no meds. Tried pravastatin x 3 wks in 2014 and caused joint pain. No other hpl med trials. rec trial of very low dose of a different statin - cons atorvastatin - along with coenzyme q10 though she had not yet been willing to try. Hypothyroid: On levothyroxine 137 HTN: on lisinopril-hctz 20-25. Not checking BP outside of the office Vitamin D def: 14  9 mos piror. Psoriasis: lifelong and worsening, over ears and eyes. Sees dermatology Dr. Nehemiah Massed in [place].  She is no longer taking variety of supplements as didn't like taking a lot of pills.. Using vinegar supp and probiotics every day.   Working? Traveling?  First Granddaughter Hollin in [place]. Takes reassurence in her faith.  Past Medical History:  Diagnosis Date  . Allergic rhinitis   . Anxiety   . Arthritis   . Diabetes mellitus without complication ([place])   . Hyperlipidemia   . Hypertension   . Hypothyroid   . Lower extremity edema    Past Surgical History:  Procedure Laterality Date  . ABDOMINAL HYSTERECTOMY  2008  . CERVICAL SPINE SURGERY     Ruptured disc  . CESAREAN SECTION    . CHOLECYSTECTOMY    . JOINT REPLACEMENT    . OOPHORECTOMY  2008    Current Outpatient Prescriptions on File Prior to Visit  Medication Sig Dispense Refill  . acetaminophen (TYLENOL) 500 MG tablet Take 1,000 mg by mouth every 6 (six) hours as needed for moderate pain or headache.    . glucose blood test strip Use as instructed to check cbgs bid. 100 each 12  . Lancets MISC Check blood glucose twice a week. DX:250.00 100 each 0  . levothyroxine (SYNTHROID, LEVOTHROID) 137 MCG tablet Take 1 tablet (137 mcg total) by mouth daily before breakfast. 90 tablet 3  . lisinopril-hydrochlorothiazide (PRINZIDE,ZESTORETIC) 20-25 MG tablet Take 1 tablet by mouth daily. 90 tablet 3  . triamcinolone cream (KENALOG) 0.1 % Apply 1 application topically 2 (two) times daily. 30 g 0  . Clobetasol Propionate 0.05 % shampoo Apply 5-10 mL daily as needed to scalp (Patient not taking: Reported on 09/06/2016) 354 mL 8  . ibuprofen (ADVIL,MOTRIN) 200 MG tablet Take 200 mg by mouth every 6 (six) hours as needed.    . loratadine (CLARITIN) 10 MG tablet Take 10 mg by mouth daily.     No current facility-administered medications on file prior to visit.    Allergies  Allergen Reactions  . Niacin Other (See Comments)    Turns red from chest up and body gets hot.  Marland Kitchen  Other Other (See Comments)    IBS sx caused by celery, avocado, whole milk, almonds  . Almond (Diagnostic) Itching  . Codeine Other (See Comments)    Becomes incoherent  . Pravastatin Other (See Comments)    Joint pain    Family History  Problem Relation Age of Onset  . Stroke Mother 58  . Heart disease Maternal Grandmother   . Diabetes Maternal Grandmother   . Heart disease Maternal Grandfather   . Cancer Paternal Grandmother   . Diabetes Other     1st degree relative  . Hypertension Other   . Pancreatic cancer Other   . Stroke Other     Grandmother  . Coronary artery disease Neg Hx     Premature or sudden cardiac death   Social History   Social History  . Marital status: Divorced    Spouse name: N/A  .  Number of children: 2  . Years of education: N/A   Occupational History  . Land   Social History Main Topics  . Smoking status: Never Smoker  . Smokeless tobacco: Never Used  . Alcohol use 0.0 oz/week  . Drug use: No  . Sexual activity: No   Other Topics Concern  . None   Social History Narrative   Marital status: divorced; not dating.       Children:  2 daughters (26, 34); no grandchildren.      Employment: AR supervisor      Tobacco; none since high school       Alcohol:  Weekends.   Does not exercise regularly         Depression screen [facility] 2/9 09/06/2016 08/01/2016 07/03/2016 05/22/2016 03/08/2016  Decreased Interest 0 0 0 0 0  Down, Depressed, Hopeless 0 0 0 0 1  PHQ - 2 Score 0 0 0 0 1    Review of Systems See hpi    Objective:   Physical Exam  Constitutional: She is oriented to person, place, and time. She appears well-developed and well-nourished. No distress.  HENT:  Head: Normocephalic and atraumatic.  Right Ear: External ear normal.  Left Ear: External ear normal.  Eyes: Conjunctivae are normal. No scleral icterus.  Neck: Normal range of motion. Neck supple. No thyromegaly present.  Cardiovascular: Normal rate, regular rhythm, normal heart sounds and intact distal pulses.   Pulmonary/Chest: Effort normal and breath sounds normal. No respiratory distress.  Musculoskeletal: She exhibits no edema.  Lymphadenopathy:    She has no cervical adenopathy.  Neurological: She is alert and oriented to person, place, and time.  Skin: Skin is warm and dry. She is not diaphoretic. No erythema.  Psychiatric: She has a normal mood and affect. Her behavior is normal.    Diabetic Foot Exam - Simple   Simple Foot Form Diabetic Foot exam was performed with the following findings:  Yes 09/06/2016  8:11 AM  Visual Inspection No deformities, no ulcerations, no other skin breakdown bilaterally:  Yes Sensation Testing Intact to touch and monofilament  testing bilaterally:  Yes Pulse Check Posterior Tibialis and Dorsalis pulse intact bilaterally:  Yes Comments Pt has some swelling in ankles.    BP 134/85   Pulse 83   Temp 98.2 F (36.8 C) (Oral)   Resp 16   Ht 5\' 6"  (1.676 m)   Wt 236 lb (107 kg)   SpO2 97%   BMI 38.09 kg/m      Assessment & Plan:  f/u 6 mos  for CPE. poct a1c, cmp, tsh, urine microalb, lipids if fasting Foot exam today  1. Type 2 diabetes mellitus with diabetic polyneuropathy, without long-term current use of insulin (HCC) - pt has only been using metformin 500 mg qam despite prior instructions to increase but she now agree to do so as a1c 6.9 today and want to keep < 6.5.  2. Hypothyroidism, unspecified type - stable, cont levothyroxine 137  3. Essential hypertension - cont lisinopril-hctz 20-25  4. Hepatic steatosis   5. Hyperlipidemia LDL goal <100 - start statin as LDL 150 and non-HDL 215. Joint pain with pravastatin so try low dose atorvastatin with coenzyme Q10  6. Medication monitoring encounter   7. Neck muscle spasm   8. Loss of lateral visual fields - f/u with optho asap - pt agrees.  9.       Low back pain, functional - prn robaxin  Orders Placed This Encounter  Procedures  . Comprehensive metabolic panel    Order Specific Question:   Has the patient fasted?    Answer:   Yes  . TSH  . Microalbumin/Creatinine Ratio, Urine  . Lipid panel    Order Specific Question:   Has the patient fasted?    Answer:   Yes  . Ambulatory referral to Ophthalmology    Referral Priority:   Routine    Referral Type:   Consultation    Referral Reason:   Specialty Services Required    Requested Specialty:   Ophthalmology    Number of Visits Requested:   1  . POCT glycosylated hemoglobin (Hb A1C)  . HM DIABETES FOOT EXAM    Meds ordered this encounter  Medications  . Multiple Vitamins-Minerals (MULTIVITAMIN WITH MINERALS) tablet    Sig: Take 1 tablet by mouth daily.  . methocarbamol (ROBAXIN) 500 MG  tablet    Sig: Take 1-2 tablets (500-1,000 mg total) by mouth at bedtime as needed for muscle spasms.    Dispense:  180 tablet    Refill:  1  . metFORMIN (GLUCOPHAGE) 500 MG tablet    Sig: Take 1 tablet (500 mg total) by mouth 2 (two) times daily with a meal. Then increase monthly as instructed up to 2 tabs bid.    Dispense:  180 tablet    Refill:  0     [name], M.D.  Primary Care at [facility] [address] [place], [place] [address] [phone] phone [phone] fax  09/08/16 9:40 PM  Results for orders placed or performed in visit on 09/06/16  Comprehensive metabolic panel  Result Value Ref Range   Glucose 109 (H) 65 - 99 mg/dL   BUN 23 6 - 24 mg/dL   Creatinine, Ser 1.19 (H) 0.57 - 1.00 mg/dL   GFR calc non Af Amer 52 (L) >59 mL/min/1.73   GFR calc Af Amer 59 (L) >59 mL/min/1.73   BUN/Creatinine Ratio 19 9 - 23   Sodium 135 134 - 144 mmol/L   Potassium 5.0 3.5 - 5.2 mmol/L   Chloride 96 96 - 106 mmol/L   CO2 27 18 - 29 mmol/L   Calcium 10.0 8.7 - 10.2 mg/dL   Total Protein 7.0 6.0 - 8.5 g/dL   Albumin 4.5 3.5 - 5.5 g/dL   Globulin, Total 2.5 1.5 - 4.5 g/dL   Albumin/Globulin Ratio 1.8 1.2 - 2.2   Bilirubin Total 0.5 0.0 - 1.2 mg/dL   Alkaline Phosphatase 53 39 - 117 IU/L   AST 16 0 -  40 IU/L   ALT 8 0 - 32 IU/L  TSH  Result Value Ref Range   TSH 0.459 0.450 - 4.500 uIU/mL  Microalbumin/Creatinine Ratio, Urine  Result Value Ref Range   Creatinine, Urine 79.9 Not Estab. mg/dL   Albumin, Urine <3.0 Not Estab. ug/mL   Microalb/Creat Ratio <3.8 0.0 - 30.0 mg/g creat  Lipid panel  Result Value Ref Range   Cholesterol, Total 248 (H) 100 - 199 mg/dL   Triglycerides 324 (H) 0 - 149 mg/dL   HDL 33 (L) >39 mg/dL   VLDL Cholesterol Cal 65 (H) 5 - 40 mg/dL   LDL Calculated 150 (H) 0 - 99 mg/dL   Chol/HDL Ratio 7.5 (H) 0.0 - 4.4 ratio  POCT glycosylated hemoglobin (Hb A1C)  Result Value Ref Range   Hemoglobin A1C 6.9

## 2016-09-06 ENCOUNTER — Ambulatory Visit (INDEPENDENT_AMBULATORY_CARE_PROVIDER_SITE_OTHER): Payer: BLUE CROSS/BLUE SHIELD | Admitting: Family Medicine

## 2016-09-06 ENCOUNTER — Encounter: Payer: Self-pay | Admitting: Family Medicine

## 2016-09-06 VITALS — BP 134/85 | HR 83 | Temp 98.2°F | Resp 16 | Ht 66.0 in | Wt 236.0 lb

## 2016-09-06 DIAGNOSIS — M62838 Other muscle spasm: Secondary | ICD-10-CM

## 2016-09-06 DIAGNOSIS — I10 Essential (primary) hypertension: Secondary | ICD-10-CM

## 2016-09-06 DIAGNOSIS — E1142 Type 2 diabetes mellitus with diabetic polyneuropathy: Secondary | ICD-10-CM

## 2016-09-06 DIAGNOSIS — Z5181 Encounter for therapeutic drug level monitoring: Secondary | ICD-10-CM | POA: Diagnosis not present

## 2016-09-06 DIAGNOSIS — K760 Fatty (change of) liver, not elsewhere classified: Secondary | ICD-10-CM | POA: Diagnosis not present

## 2016-09-06 DIAGNOSIS — E039 Hypothyroidism, unspecified: Secondary | ICD-10-CM | POA: Diagnosis not present

## 2016-09-06 DIAGNOSIS — E785 Hyperlipidemia, unspecified: Secondary | ICD-10-CM

## 2016-09-06 DIAGNOSIS — H547 Unspecified visual loss: Secondary | ICD-10-CM

## 2016-09-06 DIAGNOSIS — H5310 Unspecified subjective visual disturbances: Secondary | ICD-10-CM

## 2016-09-06 LAB — POCT GLYCOSYLATED HEMOGLOBIN (HGB A1C): Hemoglobin A1C: 6.9

## 2016-09-06 MED ORDER — METHOCARBAMOL 500 MG PO TABS: 500.0000 mg | ORAL_TABLET | Freq: Every evening | ORAL | 1 refills | Status: DC | PRN

## 2016-09-06 MED ORDER — METFORMIN HCL 500 MG PO TABS: 500.0000 mg | ORAL_TABLET | Freq: Two times a day (BID) | ORAL | 0 refills | Status: DC

## 2016-09-06 NOTE — Patient Instructions (Addendum)
We recommend that you schedule a mammogram for breast cancer screening. Typically, you do not need a referral to do this. Please contact a local imaging center to schedule your mammogram.  [facility] - [phone]  *ask for the Radiology Department [facility] ([place]) - [phone] or [phone]  [facility] - [phone] [place] [phone] [facility] - [phone]  *ask for the [facility] - [phone]  *ask for the Radiology Department [facility] - [phone]  *ask for the [place] - [phone]    IF you received an x-ray today, you will receive an invoice from [facility] Radiology. Please contact [facility] Radiology at [phone] with questions or concerns regarding your invoice.   IF you received labwork today, you will receive an invoice from [facility]. Please contact [facility] at [phone] with questions or concerns regarding your invoice.   Our billing staff will not be able to assist you with questions regarding bills from these companies.  You will be contacted with the lab results as soon as they are available. The fastest way to get your results is to activate your My Chart account. Instructions are located on the last page of this paperwork. If you have not heard from [place] regarding the results in 2 weeks, please contact this office.     How to Avoid Diabetes Mellitus Problems You can take action to prevent or slow down problems that are caused by diabetes (diabetes mellitus). Following your diabetes plan and taking care of yourself can reduce your risk of serious or life-threatening complications. Manage your diabetes  Follow instructions from your health care providers about managing your diabetes. Your diabetes may be managed by a team of health care providers who can teach you how to care for  yourself and can answer questions that you have.  Educate yourself about your condition so you can make healthy choices about eating and physical activity.  Check your blood sugar (glucose) levels as often as directed. Your health care provider will help you decide how often to check your blood glucose level depending on your treatment goals and how well you are meeting them.  Ask your health care provider if you should take low-dose aspirin daily and what dose is recommended for you. Taking low-dose aspirin daily is recommended to help prevent cardiovascular disease. Do not use nicotine or tobacco Do not use any products that contain nicotine or tobacco, such as cigarettes and e-cigarettes. If you need help quitting, ask your health care provider. Nicotine raises your risk for diabetes problems. If you quit using nicotine:  You will lower your risk for heart attack, stroke, nerve disease, and kidney disease.  Your cholesterol and blood pressure may improve.  Your blood circulation will improve. Keep your blood pressure under control To control your blood pressure:  Follow instructions from your health care provider about meal planning, exercise, and medicines.  Make sure your health care provider checks your blood pressure at every medical visit. A blood pressure reading consists of two numbers. Generally, the goal is to keep your top number (systolic pressure) at or below 130, and your bottom number (diastolic pressure) at or below 80. Your health care provider may recommend a lower target blood pressure. Your individualized target blood pressure is determined based on:  Your age.  Your medicines.  How long you have had diabetes.  Any other medical conditions you have.  Keep your cholesterol under control To control your cholesterol:  Follow instructions from your health care provider about meal planning, exercise, and medicines.  Have your cholesterol checked at least once a  year.  You may be prescribed medicine to lower cholesterol (statin). If you are not taking a statin, ask your health care provider if you should be. Controlling your cholesterol may:  Help prevent heart disease and stroke. These are the most common health problems for people with diabetes.  Improve your blood flow. Schedule and keep yearly physical exams and eye exams Your health care provider will tell you how often you need medical visits depending on your diabetes management plan. Keep all follow-up visits as directed. This is important so possible problems can be identified early and complications can be avoided or treated.  Every visit with your health care provider should include measuring your:  Weight.  Blood pressure.  Blood glucose control.  Your A1c (hemoglobin A1c) level should be checked:  At least 2 times a year, if you are meeting your treatment goals.  4 times a year, if you are not meeting treatment goals or if your treatment goals have changed.  Your blood lipids (lipid profile) should be checked yearly. You should also be checked yearly for protein in your urine (urine microalbumin).  If you have type 1 diabetes, get an eye exam 3-5 years after you are diagnosed, and then once a year after your first exam.  If you have type 2 diabetes, get an eye exam as soon as you are diagnosed, and then once a year after your first exam. Keep your vaccines current It is recommended that you receive:  A flu (influenza) vaccine every year.  A pneumonia (pneumococcal) vaccine and a hepatitis B vaccine. If you are age 75 or older, you may get the pneumonia vaccine as a series of two separate shots. Ask your health care provider which other vaccines may be recommended. Take care of your feet Diabetes may cause you to have poor blood circulation to your legs and feet. Because of this, taking care of your feet is very important. Diabetes can cause:  The skin on the feet to get  thinner, break more easily, and heal more slowly.  Nerve damage in your legs and feet, which results in decreased feeling. You may not notice minor injuries that could lead to serious problems. To avoid foot problems:  Check your skin and feet every day for cuts, bruises, redness, blisters, or sores.  Schedule a foot exam with your health care provider once every year. This exam includes:  Inspecting of the structure and skin of your feet.  Checking the pulses and sensation in your feet.  Make sure that your health care provider performs a visual foot exam at every medical visit. Take care of your teeth People with poorly controlled diabetes are more likely to have gum (periodontal) disease. Diabetes can make periodontal diseases harder to control. If not treated, periodontal diseases can lead to tooth loss. To prevent this:  Brush your teeth twice a day.  Floss at least once a day.  Visit your dentist 2 times a year. Drink responsibly Limit alcohol intake to no more than 1 drink a day for nonpregnant women and 2 drinks a day for men. One drink equals 12 oz of beer, 5 oz of wine, or 1 oz of hard liquor. It is important to eat food when you drink alcohol to avoid low blood glucose (hypoglycemia). Avoid alcohol if  you:  Have a history of alcohol abuse or dependence.  Are pregnant.  Have liver disease, pancreatitis, advanced neuropathy, or severe hypertriglyceridemia. Lessen stress Living with diabetes can be stressful. When you are experiencing stress, your blood glucose may be affected in two ways:  Stress hormones may cause your blood glucose to rise.  You may be distracted from taking good care of yourself. Be aware of your stress level and make changes to help you manage challenging situations. To lower your stress levels:  Consider joining a support group.  Do planned relaxation or meditation.  Do a hobby that you enjoy.  Maintain healthy relationships.  Exercise  regularly.  Work with your health care provider or a mental health professional. Summary  You can take action to prevent or slow down problems that are caused by diabetes (diabetes mellitus). Following your diabetes plan and taking care of yourself can reduce your risk of serious or life-threatening complications.  Follow instructions from your health care providers about managing your diabetes. Your diabetes may be managed by a team of health care providers who can teach you how to care for yourself and can answer questions that you have.  Your health care provider will tell you how often you need medical visits depending on your diabetes management plan. Keep all follow-up visits as directed. This is important so possible problems can be identified early and complications can be avoided or treated. This information is not intended to replace advice given to you by your health care provider. Make sure you discuss any questions you have with your health care provider. Document Released: 01/30/2011 Document Revised: 02/11/2016 Document Reviewed: 02/11/2016 Elsevier Interactive Patient Education  2017 [employer]  Peripheral Neuropathy Peripheral neuropathy is a type of nerve damage. It affects nerves that carry signals between the spinal cord and other parts of the body. These are called peripheral nerves. With peripheral neuropathy, one nerve or a group of nerves may be damaged. What are the causes? Many things can damage peripheral nerves. For some people with peripheral neuropathy, the cause is unknown. Some causes include:  Diabetes. This is the most common cause of peripheral neuropathy.  Injury to a nerve.  Pressure or stress on a nerve that lasts a long time.  Too little vitamin B. Alcoholism can lead to this.  Infections.  Autoimmune diseases, such as multiple sclerosis and systemic lupus erythematosus.  Inherited nerve diseases.  Some medicines, such as cancer  drugs.  Toxic substances, such as lead and mercury.  Too little blood flowing to the legs.  Kidney disease.  Thyroid disease. What are the signs or symptoms? Different people have different symptoms. The symptoms you have will depend on which of your nerves is damaged. Common symptoms include:  Loss of feeling (numbness) in the feet and hands.  Tingling in the feet and hands.  Pain that burns.  Very sensitive skin.  Weakness.  Not being able to move a part of the body (paralysis).  Muscle twitching.  Clumsiness or poor coordination.  Loss of balance.  Not being able to control your bladder.  Feeling dizzy.  Sexual problems. How is this diagnosed? Peripheral neuropathy is a symptom, not a disease. Finding the cause of peripheral neuropathy can be hard. To figure that out, your health care provider will take a medical history and do a physical exam. A neurological exam will also be done. This involves checking things affected by your brain, spinal cord, and nerves (nervous system). For example, your health  care provider will check your reflexes, how you move, and what you can feel. Other types of tests may also be ordered, such as:  Blood tests.  A test of the fluid in your spinal cord.  Imaging tests, such as CT scans or an MRI.  Electromyography (EMG). This test checks the nerves that control muscles.  Nerve conduction velocity tests. These tests check how fast messages pass through your nerves.  Nerve biopsy. A small piece of nerve is removed. It is then checked under a microscope. How is this treated?  Medicine is often used to treat peripheral neuropathy. Medicines may include:  Pain-relieving medicines. Prescription or over-the-counter medicine may be suggested.  Antiseizure medicine. This may be used for pain.  Antidepressants. These also may help ease pain from neuropathy.  Lidocaine. This is a numbing medicine. You might wear a patch or be given a  shot.  Mexiletine. This medicine is typically used to help control irregular heart rhythms.  Surgery. Surgery may be needed to relieve pressure on a nerve or to destroy a nerve that is causing pain.  Physical therapy to help movement.  Assistive devices to help movement. Follow these instructions at home:  Only take over-the-counter or prescription medicines as directed by your health care provider. Follow the instructions carefully for any given medicines. Do not take any other medicines without first getting approval from your health care provider.  If you have diabetes, work closely with your health care provider to keep your blood sugar under control.  If you have numbness in your feet:  Check every day for signs of injury or infection. Watch for redness, warmth, and swelling.  Wear padded socks and comfortable shoes. These help protect your feet.  Do not do things that put pressure on your damaged nerve.  Do not smoke. Smoking keeps blood from getting to damaged nerves.  Avoid or limit alcohol. Too much alcohol can cause a lack of B vitamins. These vitamins are needed for healthy nerves.  Develop a good support system. Coping with peripheral neuropathy can be stressful. Talk to a mental health specialist or join a support group if you are struggling.  Follow up with your health care provider as directed. Contact a health care provider if:  You have new signs or symptoms of peripheral neuropathy.  You are struggling emotionally from dealing with peripheral neuropathy.  You have a fever. Get help right away if:  You have an injury or infection that is not healing.  You feel very dizzy or begin vomiting.  You have chest pain.  You have trouble breathing. This information is not intended to replace advice given to you by your health care provider. Make sure you discuss any questions you have with your health care provider. Document Released: 05/04/2002 Document  Revised: 10/20/2015 Document Reviewed: 01/19/2013 Elsevier Interactive Patient Education  2017 [employer].

## 2016-09-07 LAB — LIPID PANEL
Chol/HDL Ratio: 7.5 ratio — ABNORMAL HIGH (ref 0.0–4.4)
Cholesterol, Total: 248 mg/dL — ABNORMAL HIGH (ref 100–199)
HDL: 33 mg/dL — ABNORMAL LOW (ref >39)
LDL Calculated: 150 mg/dL — ABNORMAL HIGH (ref 0–99)
Triglycerides: 324 mg/dL — ABNORMAL HIGH (ref 0–149)
VLDL Cholesterol Cal: 65 mg/dL — ABNORMAL HIGH (ref 5–40)

## 2016-09-07 LAB — COMPREHENSIVE METABOLIC PANEL
ALT: 8 IU/L (ref 0–32)
AST: 16 IU/L (ref 0–40)
Albumin/Globulin Ratio: 1.8 (ref 1.2–2.2)
Albumin: 4.5 g/dL (ref 3.5–5.5)
Alkaline Phosphatase: 53 IU/L (ref 39–117)
BUN/Creatinine Ratio: 19 (ref 9–23)
BUN: 23 mg/dL (ref 6–24)
Bilirubin Total: 0.5 mg/dL (ref 0.0–1.2)
CO2: 27 mmol/L (ref 18–29)
Calcium: 10 mg/dL (ref 8.7–10.2)
Chloride: 96 mmol/L (ref 96–106)
Creatinine, Ser: 1.19 mg/dL — ABNORMAL HIGH (ref 0.57–1.00)
GFR calc Af Amer: 59 mL/min/{1.73_m2} — ABNORMAL LOW (ref >59)
GFR calc non Af Amer: 52 mL/min/{1.73_m2} — ABNORMAL LOW (ref >59)
Globulin, Total: 2.5 g/dL (ref 1.5–4.5)
Glucose: 109 mg/dL — ABNORMAL HIGH (ref 65–99)
Potassium: 5 mmol/L (ref 3.5–5.2)
Sodium: 135 mmol/L (ref 134–144)
Total Protein: 7 g/dL (ref 6.0–8.5)

## 2016-09-07 LAB — MICROALBUMIN / CREATININE URINE RATIO
Creatinine, Urine: 79.9 mg/dL
Microalb/Creat Ratio: <3.8 mg/g creat (ref 0.0–30.0)
Microalbumin, Urine: <3 ug/mL

## 2016-09-07 LAB — TSH: TSH: 0.459 u[IU]/mL (ref 0.450–4.500)

## 2016-09-08 MED ORDER — ATORVASTATIN CALCIUM 10 MG PO TABS: 10.0000 mg | ORAL_TABLET | Freq: Every day | ORAL | 1 refills | Status: DC

## 2016-09-10 ENCOUNTER — Telehealth: Payer: Self-pay | Admitting: Family Medicine

## 2016-09-10 NOTE — Telephone Encounter (Signed)
Pt advised and will call with an update

## 2016-09-10 NOTE — Telephone Encounter (Signed)
Recent Metformin increase; adverse side effects- diarrhea and tightness of stomach...  Please call to advise  [phone]

## 2016-09-10 NOTE — Telephone Encounter (Signed)
If she can stick it out, they will generally go away in several weeks (about 3). If she can't, then try 1/2 tab but if still intolerable drop back down to prior level.

## 2016-09-10 NOTE — Telephone Encounter (Signed)
These can be expected with metformin increase Advice?

## 2016-09-13 LAB — HM DIABETES EYE EXAM

## 2016-11-16 ENCOUNTER — Ambulatory Visit (INDEPENDENT_AMBULATORY_CARE_PROVIDER_SITE_OTHER): Payer: BLUE CROSS/BLUE SHIELD | Admitting: Family Medicine

## 2016-11-16 ENCOUNTER — Encounter: Payer: Self-pay | Admitting: Family Medicine

## 2016-11-16 VITALS — BP 103/67 | HR 87 | Temp 98.5°F | Resp 16 | Ht 66.0 in | Wt 227.0 lb

## 2016-11-16 DIAGNOSIS — E860 Dehydration: Secondary | ICD-10-CM | POA: Diagnosis not present

## 2016-11-16 DIAGNOSIS — R809 Proteinuria, unspecified: Secondary | ICD-10-CM

## 2016-11-16 DIAGNOSIS — I10 Essential (primary) hypertension: Secondary | ICD-10-CM | POA: Diagnosis not present

## 2016-11-16 DIAGNOSIS — E1142 Type 2 diabetes mellitus with diabetic polyneuropathy: Secondary | ICD-10-CM

## 2016-11-16 DIAGNOSIS — R300 Dysuria: Secondary | ICD-10-CM | POA: Diagnosis not present

## 2016-11-16 LAB — POC MICROSCOPIC URINALYSIS (UMFC): Mucus: ABSENT

## 2016-11-16 LAB — POCT URINALYSIS DIP (MANUAL ENTRY)
Bilirubin, UA: NEGATIVE
Bilirubin, UA: NEGATIVE
Blood, UA: NEGATIVE
Glucose, UA: NEGATIVE mg/dL
Glucose, UA: NEGATIVE mg/dL
Nitrite, UA: NEGATIVE
Nitrite, UA: NEGATIVE
Protein Ur, POC: 30 mg/dL — AB
Protein Ur, POC: 30 mg/dL — AB
Spec Grav, UA: >=1.03 — AB (ref 1.010–1.025)
Spec Grav, UA: 1.025 (ref 1.010–1.025)
Urobilinogen, UA: 0.2 E.U./dL
Urobilinogen, UA: 0.2 E.U./dL
pH, UA: 5.5 (ref 5.0–8.0)
pH, UA: 5.5 (ref 5.0–8.0)

## 2016-11-16 NOTE — Progress Notes (Signed)
SH

## 2016-11-16 NOTE — Patient Instructions (Addendum)
We recommend that you schedule a mammogram for breast cancer screening. Typically, you do not need a referral to do this. Please contact a local imaging center to schedule your mammogram.  [facility] - [phone]  *ask for the Radiology Department [facility] ([place]) - [phone] or [phone]  [facility] - [phone] [place] [phone] [facility] - [phone]  *ask for the [facility] - [phone]  *ask for the Radiology Department [facility] - [phone]  *ask for the [place] - [phone]    IF you received an x-ray today, you will receive an invoice from [facility] Radiology. Please contact [facility] Radiology at [phone] with questions or concerns regarding your invoice.   IF you received labwork today, you will receive an invoice from [facility]. Please contact [facility] at [phone] with questions or concerns regarding your invoice.   Our billing staff will not be able to assist you with questions regarding bills from these companies.  You will be contacted with the lab results as soon as they are available. The fastest way to get your results is to activate your My Chart account. Instructions are located on the last page of this paperwork. If you have not heard from [place] regarding the results in 2 weeks, please contact this office.     Rehydration, Adult Rehydration is the replacement of body fluids and salts and minerals (electrolytes) that are lost during dehydration. Dehydration is when there is not enough fluid or water in the body. This happens when you lose more fluids than you take in. Common causes of dehydration include:  Vomiting.  Diarrhea.  Excessive sweating, such as from heat exposure or exercise.  Taking medicines that cause the body to lose excess fluid (diuretics).  Impaired  kidney function.  Not drinking enough fluid.  Certain illnesses or infections.  Certain poorly controlled long-term (chronic) illnesses, such as diabetes, heart disease, and kidney disease.  Symptoms of mild dehydration may include thirst, dry lips and mouth, dry skin, and dizziness. Symptoms of severe dehydration may include increased heart rate, confusion, fainting, and not urinating. You can rehydrate by drinking certain fluids or getting fluids through an IV tube, as told by your health care provider. What are the risks? Generally, rehydration is safe. However, one problem that can happen is taking in too much fluid (overhydration). This is rare. If overhydration happens, it can cause an electrolyte imbalance, kidney failure, or a decrease in salt (sodium) levels in the body. How to rehydrate Follow instructions from your health care provider for rehydration. The kind of fluid you should drink and the amount you should drink depend on your condition.  If directed by your health care provider, drink an oral rehydration solution (ORS). This is a drink designed to treat dehydration that is found in pharmacies and retail stores. ? Make an ORS by following instructions on the package. ? Start by drinking small amounts, about  cup (120 mL) every 5-10 minutes. ? Slowly increase how much you drink until you have taken the amount recommended by your health care provider.  Drink enough clear fluids to keep your urine clear or pale yellow. If you were instructed to drink an ORS, finish the ORS first, then start slowly drinking other clear fluids. Drink fluids such as: ? Water. Do not drink only water. Doing that can lead to having too little sodium in your body (  hyponatremia). ? Ice chips. ? Fruit juice that you have added water to (diluted juice). ? Low-calorie sports drinks.  If you are severely dehydrated, your health care provider may recommend that you receive fluids through an IV tube in  the hospital.  Do not take sodium tablets. Doing that can lead to the condition of having too much sodium in your body (hypernatremia).  Eating while you rehydrate Follow instructions from your health care provider about what to eat while you rehydrate. Your health care provider may recommend that you slowly begin eating regular foods in small amounts.  Eat foods that contain a healthy balance of electrolytes, such as bananas, oranges, potatoes, tomatoes, and spinach.  Avoid foods that are greasy or contain a lot of fat or sugar.  In some cases, you may get nutrition through a feeding tube that is passed through your nose and into your stomach (nasogastric tube, or NG tube). This may be done if you have uncontrolled vomiting or diarrhea. Beverages to avoid Certain beverages may make dehydration worse. While you rehydrate, avoid:  Alcohol.  Caffeine.  Drinks that contain a lot of sugar. These include: ? High-calorie sports drinks. ? Fruit juice that is not diluted. ? Soda.  Check nutrition labels to see how much sugar or caffeine a beverage contains. Signs of dehydration recovery You may be recovering from dehydration if:  You are urinating more often than before you started rehydrating.  Your urine is clear or pale yellow.  Your energy level improves.  You vomit less frequently.  You have diarrhea less frequently.  Your appetite improves or returns to normal.  You feel less dizzy or less light-headed.  Your skin tone and color start to look more normal.  Contact a health care provider if:  You continue to have symptoms of mild dehydration, such as: ? Thirst. ? Dry lips. ? Slightly dry mouth. ? Dry, warm skin. ? Dizziness.  You continue to vomit or have diarrhea. Get help right away if:  You have symptoms of dehydration that get worse.  You feel: ? Confused. ? Weak. ? Like you are going to faint.  You have not urinated in 6-8 hours.  You have very dark  urine.  You have trouble breathing.  Your heart rate while sitting still is over 100 beats a minute.  You cannot drink fluids without vomiting.  You have vomiting or diarrhea that: ? Gets worse. ? Does not go away.  You have a fever. This information is not intended to replace advice given to you by your health care provider. Make sure you discuss any questions you have with your health care provider. Document Released: 08/06/2011 Document Revised: 12/02/2015 Document Reviewed: 07/08/2015 Elsevier Interactive Patient Education  [employer].

## 2016-11-16 NOTE — Progress Notes (Signed)
Chief Complaint  Patient presents with  . Urinary Tract Infection    x 4-5 days, painful and drinking cranberry juice to get thru, not normal intake of water this week  . Diarrhea    wednesday like water but no more episodes since    HPI   Painful urination with urgency and increase frequency for 5 days She reports that in the morning she has an elevated temperature She states that she has some back pain but attributes it to a sedentary job and chronic back pain She has been drinking some cranberry juice for her symptoms She states that it helps with her symptoms during  She has not been drinking water   She also reports that she had one day of diarrhea She ate [employer] breakfast 2 days ago and had 3 bowel movements She had a sausage mcgriddle She reports that since the one episode 3 days ago she has no longer had any more diarrhea  Diabetes Mellitus: Patient presents diabetes mellitus. She is not here primarily for this but reports that she is concerned about how dark her urine is and states that she was drinking a lot of cranberry juice She had an episode of diarrhea after eating a mcgriddle She was busy this week but normally sticks to an ada diet Lab Results  Component Value Date   HGBA1C 6.9 09/06/2016   Her home glucose readings are in the 180s for a few readings but on average 107  Hypertension: Patient here for follow-up of elevated blood pressure. She is taking her lisinopril-hctz for her bp and reports that she was not drinking much today She denies dizziness, no chest pains or palpitations BP Readings from Last 3 Encounters:  11/16/16 103/67  09/06/16 134/85  08/01/16 116/76     Past Medical History:  Diagnosis Date  . Allergic rhinitis   . Anxiety   . Arthritis   . Diabetes mellitus without complication ([place])   . Hyperlipidemia   . Hypertension   . Hypothyroid   . Lower extremity edema     Current Outpatient Prescriptions  Medication Sig Dispense  Refill  . acetaminophen (TYLENOL) 500 MG tablet Take 1,000 mg by mouth every 6 (six) hours as needed for moderate pain or headache.    Marland Kitchen atorvastatin (LIPITOR) 10 MG tablet Take 1 tablet (10 mg total) by mouth daily. 90 tablet 1  . Clobetasol Propionate 0.05 % shampoo Apply 5-10 mL daily as needed to scalp 354 mL 8  . glucose blood test strip Use as instructed to check cbgs bid. 100 each 12  . ibuprofen (ADVIL,MOTRIN) 200 MG tablet Take 200 mg by mouth every 6 (six) hours as needed.    . Lancets MISC Check blood glucose twice a week. DX:250.00 100 each 0  . levothyroxine (SYNTHROID, LEVOTHROID) 137 MCG tablet Take 1 tablet (137 mcg total) by mouth daily before breakfast. 90 tablet 3  . lisinopril-hydrochlorothiazide (PRINZIDE,ZESTORETIC) 20-25 MG tablet Take 1 tablet by mouth daily. 90 tablet 3  . loratadine (CLARITIN) 10 MG tablet Take 10 mg by mouth daily.    . metFORMIN (GLUCOPHAGE) 500 MG tablet Take 1 tablet (500 mg total) by mouth 2 (two) times daily with a meal. Then increase monthly as instructed up to 2 tabs bid. 180 tablet 0  . methocarbamol (ROBAXIN) 500 MG tablet Take 1-2 tablets (500-1,000 mg total) by mouth at bedtime as needed for muscle spasms. 180 tablet 1  . triamcinolone cream (KENALOG) 0.1 % Apply 1 application topically  2 (two) times daily. 30 g 0  . Multiple Vitamins-Minerals (MULTIVITAMIN WITH MINERALS) tablet Take 1 tablet by mouth daily.     No current facility-administered medications for this visit.     Allergies:  Allergies  Allergen Reactions  . Niacin Other (See Comments)    Turns red from chest up and body gets hot.  . Other Other (See Comments)    IBS sx caused by celery, avocado, whole milk, almonds  . Almond (Diagnostic) Itching  . Codeine Other (See Comments)    Becomes incoherent  . Pravastatin Other (See Comments)    Joint pain     Past Surgical History:  Procedure Laterality Date  . ABDOMINAL HYSTERECTOMY  2008  . CERVICAL SPINE SURGERY      Ruptured disc  . CESAREAN SECTION    . CHOLECYSTECTOMY    . JOINT REPLACEMENT    . OOPHORECTOMY  2008    Social History   Social History  . Marital status: Divorced    Spouse name: N/A  . Number of children: 2  . Years of education: N/A   Occupational History  . Land   Social History Main Topics  . Smoking status: Never Smoker  . Smokeless tobacco: Never Used  . Alcohol use 0.0 oz/week  . Drug use: No  . Sexual activity: No   Other Topics Concern  . None   Social History Narrative   Marital status: divorced; not dating.       Children:  2 daughters (26, 36); no grandchildren.      Employment: [employer] supervisor      Tobacco; none since high school       Alcohol:  Weekends.   Does not exercise regularly          ROS See hpi  Objective: Vitals:   11/16/16 1710  BP: 103/67  Pulse: 87  Resp: 16  Temp: 98.5 F (36.9 C)  TempSrc: Oral  SpO2: 96%  Weight: 227 lb (103 kg)  Height: 5\' 6"  (1.676 m)    Physical Exam  Constitutional: She is oriented to person, place, and time. She appears well-developed and well-nourished.  HENT:  Head: Normocephalic and atraumatic.  Eyes: Conjunctivae and EOM are normal.  Cardiovascular: Normal rate, regular rhythm and normal heart sounds.   No murmur heard. Pulmonary/Chest: Effort normal and breath sounds normal. No respiratory distress. She has no wheezes.  Abdominal:  No suprapubic tenderness, no cva tenderness  Neurological: She is alert and oriented to person, place, and time.  Skin: Skin is warm. No erythema.  Psychiatric: She has a normal mood and affect. Her behavior is normal. Judgment and thought content normal.    Assessment and Plan Connie [name] was seen today for urinary tract infection and diarrhea.  Diagnoses and all orders for this visit:  Dysuria- no UTI -     POCT urinalysis dipstick -     POCT Microscopic Urinalysis ([facility]) -     Urine Culture  Dehydration- discussed ways to  replete with fluids and avoid dilution effect and hyponatremia  Type 2 diabetes mellitus with diabetic polyneuropathy, without long-term current use of insulin (HCC) -  Well controlled  Discussed hydration and med compliance  Essential hypertension- bp in good range, proteinuria noted on UA  Proteinuria, unspecified type- will recheck when pt hydration improves Pt to return to drop off urine sample Could be proteinuria as a complication of diabetes or due to concentrated urine -  POCT urinalysis dipstick; Future -     POCT urinalysis dipstick    Ramsay Bognar A Nolon Rod

## 2016-11-17 ENCOUNTER — Telehealth: Payer: Self-pay | Admitting: Family Medicine

## 2016-11-17 ENCOUNTER — Ambulatory Visit: Payer: BLUE CROSS/BLUE SHIELD | Admitting: Physician Assistant

## 2016-11-17 DIAGNOSIS — E860 Dehydration: Secondary | ICD-10-CM

## 2016-11-17 LAB — URINE CULTURE: Organism ID, Bacteria: NO GROWTH

## 2017-01-17 ENCOUNTER — Ambulatory Visit (INDEPENDENT_AMBULATORY_CARE_PROVIDER_SITE_OTHER): Payer: Managed Care, Other (non HMO) | Admitting: Emergency Medicine

## 2017-01-17 ENCOUNTER — Encounter: Payer: Self-pay | Admitting: Urgent Care

## 2017-01-17 VITALS — BP 101/68 | HR 75 | Temp 98.3°F | Resp 18 | Ht 66.0 in | Wt 221.8 lb

## 2017-01-17 DIAGNOSIS — R300 Dysuria: Secondary | ICD-10-CM | POA: Diagnosis not present

## 2017-01-17 DIAGNOSIS — N390 Urinary tract infection, site not specified: Secondary | ICD-10-CM

## 2017-01-17 LAB — POCT URINALYSIS DIP (MANUAL ENTRY)
Bilirubin, UA: NEGATIVE
Glucose, UA: NEGATIVE mg/dL
Ketones, POC UA: NEGATIVE mg/dL
Nitrite, UA: NEGATIVE
Protein Ur, POC: NEGATIVE mg/dL
Spec Grav, UA: 1.015 (ref 1.010–1.025)
Urobilinogen, UA: 0.2 E.U./dL
pH, UA: 5.5 (ref 5.0–8.0)

## 2017-01-17 MED ORDER — CIPROFLOXACIN HCL 500 MG PO TABS: 500.0000 mg | ORAL_TABLET | Freq: Two times a day (BID) | ORAL | 0 refills | Status: AC

## 2017-01-17 NOTE — Patient Instructions (Addendum)
     IF you received an x-ray today, you will receive an invoice from [facility]. Please contact [facility] at [phone] with questions or concerns regarding your invoice.   IF you received labwork today, you will receive an invoice from [facility]. Please contact [facility] at [phone] with questions or concerns regarding your invoice.   Our billing staff will not be able to assist you with questions regarding bills from these companies.  You will be contacted with the lab results as soon as they are available. The fastest way to get your results is to activate your My Chart account. Instructions are located on the last page of this paperwork. If you have not heard from us regarding the results in 2 weeks, please contact this office.     Urinary Tract Infection, Adult A urinary tract infection (UTI) is an infection of any part of the urinary tract. The urinary tract includes the:  Kidneys.  Ureters.  Bladder.  Urethra.  These organs make, store, and get rid of pee (urine) in the body. Follow these instructions at home:  Take over-the-counter and prescription medicines only as told by your doctor.  If you were prescribed an antibiotic medicine, take it as told by your doctor. Do not stop taking the antibiotic even if you start to feel better.  Avoid the following drinks: ? Alcohol. ? Caffeine. ? Tea. ? Carbonated drinks.  Drink enough fluid to keep your pee clear or pale yellow.  Keep all follow-up visits as told by your doctor. This is important.  Make sure to: ? Empty your bladder often and completely. Do not to hold pee for long periods of time. ? Empty your bladder before and after sex. ? Wipe from front to back after a bowel movement if you are female. Use each tissue one time when you wipe. Contact a doctor if:  You have back pain.  You have a fever.  You feel sick to your stomach (nauseous).  You throw up (vomit).  Your symptoms do  not get better after 3 days.  Your symptoms go away and then come back. Get help right away if:  You have very bad back pain.  You have very bad lower belly (abdominal) pain.  You are throwing up and cannot keep down any medicines or water. This information is not intended to replace advice given to you by your health care provider. Make sure you discuss any questions you have with your health care provider. Document Released: 10/31/2007 Document Revised: 10/20/2015 Document Reviewed: 04/04/2015 Elsevier Interactive Patient Education  2018 Elsevier Inc.  
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
## 2017-01-18 LAB — URINE CULTURE

## 2017-01-21 ENCOUNTER — Telehealth: Payer: Self-pay | Admitting: Family Medicine

## 2017-01-21 NOTE — Telephone Encounter (Signed)
LMOM TO CALL AN RESCHEDULE HER CPE THAT WAS ON 03-11-17 WITH SHAW THE APPOINTMENT WAS RESCHEDULE CAUSE PROVIDER WANT BE IN THE OFFICE THAT DAY

## 2017-02-22 LAB — HM DIABETES EYE EXAM

## 2017-03-11 ENCOUNTER — Encounter: Payer: BLUE CROSS/BLUE SHIELD | Admitting: Family Medicine

## 2017-03-25 ENCOUNTER — Ambulatory Visit (INDEPENDENT_AMBULATORY_CARE_PROVIDER_SITE_OTHER): Payer: Managed Care, Other (non HMO) | Admitting: Family Medicine

## 2017-03-25 ENCOUNTER — Encounter: Payer: Self-pay | Admitting: Family Medicine

## 2017-03-25 VITALS — BP 108/50 | HR 80 | Temp 98.4°F | Resp 16 | Ht 66.0 in | Wt 223.2 lb

## 2017-03-25 DIAGNOSIS — E1142 Type 2 diabetes mellitus with diabetic polyneuropathy: Secondary | ICD-10-CM | POA: Diagnosis not present

## 2017-03-25 DIAGNOSIS — E785 Hyperlipidemia, unspecified: Secondary | ICD-10-CM

## 2017-03-25 DIAGNOSIS — Z0000 Encounter for general adult medical examination without abnormal findings: Secondary | ICD-10-CM

## 2017-03-25 DIAGNOSIS — L918 Other hypertrophic disorders of the skin: Secondary | ICD-10-CM

## 2017-03-25 DIAGNOSIS — Z5181 Encounter for therapeutic drug level monitoring: Secondary | ICD-10-CM | POA: Diagnosis not present

## 2017-03-25 DIAGNOSIS — K760 Fatty (change of) liver, not elsewhere classified: Secondary | ICD-10-CM

## 2017-03-25 DIAGNOSIS — I10 Essential (primary) hypertension: Secondary | ICD-10-CM | POA: Diagnosis not present

## 2017-03-25 DIAGNOSIS — Z1231 Encounter for screening mammogram for malignant neoplasm of breast: Secondary | ICD-10-CM

## 2017-03-25 DIAGNOSIS — E039 Hypothyroidism, unspecified: Secondary | ICD-10-CM | POA: Diagnosis not present

## 2017-03-25 DIAGNOSIS — Z1239 Encounter for other screening for malignant neoplasm of breast: Secondary | ICD-10-CM

## 2017-03-25 DIAGNOSIS — E559 Vitamin D deficiency, unspecified: Secondary | ICD-10-CM | POA: Diagnosis not present

## 2017-03-25 LAB — POCT URINALYSIS DIP (MANUAL ENTRY)
Bilirubin, UA: NEGATIVE
Glucose, UA: NEGATIVE mg/dL
Ketones, POC UA: NEGATIVE mg/dL
Leukocytes, UA: NEGATIVE
Nitrite, UA: NEGATIVE
Protein Ur, POC: NEGATIVE mg/dL
Spec Grav, UA: >=1.03 — AB (ref 1.010–1.025)
Urobilinogen, UA: 0.2 E.U./dL
pH, UA: 5.5 (ref 5.0–8.0)

## 2017-03-25 LAB — POCT GLYCOSYLATED HEMOGLOBIN (HGB A1C): Hemoglobin A1C: 6.5

## 2017-03-25 MED ORDER — FLUOCINOLONE ACETONIDE 0.01 % EX SHAM: 1.0000 "application " | MEDICATED_SHAMPOO | Freq: Every day | CUTANEOUS | 2 refills | Status: DC | PRN

## 2017-03-25 MED ORDER — FLUOCINOLONE ACETONIDE 0.01 % EX SOLN: Freq: Two times a day (BID) | CUTANEOUS | 3 refills | Status: DC

## 2017-03-25 NOTE — Progress Notes (Deleted)
Subjective:    Patient ID: Connie [name], female    DOB: 1960/12/08, 56 y.o.   MRN: [id number] No chief complaint on file.   HPI  Connie [name] is a delightful 56 yo woman here today for her CPE.  I last saw her 9 mos prior.  SHe has NOT had a CPE prior in [facility] She quit her job and has been travelig. First granddaughter Hollin in [place]. Has been having more lower back pains when she walks and she feels more tense. Does a reflexology monthly for a massage. Does  Her pinky toes goe numb  Will try yoga Going to derm for mole check  - K in [facility]  Primary preventative screens: Cervical Ca: Had total hysterectomy with cervix removed and BSO in 2008 - no malignancy Breast Ca: last done at [facility] 04/2013 CRS: Done 2016 Immunizations:  Pneumovax-23 and tdap 2014, flu shot annually OTC meds/supp/vit: Diet/Exercise: Vision/Dental:  Seen annually at [place]  At the health store that think she has 4 times to much toxin - poss from her liver and she has been getting a B supp Beta-cod, diabetic, detox supp Hopeing it will help 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 Eating 3x/d and doing better, some sweets.  Chronic medical conditions: DMII: Last a1c 9 mos prior was 6.7. Last saw optho Dr. Katy Fitch 07/2014. Nml urine microalb 05/2015. On metformin 500 bid. Hypothyroid: On levothyroxine 137 HTN: on lisinopril-hctz 20-25 Vitamin D def: 14  9 mos piror.  Past Medical History:  Diagnosis Date  . Allergic rhinitis   . Anxiety   . Arthritis   . Diabetes mellitus without complication ([place])   . Hyperlipidemia   . Hypertension   . Hypothyroid   . Lower extremity edema    Past Surgical History:  Procedure Laterality Date  . ABDOMINAL HYSTERECTOMY  2008  . CERVICAL SPINE SURGERY     Ruptured disc  . CESAREAN SECTION    . CHOLECYSTECTOMY    . JOINT  REPLACEMENT    . OOPHORECTOMY  2008   Current Outpatient Prescriptions on File Prior to Visit  Medication Sig Dispense Refill  . acetaminophen (TYLENOL) 500 MG tablet Take 1,000 mg by mouth every 6 (six) hours as needed for moderate pain or headache.    Marland Kitchen atorvastatin (LIPITOR) 10 MG tablet Take 1 tablet (10 mg total) by mouth daily. 90 tablet 1  . Clobetasol Propionate 0.05 % shampoo Apply 5-10 mL daily as needed to scalp (Patient not taking: Reported on 01/17/2017) 354 mL 8  . glucose blood test strip Use as instructed to check cbgs bid. 100 each 12  . ibuprofen (ADVIL,MOTRIN) 200 MG tablet Take 200 mg by mouth every 6 (six) hours as needed.    . Lancets MISC Check blood glucose twice a week. DX:250.00 100 each 0  . levothyroxine (SYNTHROID, LEVOTHROID) 137 MCG tablet Take 1 tablet (137 mcg total) by mouth daily before breakfast. 90 tablet 3  . lisinopril-hydrochlorothiazide (PRINZIDE,ZESTORETIC) 20-25 MG tablet Take 1 tablet by mouth daily. 90 tablet 3  . loratadine (CLARITIN) 10 MG tablet Take 10 mg by mouth daily.    . metFORMIN (GLUCOPHAGE) 500 MG tablet Take 1 tablet (500 mg total) by mouth 2 (two) times daily with a meal. Then increase monthly as instructed up to 2 tabs bid. 180 tablet 0  .  methocarbamol (ROBAXIN) 500 MG tablet Take 1-2 tablets (500-1,000 mg total) by mouth at bedtime as needed for muscle spasms. 180 tablet 1  . Multiple Vitamins-Minerals (MULTIVITAMIN WITH MINERALS) tablet Take 1 tablet by mouth daily.    Marland Kitchen triamcinolone cream (KENALOG) 0.1 % Apply 1 application topically 2 (two) times daily. 30 g 0   No current facility-administered medications on file prior to visit.    Allergies  Allergen Reactions  . Niacin Other (See Comments)    Turns red from chest up and body gets hot.  . Other Other (See Comments)    IBS sx caused by celery, avocado, whole milk, almonds  . Almond (Diagnostic) Itching  . Codeine Other (See Comments)    Becomes incoherent  . Pravastatin  Other (See Comments)    Joint pain    Family History  Problem Relation Age of Onset  . Stroke Mother 65  . Heart disease Maternal Grandmother   . Diabetes Maternal Grandmother   . Heart disease Maternal Grandfather   . Cancer Paternal Grandmother   . Diabetes Other        1st degree relative  . Hypertension Other   . Pancreatic cancer Other   . Stroke Other        Grandmother  . Coronary artery disease Neg Hx        Premature or sudden cardiac death   Social History   Social History  . Marital status: Divorced    Spouse name: N/A  . Number of children: 2  . Years of education: N/A   Occupational History  . Land   Social History Main Topics  . Smoking status: Never Smoker  . Smokeless tobacco: Never Used  . Alcohol use 0.0 oz/week  . Drug use: No  . Sexual activity: No   Other Topics Concern  . Not on file   Social History Narrative   Marital status: divorced; not dating.       Children:  2 daughters (26, 64); no grandchildren.      Employment: [employer] supervisor      Tobacco; none since high school       Alcohol:  Weekends.   Does not exercise regularly            Review of Systems See hpi    Objective:   Physical Exam  Constitutional: She is oriented to person, place, and time. She appears well-developed and well-nourished. No distress.  HENT:  Head: Normocephalic and atraumatic.  Right Ear: Tympanic membrane, external ear and ear canal normal.  Left Ear: Tympanic membrane, external ear and ear canal normal.  Nose: Nose normal. No mucosal edema or rhinorrhea.  Mouth/Throat: Uvula is midline, oropharynx is clear and moist and mucous membranes are normal. No posterior oropharyngeal erythema.  Eyes: Pupils are equal, round, and reactive to light. Conjunctivae and EOM are normal. Right eye exhibits no discharge. Left eye exhibits no discharge. No scleral icterus.  Neck: Normal range of motion. Neck supple. No thyromegaly present.    Cardiovascular: Normal rate, regular rhythm, normal heart sounds and intact distal pulses.   Pulmonary/Chest: Effort normal and breath sounds normal. No respiratory distress.  Abdominal: Soft. Bowel sounds are normal. There is no tenderness.  Genitourinary: Rectal exam shows external hemorrhoid, internal hemorrhoid, fissure and tenderness.  Genitourinary Comments: Midline proximal bleeding anal fissure with a large non-thrombosed external hemorrhoid on each side. Multiple non-inflamed internal hemorrhoids.  Musculoskeletal: She exhibits no edema.  Lymphadenopathy:  She has no cervical adenopathy.  Neurological: She is alert and oriented to person, place, and time. She has normal reflexes.  Skin: Skin is warm and dry. She is not diaphoretic. No erythema.  Psychiatric: She has a normal mood and affect. Her behavior is normal.     There were no vitals taken for this visit.  Results for orders placed or performed in visit on 03/06/17  HM DIABETES EYE EXAM  Result Value Ref Range   HM Diabetic Eye Exam No Retinopathy No Retinopathy       Assessment & Plan:  Needs mammogram No pap due to total hys with bso for benign indication (cervcitis and fibroids) in 2008 Flu shot   Up to Dr. Ferdinand Lango GI - call if she can' see On 7/4 she had huge amount of swelling as she was out of her levothyorxine fr 3-4d  1. Annual physical exam   2. Encounter for screening mammogram for breast cancer   3. Screening for deficiency anemia   4. Essential hypertension   5. Hepatic steatosis   6. Hypothyroidism, unspecified type - stable, refilled levothyroxine 137  7. Other specified diabetes mellitus with diabetic polyneuropathy, without long-term current use of insulin (HCC) - DM worsening with a1c 6.9 -> 7.4 so increase metformin 500 bid to 1000 bid  8. Hyperlipidemia LDL goal <100  - tried pravastatin x 3 wks in 2014 and caused joint pain. No other hpl med trials. rec trial of very low dose of a different  statin - cons atorvastatin - along with coenzyme q10  9. ANXIETY DEPRESSION   10. Screening for cardiovascular, respiratory, and genitourinary diseases   11. Vitamin D deficiency   12. External hemorrhoid   13. Fissure, anal  - stop the hydrocortisone hemorrhoidal trx, failed tucks pads, suppositories, prep H cream.  Needs freq sitz baths, increase fiber to keep stool soft but formed, and start intra-anal nitro gel to promote fissure healing. If sxs do not improve, may need to try to get pt back to her prev GI doc - Dr. Ferdinand Lango (though pt may need to change offices as had an outstanding account).    No orders of the defined types were placed in this encounter.   No orders of the defined types were placed in this encounter.    [name], M.D.  [facility] [address] [place], [place] [address] [phone] phone [phone] fax  03/25/17 8:15 AM

## 2017-03-25 NOTE — Patient Instructions (Addendum)
   IF you received an x-ray today, you will receive an invoice from [facility]. Please contact [facility] at [phone] with questions or concerns regarding your invoice.   IF you received labwork today, you will receive an invoice from [facility]. Please contact [facility] at [phone] with questions or concerns regarding your invoice.   Our billing staff will not be able to assist you with questions regarding bills from these companies.  You will be contacted with the lab results as soon as they are available. The fastest way to get your results is to activate your My Chart account. Instructions are located on the last page of this paperwork. If you have not heard from us regarding the results in 2 weeks, please contact this office.     Health Maintenance for Postmenopausal Women Menopause is a normal process in which your reproductive ability comes to an end. This process happens gradually over a span of months to years, usually between the ages of 48 and 55. Menopause is complete when you have missed 12 consecutive menstrual periods. It is important to talk with your health care provider about some of the most common conditions that affect postmenopausal women, such as heart disease, cancer, and bone loss (osteoporosis). Adopting a healthy lifestyle and getting preventive care can help to promote your health and wellness. Those actions can also lower your chances of developing some of these common conditions. What should I know about menopause? During menopause, you may experience a number of symptoms, such as:  Moderate-to-severe hot flashes.  Night sweats.  Decrease in sex drive.  Mood swings.  Headaches.  Tiredness.  Irritability.  Memory problems.  Insomnia.  Choosing to treat or not to treat menopausal changes is an individual decision that you make with your health care provider. What should I know about hormone replacement therapy and  supplements? Hormone therapy products are effective for treating symptoms that are associated with menopause, such as hot flashes and night sweats. Hormone replacement carries certain risks, especially as you become older. If you are thinking about using estrogen or estrogen with progestin treatments, discuss the benefits and risks with your health care provider. What should I know about heart disease and stroke? Heart disease, heart attack, and stroke become more likely as you age. This may be due, in part, to the hormonal changes that your body experiences during menopause. These can affect how your body processes dietary fats, triglycerides, and cholesterol. Heart attack and stroke are both medical emergencies. There are many things that you can do to help prevent heart disease and stroke:  Have your blood pressure checked at least every 1-2 years. High blood pressure causes heart disease and increases the risk of stroke.  If you are 55-79 years old, ask your health care provider if you should take aspirin to prevent a heart attack or a stroke.  Do not use any tobacco products, including cigarettes, chewing tobacco, or electronic cigarettes. If you need help quitting, ask your health care provider.  It is important to eat a healthy diet and maintain a healthy weight. ? Be sure to include plenty of vegetables, fruits, low-fat dairy products, and lean protein. ? Avoid eating foods that are high in solid fats, added sugars, or salt (sodium).  Get regular exercise. This is one of the most important things that you can do for your health. ? Try to exercise for at least 150 minutes each week. The type of exercise that you do should increase your   heart rate and make you sweat. This is known as moderate-intensity exercise. ? Try to do strengthening exercises at least twice each week. Do these in addition to the moderate-intensity exercise.  Know your numbers.Ask your health care provider to check  your cholesterol and your blood glucose. Continue to have your blood tested as directed by your health care provider.  What should I know about cancer screening? There are several types of cancer. Take the following steps to reduce your risk and to catch any cancer development as early as possible. Breast Cancer  Practice breast self-awareness. ? This means understanding how your breasts normally appear and feel. ? It also means doing regular breast self-exams. Let your health care provider know about any changes, no matter how small.  If you are 40 or older, have a clinician do a breast exam (clinical breast exam or CBE) every year. Depending on your age, family history, and medical history, it may be recommended that you also have a yearly breast X-ray (mammogram).  If you have a family history of breast cancer, talk with your health care provider about genetic screening.  If you are at high risk for breast cancer, talk with your health care provider about having an MRI and a mammogram every year.  Breast cancer (BRCA) gene test is recommended for women who have family members with BRCA-related cancers. Results of the assessment will determine the need for genetic counseling and BRCA1 and for BRCA2 testing. BRCA-related cancers include these types: ? Breast. This occurs in males or females. ? Ovarian. ? Tubal. This may also be called fallopian tube cancer. ? Cancer of the abdominal or pelvic lining (peritoneal cancer). ? Prostate. ? Pancreatic.  Cervical, Uterine, and Ovarian Cancer Your health care provider may recommend that you be screened regularly for cancer of the pelvic organs. These include your ovaries, uterus, and vagina. This screening involves a pelvic exam, which includes checking for microscopic changes to the surface of your cervix (Pap test).  For women ages 21-65, health care providers may recommend a pelvic exam and a Pap test every three years. For women ages 30-65,  they may recommend the Pap test and pelvic exam, combined with testing for human papilloma virus (HPV), every five years. Some types of HPV increase your risk of cervical cancer. Testing for HPV may also be done on women of any age who have unclear Pap test results.  Other health care providers may not recommend any screening for nonpregnant women who are considered low risk for pelvic cancer and have no symptoms. Ask your health care provider if a screening pelvic exam is right for you.  If you have had past treatment for cervical cancer or a condition that could lead to cancer, you need Pap tests and screening for cancer for at least 20 years after your treatment. If Pap tests have been discontinued for you, your risk factors (such as having a new sexual partner) need to be reassessed to determine if you should start having screenings again. Some women have medical problems that increase the chance of getting cervical cancer. In these cases, your health care provider may recommend that you have screening and Pap tests more often.  If you have a family history of uterine cancer or ovarian cancer, talk with your health care provider about genetic screening.  If you have vaginal bleeding after reaching menopause, tell your health care provider.  There are currently no reliable tests available to screen for ovarian cancer.  Lung   Cancer Lung cancer screening is recommended for adults 55-80 years old who are at high risk for lung cancer because of a history of smoking. A yearly low-dose CT scan of the lungs is recommended if you:  Currently smoke.  Have a history of at least 30 pack-years of smoking and you currently smoke or have quit within the past 15 years. A pack-year is smoking an average of one pack of cigarettes per day for one year.  Yearly screening should:  Continue until it has been 15 years since you quit.  Stop if you develop a health problem that would prevent you from having lung  cancer treatment.  Colorectal Cancer  This type of cancer can be detected and can often be prevented.  Routine colorectal cancer screening usually begins at age 50 and continues through age 75.  If you have risk factors for colon cancer, your health care provider may recommend that you be screened at an earlier age.  If you have a family history of colorectal cancer, talk with your health care provider about genetic screening.  Your health care provider may also recommend using home test kits to check for hidden blood in your stool.  A small camera at the end of a tube can be used to examine your colon directly (sigmoidoscopy or colonoscopy). This is done to check for the earliest forms of colorectal cancer.  Direct examination of the colon should be repeated every 5-10 years until age 75. However, if early forms of precancerous polyps or small growths are found or if you have a family history or genetic risk for colorectal cancer, you may need to be screened more often.  Skin Cancer  Check your skin from head to toe regularly.  Monitor any moles. Be sure to tell your health care provider: ? About any new moles or changes in moles, especially if there is a change in a mole's shape or color. ? If you have a mole that is larger than the size of a pencil eraser.  If any of your family members has a history of skin cancer, especially at a young age, talk with your health care provider about genetic screening.  Always use sunscreen. Apply sunscreen liberally and repeatedly throughout the day.  Whenever you are outside, protect yourself by wearing long sleeves, pants, a wide-brimmed hat, and sunglasses.  What should I know about osteoporosis? Osteoporosis is a condition in which bone destruction happens more quickly than new bone creation. After menopause, you may be at an increased risk for osteoporosis. To help prevent osteoporosis or the bone fractures that can happen because of  osteoporosis, the following is recommended:  If you are 19-50 years old, get at least 1,000 mg of calcium and at least 600 mg of vitamin D per day.  If you are older than age 50 but younger than age 70, get at least 1,200 mg of calcium and at least 600 mg of vitamin D per day.  If you are older than age 70, get at least 1,200 mg of calcium and at least 800 mg of vitamin D per day.  Smoking and excessive alcohol intake increase the risk of osteoporosis. Eat foods that are rich in calcium and vitamin D, and do weight-bearing exercises several times each week as directed by your health care provider. What should I know about how menopause affects my mental health? Depression may occur at any age, but it is more common as you become older. Common symptoms of depression   Low or sad mood.  Changes in sleep patterns.  Changes in appetite or eating patterns.  Feeling an overall lack of motivation or enjoyment of activities that you previously enjoyed.  Frequent crying spells.  Talk with your health care provider if you think that you are experiencing depression. What should I know about immunizations? It is important that you get and maintain your immunizations. These include:  Tetanus, diphtheria, and pertussis (Tdap) booster vaccine.  Influenza every year before the flu season begins.  Pneumonia vaccine.  Shingles vaccine.  Your health care provider may also recommend other immunizations. This information is not intended to replace advice given to you by your health care provider. Make sure you discuss any questions you have with your health care provider. Document Released: 07/06/2005 Document Revised: 12/02/2015 Document Reviewed: 02/15/2015 Elsevier Interactive Patient Education  2018 [employer] Seborrheic Dermatitis, Adult Seborrheic dermatitis is a skin disease that causes red, scaly patches. It usually occurs on the scalp, and it is often called dandruff. The  patches may appear on other parts of the body. Skin patches tend to appear where there are many oil glands in the skin. Areas of the body that are commonly affected include:  Scalp.  Skin folds of the body.  Ears.  Eyebrows.  Neck.  Face.  Armpits.  The bearded area of men's faces.  The condition may come and go for no known reason, and it is often long-lasting (chronic). What are the causes? The cause of this condition is not known. What increases the risk? This condition is more likely to develop in people who:  Have certain conditions, such as: ? HIV (human immunodeficiency virus). ? AIDS (acquired immunodeficiency syndrome). ? Parkinson disease. ? Mood disorders, such as depression.  Are 79-50 years old.  What are the signs or symptoms? Symptoms of this condition include:  Thick scales on the scalp.  Redness on the face or in the armpits.  Skin that is flaky. The flakes may be white or yellow.  Skin that seems oily or dry but is not helped with moisturizers.  Itching or burning in the affected areas.  How is this diagnosed? This condition is diagnosed with a medical history and physical exam. A sample of your skin may be tested (skin biopsy). You may need to see a skin specialist (dermatologist). How is this treated? There is no cure for this condition, but treatment can help to manage the symptoms. You may get treatment to remove scales, lower the risk of skin infection, and reduce swelling or itching. Treatment may include:  Creams that reduce swelling and irritation (steroids).  Creams that reduce skin yeast.  Medicated shampoo, soaps, moisturizing creams, or ointments.  Medicated moisturizing creams or ointments.  Follow these instructions at home:  Apply over-the-counter and prescription medicines only as told by your health care provider.  Use any medicated shampoo, soaps, skin creams, or ointments only as told by your health care  provider.  Keep all follow-up visits as told by your health care provider. This is important. Contact a health care provider if:  Your symptoms do not improve with treatment.  Your symptoms get worse.  You have new symptoms. This information is not intended to replace advice given to you by your health care provider. Make sure you discuss any questions you have with your health care provider. Document Released: 05/14/2005 Document Revised: 12/02/2015 Document Reviewed: 09/01/2015 Elsevier Interactive Patient Education  2018 [employer].  Skin Tag, Adult A skin tag (acrochordon)  is a soft, extra growth of skin. Most skin tags are flesh-colored and rarely bigger than a pencil eraser. They commonly form near areas where there are folds in the skin, such as the armpit or groin. Skin tags are not dangerous, and they do not spread from person to person (are not contagious). You may have one skin tag or several. Skin tags do not require treatment. However, your health care provider may recommend removal of a skin tag if it:  Gets irritated from clothing.  Bleeds.  Is visible and unsightly.  Your health care provider can remove skin tags with a simple surgical procedure or a procedure that involves freezing the skin tag. Follow these instructions at home:  Watch for any changes in your skin tag. A normal skin tag does not require any other special care at home.  Take over-the-counter and prescription medicines only as told by your health care provider.  Keep all follow-up visits as told by your health care provider. This is important. Contact a health care provider if:  You have a skin tag that: ? Becomes painful. ? Changes color. ? Bleeds. ? Swells.  You develop more skin tags. This information is not intended to replace advice given to you by your health care provider. Make sure you discuss any questions you have with your health care provider. Document Released: 05/29/2015  Document Revised: 01/08/2016 Document Reviewed: 05/29/2015 Elsevier Interactive Patient Education  [employer].

## 2017-03-25 NOTE — Progress Notes (Signed)
Subjective:    Patient ID: Connie [name], female    DOB: Feb 14, 1961, 56 y.o.   MRN: [id number] Chief Complaint  Patient presents with  . Annual Exam    dermatitis in scalp is worse     HPI  Connie [name] is a delightful 56 yo woman here today for her CPE.   Going to derm for mole check  - K in [facility]  Primary Preventative Screenings: Cervical Cancer: Had total hysterectomy with cervix removed and BSO in 2008 - no malignancy STI screening: neg Hep C and HIV 2017 Breast Cancer:last done at [facility] 04/2013 Colorectal Cancer: 2016 - Dr. Ferdinand Lango - GI  Tobacco use/EtOH/substances: Bone Density: Cardiac: EKG 07/2014 Weight/Blood sugar/Diet/Exercise:  BMI Readings from Last 3 Encounters:  03/25/17 36.03 kg/m  01/17/17 35.80 kg/m  11/16/16 36.64 kg/m   OTC/Vit/Supp/Herbal: vinegar supp and probiotics every day - doing well. Dentist/Optho: Raquel James annually Immunizations:  Immunization History  Administered Date(s) Administered  . Influenza,inj,Quad PF,6+ Mos 04/15/2013, 05/13/2014, 02/11/2015, 03/08/2016  . Pneumococcal Polysaccharide-23 04/15/2013  . Tdap 07/04/2012     Chronic Medical Conditions: DMII: Diagnosed .   Lab Results  Component Value Date   HGBA1C 6.5 03/25/2017   HGBA1C 6.9 09/06/2016   HGBA1C 7.4 03/08/2016   CBGs: fasting a.m. ; after meal  ; No hypoglycemic episodes.  Meter type:  Diet: has cut down on metformin. Trying to do the ketogenic diet.  Exercising:  DM Med Regimen: metformin 1 tab twice a day - was doing 1 1/2 tab Prior changes:   eGFR:  Baseline Cr:  Last checked . Microalb: Normal 09/06/2016. On acei - lisinopril Lipids:  LDL 150,  non-HDL 215.  Last levels done  - were improving from prior. On statin - lipitor - which was started after last labs. Not taking asa 81 qd.  Optho: Seen annually by Dr. Katy Fitch - last exam 02/22/17 Feet: Monofilament exam done 09/06/2016. Does have some neuropathy sxs but feels like it is  imrpoving some.  Not seen by podiatry prior.  Immunizations:  Influenza: annually  Pneumovax-23: 2014  HLD:  Failed 3 wk trial of pravastatin prior (2014) due to joint pain. Started trial of lipitor 6 mos prior (with coenzyme q10??) Hypothyroid: On levothyroxine 137 HTN: on lisinopril-hctz 20-25. Not checking BP outside of the office Vitamin D def: 14 last yr Psoriasis: lifelong and worsening, over ears and eyes. Sees dermatology Dr. Nehemiah Massed in [place]. Last clobetasol shampoo was not working.   Past Medical History:  Diagnosis Date  . Allergic rhinitis   . Anxiety   . Arthritis   . Diabetes mellitus without complication ([place])   . Hyperlipidemia   . Hypertension   . Hypothyroid   . Lower extremity edema    Past Surgical History:  Procedure Laterality Date  . ABDOMINAL HYSTERECTOMY  2008  . CERVICAL SPINE SURGERY     Ruptured disc  . CESAREAN SECTION    . CHOLECYSTECTOMY    . JOINT REPLACEMENT    . OOPHORECTOMY  2008   Current Outpatient Prescriptions on File Prior to Visit  Medication Sig Dispense Refill  . acetaminophen (TYLENOL) 500 MG tablet Take 1,000 mg by mouth every 6 (six) hours as needed for moderate pain or headache.    Marland Kitchen atorvastatin (LIPITOR) 10 MG tablet Take 1 tablet (10 mg total) by mouth daily. 90 tablet 1  . Clobetasol Propionate 0.05 % shampoo Apply 5-10 mL daily as needed to scalp (Patient not taking: Reported  on 01/17/2017) 354 mL 8  . glucose blood test strip Use as instructed to check cbgs bid. 100 each 12  . ibuprofen (ADVIL,MOTRIN) 200 MG tablet Take 200 mg by mouth every 6 (six) hours as needed.    . Lancets MISC Check blood glucose twice a week. DX:250.00 100 each 0  . levothyroxine (SYNTHROID, LEVOTHROID) 137 MCG tablet Take 1 tablet (137 mcg total) by mouth daily before breakfast. 90 tablet 3  . lisinopril-hydrochlorothiazide (PRINZIDE,ZESTORETIC) 20-25 MG tablet Take 1 tablet by mouth daily. 90 tablet 3  . loratadine (CLARITIN) 10 MG tablet  Take 10 mg by mouth daily.    . metFORMIN (GLUCOPHAGE) 500 MG tablet Take 1 tablet (500 mg total) by mouth 2 (two) times daily with a meal. Then increase monthly as instructed up to 2 tabs bid. 180 tablet 0  . methocarbamol (ROBAXIN) 500 MG tablet Take 1-2 tablets (500-1,000 mg total) by mouth at bedtime as needed for muscle spasms. 180 tablet 1  . Multiple Vitamins-Minerals (MULTIVITAMIN WITH MINERALS) tablet Take 1 tablet by mouth daily.    Marland Kitchen triamcinolone cream (KENALOG) 0.1 % Apply 1 application topically 2 (two) times daily. 30 g 0   No current facility-administered medications on file prior to visit.    Allergies  Allergen Reactions  . Niacin Other (See Comments)    Turns red from chest up and body gets hot.  . Other Other (See Comments)    IBS sx caused by celery, avocado, whole milk, almonds  . Almond (Diagnostic) Itching  . Codeine Other (See Comments)    Becomes incoherent  . Pravastatin Other (See Comments)    Joint pain    Family History  Problem Relation Age of Onset  . Stroke Mother 39  . Heart disease Maternal Grandmother   . Diabetes Maternal Grandmother   . Heart disease Maternal Grandfather   . Cancer Paternal Grandmother   . Diabetes Other        1st degree relative  . Hypertension Other   . Pancreatic cancer Other   . Stroke Other        Grandmother  . Coronary artery disease Neg Hx        Premature or sudden cardiac death   Social History   Social History  . Marital status: Divorced    Spouse name: N/A  . Number of children: 2  . Years of education: N/A   Occupational History  . Land   Social History Main Topics  . Smoking status: Never Smoker  . Smokeless tobacco: Never Used  . Alcohol use 0.0 oz/week  . Drug use: No  . Sexual activity: No   Other Topics Concern  . Not on file   Social History Narrative   Marital status: divorced; not dating.       Children:  2 daughters (26, 1); no grandchildren.       Employment: [employer] supervisor      Tobacco; none since high school       Alcohol:  Weekends.   Does not exercise regularly          Depression screen [facility] 2/9 03/25/2017 11/16/2016 09/06/2016 08/01/2016 07/03/2016  Decreased Interest 0 0 0 0 0  Down, Depressed, Hopeless 0 0 0 0 0  PHQ - 2 Score 0 0 0 0 0    Review of Systems See hpi    Objective:   Physical Exam  Constitutional: She is oriented to person, place, and  time. She appears well-developed and well-nourished. No distress.  HENT:  Head: Normocephalic and atraumatic.  Right Ear: Tympanic membrane, external ear and ear canal normal.  Left Ear: Tympanic membrane, external ear and ear canal normal.  Nose: Nose normal. No mucosal edema or rhinorrhea.  Mouth/Throat: Uvula is midline, oropharynx is clear and moist and mucous membranes are normal. No posterior oropharyngeal erythema.  Eyes: Pupils are equal, round, and reactive to light. Conjunctivae and EOM are normal. Right eye exhibits no discharge. Left eye exhibits no discharge. No scleral icterus.  Neck: Normal range of motion. Neck supple. No thyromegaly present.  Cardiovascular: Normal rate, regular rhythm, normal heart sounds and intact distal pulses.   Pulmonary/Chest: Effort normal and breath sounds normal. No respiratory distress.  Abdominal: Soft. Bowel sounds are normal. There is no tenderness.  Genitourinary: Rectal exam shows external hemorrhoid, internal hemorrhoid, fissure and tenderness.  Genitourinary Comments: Midline proximal bleeding anal fissure with a large non-thrombosed external hemorrhoid on each side. Multiple non-inflamed internal hemorrhoids.  Musculoskeletal: She exhibits no edema.  Lymphadenopathy:    She has no cervical adenopathy.  Neurological: She is alert and oriented to person, place, and time. She has normal reflexes.  Skin: Skin is warm and dry. She is not diaphoretic. No erythema.  Psychiatric: She has a normal mood and affect. Her behavior is  normal.     BP (!) 108/50   Pulse 80   Temp 98.4 F (36.9 C)   Resp 16   Ht 5' 6"  (1.676 m)   Wt 223 lb 3.2 oz (101.2 kg)   SpO2 97%   BMI 36.03 kg/m   Results for orders placed or performed in visit on 03/25/17  POCT urinalysis dipstick  Result Value Ref Range   Color, UA yellow yellow   Clarity, UA clear clear   Glucose, UA negative negative mg/dL   Bilirubin, UA negative negative   Ketones, POC UA negative negative mg/dL   Spec Grav, UA >=1.030 (A) 1.010 - 1.025   Blood, UA trace-intact (A) negative   pH, UA 5.5 5.0 - 8.0   Protein Ur, POC negative negative mg/dL   Urobilinogen, UA 0.2 0.2 or 1.0 E.U./dL   Nitrite, UA Negative Negative   Leukocytes, UA Negative Negative   Skin tabs removed.    Assessment & Plan:  Needs mammogram!!!!!!1 Flu shot - done 10/11.   1. Annual physical exam   2. Encounter for screening mammogram for breast cancer   3. Essential hypertension   4. Hepatic steatosis   5. Hypothyroidism, unspecified type   6. Type 2 diabetes mellitus with diabetic polyneuropathy, without long-term current use of insulin ([place])   7. Hyperlipidemia LDL goal <100   8. Vitamin D deficiency   9. Medication monitoring encounter   10. Screening for breast cancer   11. Acquired skin tag     Orders Placed This Encounter  Procedures  . MM Digital Screening    Standing Status:   Future    Standing Expiration Date:   05/25/2018    Order Specific Question:   Reason for Exam (SYMPTOM  OR DIAGNOSIS REQUIRED)    Answer:   screening    Order Specific Question:   Is the patient pregnant?    Answer:   No    Order Specific Question:   Preferred imaging location?    Answer:   [facility]  . POCT urinalysis dipstick    Meds ordered this encounter  Medications  . Fluocinolone Acetonide 0.01 %  SHAM    Sig: Apply 1 application topically daily as needed.    Dispense:  120 mL    Refill:  2    Please let pt know cost but hold on file for her until needed.  .  fluocinolone (SYNALAR) 0.01 % external solution    Sig: Apply topically 2 (two) times daily. As needed    Dispense:  90 mL    Refill:  3    [name], M.D.  Primary Care at [facility] [address] [place], [place] [address] [phone] phone [phone] fax  03/27/17 10:36 PM

## 2017-03-26 LAB — LIPID PANEL
Chol/HDL Ratio: 7 ratio — ABNORMAL HIGH (ref 0.0–4.4)
Cholesterol, Total: 225 mg/dL — ABNORMAL HIGH (ref 100–199)
HDL: 32 mg/dL — ABNORMAL LOW (ref >39)
LDL Calculated: 139 mg/dL — ABNORMAL HIGH (ref 0–99)
Triglycerides: 269 mg/dL — ABNORMAL HIGH (ref 0–149)
VLDL Cholesterol Cal: 54 mg/dL — ABNORMAL HIGH (ref 5–40)

## 2017-03-26 LAB — TSH: TSH: 0.716 u[IU]/mL (ref 0.450–4.500)

## 2017-03-26 LAB — COMPREHENSIVE METABOLIC PANEL
ALT: 7 IU/L (ref 0–32)
AST: 9 IU/L (ref 0–40)
Albumin/Globulin Ratio: 2.1 (ref 1.2–2.2)
Albumin: 4.6 g/dL (ref 3.5–5.5)
Alkaline Phosphatase: 51 IU/L (ref 39–117)
BUN/Creatinine Ratio: 17 (ref 9–23)
BUN: 21 mg/dL (ref 6–24)
Bilirubin Total: 0.7 mg/dL (ref 0.0–1.2)
CO2: 28 mmol/L (ref 20–29)
Calcium: 9.5 mg/dL (ref 8.7–10.2)
Chloride: 99 mmol/L (ref 96–106)
Creatinine, Ser: 1.25 mg/dL — ABNORMAL HIGH (ref 0.57–1.00)
GFR calc Af Amer: 56 mL/min/{1.73_m2} — ABNORMAL LOW (ref >59)
GFR calc non Af Amer: 48 mL/min/{1.73_m2} — ABNORMAL LOW (ref >59)
Globulin, Total: 2.2 g/dL (ref 1.5–4.5)
Glucose: 108 mg/dL — ABNORMAL HIGH (ref 65–99)
Potassium: 4.5 mmol/L (ref 3.5–5.2)
Sodium: 142 mmol/L (ref 134–144)
Total Protein: 6.8 g/dL (ref 6.0–8.5)

## 2017-03-26 LAB — CBC WITH DIFFERENTIAL/PLATELET
Basophils Absolute: 0.1 10*3/uL (ref 0.0–0.2)
Basos: 2 %
EOS (ABSOLUTE): 0.3 10*3/uL (ref 0.0–0.4)
Eos: 3 %
Hematocrit: 39 % (ref 34.0–46.6)
Hemoglobin: 13.4 g/dL (ref 11.1–15.9)
Immature Grans (Abs): 0 10*3/uL (ref 0.0–0.1)
Immature Granulocytes: 0 %
Lymphocytes Absolute: 1.5 10*3/uL (ref 0.7–3.1)
Lymphs: 20 %
MCH: 29.6 pg (ref 26.6–33.0)
MCHC: 34.4 g/dL (ref 31.5–35.7)
MCV: 86 fL (ref 79–97)
Monocytes Absolute: 0.5 10*3/uL (ref 0.1–0.9)
Monocytes: 6 %
Neutrophils Absolute: 5 10*3/uL (ref 1.4–7.0)
Neutrophils: 69 %
Platelets: 288 10*3/uL (ref 150–379)
RBC: 4.52 x10E6/uL (ref 3.77–5.28)
RDW: 15 % (ref 12.3–15.4)
WBC: 7.3 10*3/uL (ref 3.4–10.8)

## 2017-03-26 LAB — VITAMIN D 25 HYDROXY (VIT D DEFICIENCY, FRACTURES): Vit D, 25-Hydroxy: 21.6 ng/mL — ABNORMAL LOW (ref 30.0–100.0)

## 2017-03-27 NOTE — Progress Notes (Signed)
Pt seen later in the day

## 2017-05-29 ENCOUNTER — Other Ambulatory Visit: Payer: Self-pay | Admitting: Family Medicine

## 2017-06-11 ENCOUNTER — Other Ambulatory Visit: Payer: Self-pay | Admitting: Family Medicine

## 2017-07-09 ENCOUNTER — Other Ambulatory Visit: Payer: Self-pay | Admitting: Family Medicine

## 2017-07-16 ENCOUNTER — Encounter: Payer: Self-pay | Admitting: Urgent Care

## 2017-07-16 ENCOUNTER — Ambulatory Visit (INDEPENDENT_AMBULATORY_CARE_PROVIDER_SITE_OTHER): Payer: Managed Care, Other (non HMO) | Admitting: Urgent Care

## 2017-07-16 VITALS — BP 117/75 | HR 95 | Temp 98.2°F | Resp 18 | Ht 66.0 in | Wt 223.2 lb

## 2017-07-16 DIAGNOSIS — R05 Cough: Secondary | ICD-10-CM

## 2017-07-16 DIAGNOSIS — H9203 Otalgia, bilateral: Secondary | ICD-10-CM | POA: Diagnosis not present

## 2017-07-16 DIAGNOSIS — J3489 Other specified disorders of nose and nasal sinuses: Secondary | ICD-10-CM | POA: Diagnosis not present

## 2017-07-16 DIAGNOSIS — R0981 Nasal congestion: Secondary | ICD-10-CM | POA: Diagnosis not present

## 2017-07-16 DIAGNOSIS — Z9109 Other allergy status, other than to drugs and biological substances: Secondary | ICD-10-CM | POA: Diagnosis not present

## 2017-07-16 DIAGNOSIS — J029 Acute pharyngitis, unspecified: Secondary | ICD-10-CM

## 2017-07-16 DIAGNOSIS — R059 Cough, unspecified: Secondary | ICD-10-CM

## 2017-07-16 MED ORDER — BENZONATATE 100 MG PO CAPS: 100.0000 mg | ORAL_CAPSULE | Freq: Three times a day (TID) | ORAL | 0 refills | Status: DC | PRN

## 2017-07-16 MED ORDER — PSEUDOEPHEDRINE HCL ER 120 MG PO TB12: 120.0000 mg | ORAL_TABLET | Freq: Two times a day (BID) | ORAL | 3 refills | Status: DC

## 2017-07-16 NOTE — Patient Instructions (Addendum)
Stop Flonase for now, maintain Allerclear. Hydrate well with at least 2 liters (1 gallon) of water daily. For sore throat and cough try using a honey-based tea. Use 3 teaspoons of honey with juice squeezed from half lemon. Place shaved pieces of ginger into 1/2-1 cup of water and warm over stove top. Then mix the ingredients and repeat every 4 hours as needed.    Upper Respiratory Infection, Adult Most upper respiratory infections (URIs) are a viral infection of the air passages leading to the lungs. A URI affects the nose, throat, and upper air passages. The most common type of URI is nasopharyngitis and is typically referred to as "the common cold." URIs run their course and usually go away on their own. Most of the time, a URI does not require medical attention, but sometimes a bacterial infection in the upper airways can follow a viral infection. This is called a secondary infection. Sinus and middle ear infections are common types of secondary upper respiratory infections. Bacterial pneumonia can also complicate a URI. A URI can worsen asthma and chronic obstructive pulmonary disease (COPD). Sometimes, these complications can require emergency medical care and may be life threatening. What are the causes? Almost all URIs are caused by viruses. A virus is a type of germ and can spread from one person to another. What increases the risk? You may be at risk for a URI if:  You smoke.  You have chronic heart or lung disease.  You have a weakened defense (immune) system.  You are very young or very old.  You have nasal allergies or asthma.  You work in crowded or poorly ventilated areas.  You work in health care facilities or schools.  What are the signs or symptoms? Symptoms typically develop 2-3 days after you come in contact with a cold virus. Most viral URIs last 7-10 days. However, viral URIs from the influenza virus (flu virus) can last 14-18 days and are typically more severe.  Symptoms may include:  Runny or stuffy (congested) nose.  Sneezing.  Cough.  Sore throat.  Headache.  Fatigue.  Fever.  Loss of appetite.  Pain in your forehead, behind your eyes, and over your cheekbones (sinus pain).  Muscle aches.  How is this diagnosed? Your health care provider may diagnose a URI by:  Physical exam.  Tests to check that your symptoms are not due to another condition such as: ? Strep throat. ? Sinusitis. ? Pneumonia. ? Asthma.  How is this treated? A URI goes away on its own with time. It cannot be cured with medicines, but medicines may be prescribed or recommended to relieve symptoms. Medicines may help:  Reduce your fever.  Reduce your cough.  Relieve nasal congestion.  Follow these instructions at home:  Take medicines only as directed by your health care provider.  Gargle warm saltwater or take cough drops to comfort your throat as directed by your health care provider.  Use a warm mist humidifier or inhale steam from a shower to increase air moisture. This may make it easier to breathe.  Drink enough fluid to keep your urine clear or pale yellow.  Eat soups and other clear broths and maintain good nutrition.  Rest as needed.  Return to work when your temperature has returned to normal or as your health care provider advises. You may need to stay home longer to avoid infecting others. You can also use a face mask and careful hand washing to prevent spread of the virus.  Increase the usage of your inhaler if you have asthma.  Do not use any tobacco products, including cigarettes, chewing tobacco, or electronic cigarettes. If you need help quitting, ask your health care provider. How is this prevented? The best way to protect yourself from getting a cold is to practice good hygiene.  Avoid oral or hand contact with people with cold symptoms.  Wash your hands often if contact occurs.  There is no clear evidence that vitamin C,  vitamin E, echinacea, or exercise reduces the chance of developing a cold. However, it is always recommended to get plenty of rest, exercise, and practice good nutrition. Contact a health care provider if:  You are getting worse rather than better.  Your symptoms are not controlled by medicine.  You have chills.  You have worsening shortness of breath.  You have brown or red mucus.  You have yellow or brown nasal discharge.  You have pain in your face, especially when you bend forward.  You have a fever.  You have swollen neck glands.  You have pain while swallowing.  You have white areas in the back of your throat. Get help right away if:  You have severe or persistent: ? Headache. ? Ear pain. ? Sinus pain. ? Chest pain.  You have chronic lung disease and any of the following: ? Wheezing. ? Prolonged cough. ? Coughing up blood. ? A change in your usual mucus.  You have a stiff neck.  You have changes in your: ? Vision. ? Hearing. ? Thinking. ? Mood. This information is not intended to replace advice given to you by your health care provider. Make sure you discuss any questions you have with your health care provider. Document Released: 11/07/2000 Document Revised: 01/15/2016 Document Reviewed: 08/19/2013 Elsevier Interactive Patient Education  [employer].

## 2017-07-16 NOTE — Progress Notes (Signed)
    MRN: [id number] DOB: Sep 27, 1960  Subjective:   Connie [name] is a 57 y.o. female presenting for 2 day history of sinus congestion, sinus pain, bilateral ear pain, ear fullness, sore throat, productive cough, sweats, subjective fever. Has tried APAP with some relief. Also uses Allerclear and Flonase for allergies daily. Denies smoking cigarettes. Denies history of asthma.  Review of Systems  Respiratory: Negative for hemoptysis, shortness of breath and wheezing.   Cardiovascular: Negative for chest pain.  Gastrointestinal: Negative for abdominal pain, nausea and vomiting.    Connie [name] has a current medication list which includes the following prescription(s): acetaminophen, atorvastatin, fluocinolone acetonide, glucose blood, ibuprofen, lancets, levothyroxine, lisinopril-hydrochlorothiazide, loratadine, metformin, methocarbamol, multivitamin with minerals, and triamcinolone cream. Also is allergic to niacin; other; almond (diagnostic); codeine; and pravastatin.  Connie [name]  has a past medical history of Allergic rhinitis, Anxiety, Arthritis, Diabetes mellitus without complication ([place]), Hyperlipidemia, Hypertension, Hypothyroid, and Lower extremity edema. Also  has a past surgical history that includes Cholecystectomy; Cervical spine surgery; Abdominal hysterectomy (2008); Oophorectomy (2008); Joint replacement; and Cesarean section.  Objective:   Vitals: BP 117/75   Pulse 95   Temp 98.2 F (36.8 C) (Oral)   Resp 18   Ht 5\' 6"  (1.676 m)   Wt 223 lb 3.2 oz (101.2 kg)   SpO2 98%   BMI 36.03 kg/m   Physical Exam  Constitutional: She is oriented to person, place, and time. She appears well-developed and well-nourished.  HENT:  TM's intact bilaterally, no effusions or erythema. Nasal turbinates pink, moist, nasal passages patent. Bilateral maxillary sinus tenderness. Oropharynx clear, mucous membranes moist.    Eyes: Right eye exhibits no discharge. Left eye exhibits no discharge.  Neck:  Normal range of motion. Neck supple.  Cardiovascular: Normal rate, regular rhythm and intact distal pulses. Exam reveals no gallop and no friction rub.  No murmur heard. Pulmonary/Chest: No respiratory distress. She has no wheezes. She has no rales.  Lymphadenopathy:    She has no cervical adenopathy.  Neurological: She is alert and oriented to person, place, and time.  Skin: Skin is warm and dry.  Psychiatric: She has a normal mood and affect.   Assessment and Plan :   Sinus pain  Sinus congestion  Acute ear pain, bilateral  Sore throat  Cough  Environmental allergies  Will manage as a viral URI. Use supportive care. Return-to-clinic precautions discussed, patient verbalized understanding.   [name], PA-C Primary Care at [facility] [phone] 07/16/2017  3:14 PM

## 2017-07-23 ENCOUNTER — Ambulatory Visit: Payer: Managed Care, Other (non HMO) | Admitting: Urgent Care

## 2017-07-23 ENCOUNTER — Encounter: Payer: Self-pay | Admitting: Urgent Care

## 2017-07-23 VITALS — BP 130/80 | HR 80 | Temp 98.4°F | Resp 17 | Ht 66.0 in | Wt 224.0 lb

## 2017-07-23 DIAGNOSIS — J3489 Other specified disorders of nose and nasal sinuses: Secondary | ICD-10-CM | POA: Diagnosis not present

## 2017-07-23 DIAGNOSIS — R0981 Nasal congestion: Secondary | ICD-10-CM | POA: Diagnosis not present

## 2017-07-23 DIAGNOSIS — Z9109 Other allergy status, other than to drugs and biological substances: Secondary | ICD-10-CM | POA: Diagnosis not present

## 2017-07-23 NOTE — Patient Instructions (Addendum)
Allergies, Adult An allergy is when your body's defense system (immune system) overreacts to an otherwise harmless substance (allergen) that you breathe in or eat or something that touches your skin. When you come into contact with something that you are allergic to, your immune system produces certain proteins (antibodies). These proteins cause cells to release chemicals (histamines) that trigger the symptoms of an allergic reaction. Allergies often affect the nasal passages (allergic rhinitis), eyes (allergic conjunctivitis), skin (atopic dermatitis), and stomach. Allergies can be mild or severe. Allergies cannot spread from person to person (are not contagious). They can develop at any age and may be outgrown. What increases the risk? You may be at greater risk of allergies if other people in your family have allergies. What are the signs or symptoms? Symptoms depend on what type of allergy you have. They may include:  Runny, stuffy nose.  Sneezing.  Itchy mouth, ears, or throat.  Postnasal drip.  Sore throat.  Itchy, red, watery, or puffy eyes.  Skin rash or hives.  Stomach pain.  Vomiting.  Diarrhea.  Bloating.  Wheezing or coughing.  People with a severe allergy to food, medicine, or an insect bite may have a life-threatening allergic reaction (anaphylaxis). Symptoms of anaphylaxis include:  Hives.  Itching.  Flushed face.  Swollen lips, tongue, or mouth.  Tight or swollen throat.  Chest pain or tightness in the chest.  Trouble breathing or shortness of breath.  Rapid heartbeat.  Dizziness or fainting.  Vomiting.  Diarrhea.  Pain in the abdomen.  How is this diagnosed? This condition is diagnosed based on:  Your symptoms.  Your family and medical history.  A physical exam.  You may need to see a health care provider who specializes in treating allergies (allergist). You may also have tests, including:  Skin tests to see which allergens are  causing your symptoms, such as: ? Skin prick test. In this test, your skin is pricked with a tiny needle and exposed to small amounts of possible allergens to see if your skin reacts. ? Intradermal skin test. In this test, a small amount of allergen is injected under your skin to see if your skin reacts. ? Patch test. In this test, a small amount of allergen is placed on your skin and then your skin is covered with a bandage. Your health care provider will check your skin after a couple of days to see if a rash has developed.  Blood tests.  Challenges tests. In this test, you inhale a small amount of allergen by mouth to see if you have an allergic reaction.  You may also be asked to:  Keep a food diary. A food diary is a record of all the foods and drinks you have in a day and any symptoms you experience.  Practice an elimination diet. An elimination diet involves eliminating specific foods from your diet and then adding them back in one by one to find out if a certain food causes an allergic reaction.  How is this treated? Treatment for allergies depends on your symptoms. Treatment may include:  Cold compresses to soothe itching and swelling.  Eye drops.  Nasal sprays.  Using a saline spray or container (neti pot) to flush out the nose (nasal irrigation). These methods can help clear away mucus and keep the nasal passages moist.  Using a humidifier.  Oral antihistamines or other medicines to block allergic reaction and inflammation.  Skin creams to treat rashes or itching.  Diet changes to  eliminate food allergy triggers.  Repeated exposure to tiny amounts of allergens to build up a tolerance and prevent future allergic reactions (immunotherapy). These include: ? Allergy shots. ? Oral treatment. This involves taking small doses of an allergen under the tongue (sublingual immunotherapy).  Emergency epinephrine injection (auto-injector) in case of an allergic emergency. This is  a self-injectable, pre-measured medicine that must be given within the first few minutes of a serious allergic reaction.  Follow these instructions at home:  Avoid known allergens whenever possible.  If you suffer from airborne allergens, wash out your nose daily. You can do this with a saline spray or a neti pot to flush out your nose (nasal irrigation).  Take over-the-counter and prescription medicines only as told by your health care provider.  Keep all follow-up visits as told by your health care provider. This is important.  If you are at risk of a severe allergic reaction (anaphylaxis), keep your auto-injector with you at all times.  If you have ever had anaphylaxis, wear a medical alert bracelet or necklace that states you have a severe allergy. Contact a health care provider if:  Your symptoms do not improve with treatment. Get help right away if:  You have symptoms of anaphylaxis, such as: ? Swollen mouth, tongue, or throat. ? Pain or tightness in your chest. ? Trouble breathing or shortness of breath. ? Dizziness or fainting. ? Severe abdominal pain, vomiting, or diarrhea. This information is not intended to replace advice given to you by your health care provider. Make sure you discuss any questions you have with your health care provider. Document Released: 08/07/2002 Document Revised: 09/12/2016 Document Reviewed: 11/30/2015 Elsevier Interactive Patient Education  2018 [place] Maintenance, Female Adopting a healthy lifestyle and getting preventive care can go a long way to promote health and wellness. Talk with your health care provider about what schedule of regular examinations is right for you. This is a good chance for you to check in with your provider about disease prevention and staying healthy. In between checkups, there are plenty of things you can do on your own. Experts have done a lot of research about which lifestyle changes and  preventive measures are most likely to keep you healthy. Ask your health care provider for more information. Weight and diet Eat a healthy diet  Be sure to include plenty of vegetables, fruits, low-fat dairy products, and lean protein.  Do not eat a lot of foods high in solid fats, added sugars, or salt.  Get regular exercise. This is one of the most important things you can do for your health. ? Most adults should exercise for at least 150 minutes each week. The exercise should increase your heart rate and make you sweat (moderate-intensity exercise). ? Most adults should also do strengthening exercises at least twice a week. This is in addition to the moderate-intensity exercise.  Maintain a healthy weight  Body mass index (BMI) is a measurement that can be used to identify possible weight problems. It estimates body fat based on height and weight. Your health care provider can help determine your BMI and help you achieve or maintain a healthy weight.  For females 82 years of age and older: ? A BMI below 18.5 is considered underweight. ? A BMI of 18.5 to 24.9 is normal. ? A BMI of 25 to 29.9 is considered overweight. ? A BMI of 30 and above is considered obese.  Watch levels of cholesterol and  blood lipids  You should start having your blood tested for lipids and cholesterol at 57 years of age, then have this test every 5 years.  You may need to have your cholesterol levels checked more often if: ? Your lipid or cholesterol levels are high. ? You are older than 57 years of age. ? You are at high risk for heart disease.  Cancer screening Lung Cancer  Lung cancer screening is recommended for adults 65-54 years old who are at high risk for lung cancer because of a history of smoking.  A yearly low-dose CT scan of the lungs is recommended for people who: ? Currently smoke. ? Have quit within the past 15 years. ? Have at least a 30-pack-year history of smoking. A pack year is  smoking an average of one pack of cigarettes a day for 1 year.  Yearly screening should continue until it has been 15 years since you quit.  Yearly screening should stop if you develop a health problem that would prevent you from having lung cancer treatment.  Breast Cancer  Practice breast self-awareness. This means understanding how your breasts normally appear and feel.  It also means doing regular breast self-exams. Let your health care provider know about any changes, no matter how small.  If you are in your 20s or 30s, you should have a clinical breast exam (CBE) by a health care provider every 1-3 years as part of a regular health exam.  If you are 88 or older, have a CBE every year. Also consider having a breast X-ray (mammogram) every year.  If you have a family history of breast cancer, talk to your health care provider about genetic screening.  If you are at high risk for breast cancer, talk to your health care provider about having an MRI and a mammogram every year.  Breast cancer gene (BRCA) assessment is recommended for women who have family members with BRCA-related cancers. BRCA-related cancers include: ? Breast. ? Ovarian. ? Tubal. ? Peritoneal cancers.  Results of the assessment will determine the need for genetic counseling and BRCA1 and BRCA2 testing.  Cervical Cancer Your health care provider may recommend that you be screened regularly for cancer of the pelvic organs (ovaries, uterus, and vagina). This screening involves a pelvic examination, including checking for microscopic changes to the surface of your cervix (Pap test). You may be encouraged to have this screening done every 3 years, beginning at age 81.  For women ages 30-65, health care providers may recommend pelvic exams and Pap testing every 3 years, or they may recommend the Pap and pelvic exam, combined with testing for human papilloma virus (HPV), every 5 years. Some types of HPV increase your risk  of cervical cancer. Testing for HPV may also be done on women of any age with unclear Pap test results.  Other health care providers may not recommend any screening for nonpregnant women who are considered low risk for pelvic cancer and who do not have symptoms. Ask your health care provider if a screening pelvic exam is right for you.  If you have had past treatment for cervical cancer or a condition that could lead to cancer, you need Pap tests and screening for cancer for at least 20 years after your treatment. If Pap tests have been discontinued, your risk factors (such as having a new sexual partner) need to be reassessed to determine if screening should resume. Some women have medical problems that increase the chance of getting cervical  cancer. In these cases, your health care provider may recommend more frequent screening and Pap tests.  Colorectal Cancer  This type of cancer can be detected and often prevented.  Routine colorectal cancer screening usually begins at 57 years of age and continues through 57 years of age.  Your health care provider may recommend screening at an earlier age if you have risk factors for colon cancer.  Your health care provider may also recommend using home test kits to check for hidden blood in the stool.  A small camera at the end of a tube can be used to examine your colon directly (sigmoidoscopy or colonoscopy). This is done to check for the earliest forms of colorectal cancer.  Routine screening usually begins at age 47.  Direct examination of the colon should be repeated every 5-10 years through 57 years of age. However, you may need to be screened more often if early forms of precancerous polyps or small growths are found.  Skin Cancer  Check your skin from head to toe regularly.  Tell your health care provider about any new moles or changes in moles, especially if there is a change in a mole's shape or color.  Also tell your health care  provider if you have a mole that is larger than the size of a pencil eraser.  Always use sunscreen. Apply sunscreen liberally and repeatedly throughout the day.  Protect yourself by wearing long sleeves, pants, a wide-brimmed hat, and sunglasses whenever you are outside.  Heart disease, diabetes, and high blood pressure  High blood pressure causes heart disease and increases the risk of stroke. High blood pressure is more likely to develop in: ? People who have blood pressure in the high end of the normal range (130-139/85-89 mm Hg). ? People who are overweight or obese. ? People who are African American.  If you are 58-71 years of age, have your blood pressure checked every 3-5 years. If you are 46 years of age or older, have your blood pressure checked every year. You should have your blood pressure measured twice-once when you are at a hospital or clinic, and once when you are not at a hospital or clinic. Record the average of the two measurements. To check your blood pressure when you are not at a hospital or clinic, you can use: ? An automated blood pressure machine at a pharmacy. ? A home blood pressure monitor.  If you are between 28 years and 5 years old, ask your health care provider if you should take aspirin to prevent strokes.  Have regular diabetes screenings. This involves taking a blood sample to check your fasting blood sugar level. ? If you are at a normal weight and have a low risk for diabetes, have this test once every three years after 57 years of age. ? If you are overweight and have a high risk for diabetes, consider being tested at a younger age or more often. Preventing infection Hepatitis B  If you have a higher risk for hepatitis B, you should be screened for this virus. You are considered at high risk for hepatitis B if: ? You were born in a country where hepatitis B is common. Ask your health care provider which countries are considered high risk. ? Your  parents were born in a high-risk country, and you have not been immunized against hepatitis B (hepatitis B vaccine). ? You have HIV or AIDS. ? You use needles to inject street drugs. ? You live with  someone who has hepatitis B. ? You have had sex with someone who has hepatitis B. ? You get hemodialysis treatment. ? You take certain medicines for conditions, including cancer, organ transplantation, and autoimmune conditions.  Hepatitis C  Blood testing is recommended for: ? Everyone born from [birth year] through 1965. ? Anyone with known risk factors for hepatitis C.  Sexually transmitted infections (STIs)  You should be screened for sexually transmitted infections (STIs) including gonorrhea and chlamydia if: ? You are sexually active and are younger than 58 years of age. ? You are older than 57 years of age and your health care provider tells you that you are at risk for this type of infection. ? Your sexual activity has changed since you were last screened and you are at an increased risk for chlamydia or gonorrhea. Ask your health care provider if you are at risk.  If you do not have HIV, but are at risk, it may be recommended that you take a prescription medicine daily to prevent HIV infection. This is called pre-exposure prophylaxis (PrEP). You are considered at risk if: ? You are sexually active and do not regularly use condoms or know the HIV status of your partner(s). ? You take drugs by injection. ? You are sexually active with a partner who has HIV.  Talk with your health care provider about whether you are at high risk of being infected with HIV. If you choose to begin PrEP, you should first be tested for HIV. You should then be tested every 3 months for as long as you are taking PrEP. Pregnancy  If you are premenopausal and you may become pregnant, ask your health care provider about preconception counseling.  If you may become pregnant, take 400 to 800 micrograms (mcg) of folic  acid every day.  If you want to prevent pregnancy, talk to your health care provider about birth control (contraception). Osteoporosis and menopause  Osteoporosis is a disease in which the bones lose minerals and strength with aging. This can result in serious bone fractures. Your risk for osteoporosis can be identified using a bone density scan.  If you are 72 years of age or older, or if you are at risk for osteoporosis and fractures, ask your health care provider if you should be screened.  Ask your health care provider whether you should take a calcium or vitamin D supplement to lower your risk for osteoporosis.  Menopause may have certain physical symptoms and risks.  Hormone replacement therapy may reduce some of these symptoms and risks. Talk to your health care provider about whether hormone replacement therapy is right for you. Follow these instructions at home:  Schedule regular health, dental, and eye exams.  Stay current with your immunizations.  Do not use any tobacco products including cigarettes, chewing tobacco, or electronic cigarettes.  If you are pregnant, do not drink alcohol.  If you are breastfeeding, limit how much and how often you drink alcohol.  Limit alcohol intake to no more than 1 drink per day for nonpregnant women. One drink equals 12 ounces of beer, 5 ounces of wine, or 1 ounces of hard liquor.  Do not use street drugs.  Do not share needles.  Ask your health care provider for help if you need support or information about quitting drugs.  Tell your health care provider if you often feel depressed.  Tell your health care provider if you have ever been abused or do not feel safe at home. This information  is not intended to replace advice given to you by your health care provider. Make sure you discuss any questions you have with your health care provider. Document Released: 11/27/2010 Document Revised: 10/20/2015 Document Reviewed:  02/15/2015 Elsevier Interactive Patient Education  2018 [employer].     IF you received an x-ray today, you will receive an invoice from [facility]. Please contact [facility] at [phone] with questions or concerns regarding your invoice.   IF you received labwork today, you will receive an invoice from [facility]. Please contact [facility] at [phone] with questions or concerns regarding your invoice.   Our billing staff will not be able to assist you with questions regarding bills from these companies.  You will be contacted with the lab results as soon as they are available. The fastest way to get your results is to activate your My Chart account. Instructions are located on the last page of this paperwork. If you have not heard from [place] regarding the results in 2 weeks, please contact this office.

## 2017-07-23 NOTE — Progress Notes (Signed)
    MRN: [id number] DOB: 1960/10/31  Subjective:   Connie [name] is a 57 y.o. female presenting for follow up on sinus pain, congestion, cough. Reports that she is much better with supportive care. She has hydrated well, used Sudafed as needed. She maintained her Allerclear. Denies fever, sinus pain, ear pain, sore throat.   [name] has a current medication list which includes the following prescription(s): acetaminophen, atorvastatin, benzonatate, fluocinolone acetonide, glucose blood, ibuprofen, lancets, levothyroxine, lisinopril-hydrochlorothiazide, loratadine, metformin, methocarbamol, multivitamin with minerals, pseudoephedrine, and triamcinolone cream. Also is allergic to niacin; other; almond (diagnostic); codeine; and pravastatin.  [name]  has a past medical history of Allergic rhinitis, Anxiety, Arthritis, Diabetes mellitus without complication ([place]), Hyperlipidemia, Hypertension, Hypothyroid, and Lower extremity edema. Also  has a past surgical history that includes Cholecystectomy; Cervical spine surgery; Abdominal hysterectomy (2008); Oophorectomy (2008); Joint replacement; and Cesarean section.  Objective:   Vitals: BP 130/80   Pulse 80   Temp 98.4 F (36.9 C) (Oral)   Resp 17   Ht 5\' 6"  (1.676 m)   Wt 224 lb (101.6 kg)   SpO2 98%   BMI 36.15 kg/m   Physical Exam  Constitutional: She is oriented to person, place, and time. She appears well-developed and well-nourished.  HENT:  TM's intact bilaterally, no effusions or erythema. Nasal turbinates pink, moist, nasal passages patent. No sinus tenderness. Oropharynx clear, mucous membranes moist.    Cardiovascular: Normal rate.  Pulmonary/Chest: Effort normal.  Neurological: She is alert and oriented to person, place, and time.   Assessment and Plan :   Sinus pain  Sinus congestion  Environmental allergies  Improved, maintain allergy medications, restart Flonase once she is completely over her viral URI. Return-to-clinic  precautions discussed, patient verbalized understanding. Otherwise, follow up as needed.  [name], PA-C [facility] [phone] 07/23/2017 8:51 AM

## 2017-07-29 ENCOUNTER — Other Ambulatory Visit: Payer: Self-pay

## 2017-07-29 ENCOUNTER — Ambulatory Visit: Payer: Managed Care, Other (non HMO) | Admitting: Family Medicine

## 2017-07-29 ENCOUNTER — Encounter: Payer: Self-pay | Admitting: Family Medicine

## 2017-07-29 ENCOUNTER — Ambulatory Visit (INDEPENDENT_AMBULATORY_CARE_PROVIDER_SITE_OTHER): Payer: Managed Care, Other (non HMO)

## 2017-07-29 VITALS — BP 104/66 | HR 76 | Temp 97.8°F | Resp 18 | Ht 66.0 in | Wt 223.6 lb

## 2017-07-29 DIAGNOSIS — E1142 Type 2 diabetes mellitus with diabetic polyneuropathy: Secondary | ICD-10-CM | POA: Diagnosis not present

## 2017-07-29 DIAGNOSIS — M25561 Pain in right knee: Secondary | ICD-10-CM

## 2017-07-29 LAB — GLUCOSE, POCT (MANUAL RESULT ENTRY): POC Glucose: 99 mg/dl (ref 70–99)

## 2017-07-29 MED ORDER — PREDNISONE 10 MG PO TABS: ORAL_TABLET | ORAL | 0 refills | Status: DC

## 2017-07-29 NOTE — Progress Notes (Signed)
Subjective:  By signing my name below, I, Connie [name], attest that this documentation has been prepared under the direction and in the presence of [name], MD. Electronically Signed: Moises [name], [place]. 07/29/2017 , 5:53 PM .  Patient was seen in Room 2 .   Patient ID: Connie [name], female    DOB: 10/18/1960, 57 y.o.   MRN: [id number] Chief Complaint  Patient presents with  . Knee Pain    x1 day,right knee, pt states pain started as a dull pain. Pt states she couldn't pressure on knee this morning. Pt states she tried icy hot, bluemu, and tylenol. Pt states she helped daughter move on Friday and then went to work the day after.   HPI Connie [name] is a 57 y.o. female who presents to Primary Care at [facility] complaining of dull, right knee pain that started yesterday afternoon. Patient states she was sitting and started feeling soreness over the medial aspect of her right knee. She tried walking, but had pain putting pressure walking on it. She was fixing her breakfast, sat down and felt excruciating pain when putting pressure in it. She noticed fluid and swelling around her right knee. She mentions feeling a small knot in the medial aspect as well. She noticed more pain when flexing her knee. She denies knee buckling or giving way under her.   She reports helping her daughter move 2 days ago, and went to work the next day. She also informs doing more squatting and kneeling to clean her house. She also remarks squatting down to lift something up and believes that may have contributed to the pain as well. She denies knowledge of hitting her right knee against something. She states she is limping around when walking. She took a muscle relaxant last night, and also tylenol once this morning with some improvement.   She mentions she's been checking her [name] sugar, running around 97-99, and never above 100.   Past Medical History:  Diagnosis Date  . Allergic rhinitis   . Anxiety   .  Arthritis   . Diabetes mellitus without complication ([place])   . Hyperlipidemia   . Hypertension   . Hypothyroid   . Lower extremity edema    Past Surgical History:  Procedure Laterality Date  . ABDOMINAL HYSTERECTOMY  2008  . CERVICAL SPINE SURGERY     Ruptured disc  . CESAREAN SECTION    . CHOLECYSTECTOMY    . JOINT REPLACEMENT    . OOPHORECTOMY  2008   Prior to Admission medications   Medication Sig Start Date End Date Taking? Authorizing Provider  acetaminophen (TYLENOL) 500 MG tablet Take 1,000 mg by mouth every 6 (six) hours as needed for moderate pain or headache.   Yes [provider]  atorvastatin (LIPITOR) 10 MG tablet Take 1 tablet (10 mg total) by mouth daily. 09/08/16  Yes [name], MD  benzonatate (TESSALON) 100 MG capsule Take 1-2 capsules (100-200 mg total) by mouth 3 (three) times daily as needed. 07/16/17  Yes [name], PA-C  glucose [name] test strip Use as instructed to check cbgs bid. 09/06/14  Yes [name], MD  ibuprofen (ADVIL,MOTRIN) 200 MG tablet Take 200 mg by mouth every 6 (six) hours as needed.   Yes [provider]  Lancets MISC Check [name] glucose twice a week. DX:250.00 04/15/13  Yes [name], MD  levothyroxine (SYNTHROID, LEVOTHROID) 137 MCG tablet TAKE ONE TABLET BY MOUTH ONCE DAILY BEFORE BREAKFAST 06/11/17  Yes [name], MD  lisinopril-hydrochlorothiazide (PRINZIDE,ZESTORETIC) 20-25 MG tablet TAKE ONE TABLET BY MOUTH ONCE DAILY 05/30/17  Yes [name], MD  loratadine (CLARITIN) 10 MG tablet Take 10 mg by mouth daily.   Yes [provider]  metFORMIN (GLUCOPHAGE) 500 MG tablet TAKE 1 TABLET BY MOUTH TWICE DAILY WITH A MEAL THEN INCREASE MONTHLY AS INSTRUCTED UP TO 2 TABLETS TWICE DAILY 07/10/17  Yes [name], MD  methocarbamol (ROBAXIN) 500 MG tablet Take 1-2 tablets (500-1,000 mg total) by mouth at bedtime as needed for muscle spasms. 09/06/16  Yes [name], MD  pseudoephedrine (SUDAFED 12 HOUR) 120 MG 12 hr  tablet Take 1 tablet (120 mg total) by mouth 2 (two) times daily. 07/16/17  Yes [name], PA-C  triamcinolone cream (KENALOG) 0.1 % Apply 1 application topically 2 (two) times daily. 08/01/16  Yes Sagardia, Ines Bloomer, MD  Fluocinolone Acetonide 0.01 % SHAM Apply 1 application topically daily as needed. Patient not taking: Reported on 07/29/2017 03/25/17   [name], MD  Multiple Vitamins-Minerals (MULTIVITAMIN WITH MINERALS) tablet Take 1 tablet by mouth daily.    [provider]   Allergies  Allergen Reactions  . Niacin Other (See Comments)    Turns red from chest up and body gets hot.  . Other Other (See Comments)    IBS sx caused by celery, avocado, whole milk, almonds  . Almond (Diagnostic) Itching  . Codeine Other (See Comments)    Becomes incoherent  . Pravastatin Other (See Comments)    Joint pain    Family History  Problem Relation Age of Onset  . Stroke Mother 66  . Heart disease Maternal Grandmother   . Diabetes Maternal Grandmother   . Heart disease Maternal Grandfather   . Cancer Paternal Grandmother   . Diabetes Other        1st degree relative  . Hypertension Other   . Pancreatic cancer Other   . Stroke Other        Grandmother  . Coronary artery disease Neg Hx        Premature or sudden cardiac death   Social History   Socioeconomic History  . Marital status: Divorced    Spouse name: None  . Number of children: 2  . Years of education: None  . Highest education level: None  Social Needs  . Financial resource strain: None  . Food insecurity - worry: None  . Food insecurity - inability: None  . Transportation needs - medical: None  . Transportation needs - non-medical: None  Occupational History  . Occupation: Aeronautical engineer    CommentAstronomer  Tobacco Use  . Smoking status: Never Smoker  . Smokeless tobacco: Never Used  Substance and Sexual Activity  . Alcohol use: Yes    Alcohol/week: 0.0 oz  . Drug use: No  . Sexual activity:  No    Birth control/protection: None  Other Topics Concern  . None  Social History Narrative   Marital status: divorced; not dating.       Children:  2 daughters (26, 65); no grandchildren.      Employment: [employer] supervisor      Tobacco; none since high school       Alcohol:  Weekends.   Does not exercise regularly      Depression screen [facility] 2/9 07/29/2017 07/23/2017 03/25/2017 11/16/2016 09/06/2016  Decreased Interest 0 0 0 0 0  Down, Depressed, Hopeless 0 0 0 0 0  PHQ -  2 Score 0 0 0 0 0    Review of Systems  Constitutional: Negative for chills, fatigue, fever and unexpected weight change.  Respiratory: Negative for cough.   Gastrointestinal: Negative for constipation, diarrhea, nausea and vomiting.  Musculoskeletal: Positive for arthralgias (right knee) and joint swelling (right knee).  Skin: Negative for rash and wound.  Neurological: Negative for dizziness, weakness and headaches.       Objective:   Physical Exam  Constitutional: She is oriented to person, place, and time. She appears well-developed and well-nourished. No distress.  HENT:  Head: Normocephalic and atraumatic.  Eyes: EOM are normal. Pupils are equal, round, and reactive to light.  Neck: Neck supple.  Cardiovascular: Normal rate.  Pulmonary/Chest: Effort normal. No respiratory distress.  Musculoskeletal: Normal range of motion.  Right knee: no tenderness over lateral jointline, severe tenderness over medial jointline, severe pain with extension to about 20 degrees, and flexion to about 100 degrees  Neurological: She is alert and oriented to person, place, and time.  Skin: Skin is warm and dry.  Psychiatric: She has a normal mood and affect. Her behavior is normal.  Nursing note and vitals reviewed.   BP 104/66 (BP Location: Left Arm, Patient Position: Sitting, Cuff Size: Large)   Pulse 76   Temp 97.8 F (36.6 C) (Oral)   Resp 18   Ht 5\' 6"  (1.676 m)   Wt 223 lb 9.6 oz (101.4 kg)   SpO2 99%   BMI 36.09  kg/m   Dg Knee Complete 4 Views Right  Result Date: 07/29/2017 CLINICAL DATA:  Acute pain of the right knee. Medial right knee pain for 1 day. EXAM: RIGHT KNEE - COMPLETE 4+ VIEW COMPARISON:  Right knee radiographs 08/27/2012. FINDINGS: No evidence of fracture, dislocation, or joint effusion. No evidence of arthropathy or other focal bone abnormality. Soft tissues are unremarkable. IMPRESSION: Negative right knee radiographs. No acute abnormality or significant interval change. Electronically Signed   By: San Morelle M.D.   On: 07/29/2017 18:03       Assessment & Plan:   1. Acute pain of right knee   2. Type 2 diabetes mellitus with diabetic polyneuropathy, without long-term current use of insulin ([place])   Placed in right knee medial web brace w/ some symptomatic improvement. 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 Cont RICE x several days then gradually return to normal activity and wean off brace when pain free.  Encouraged to wear brace during knee aggressive activities in future.   If sxs continue, RTC for repeat exam so we can decide if PT, MRI, and/or ortho is next step.  Orders Placed This Encounter  Procedures  . DG Knee Complete 4 Views Right    Standing Status:   Future    Number of Occurrences:   1    Standing Expiration Date:   07/29/2018    Order Specific Question:   Reason for Exam (SYMPTOM  OR DIAGNOSIS REQUIRED)    Answer:   1d of insiduous onset medial right knee pain, steadily worsening, now difficulty bearing weight, ttp over medial joint line    Order Specific Question:   Is the patient pregnant?    Answer:   No    Order Specific Question:   Preferred imaging location?    Answer:   External  . Basic metabolic panel    Order Specific Question:   Has the patient fasted?    Answer:   No  . Sedimentation Rate  .  Uric acid  . Hemoglobin A1c  . Microalbumin/Creatinine Ratio, Urine  . POCT glucose (manual entry)    Meds ordered this encounter  Medications  .  predniSONE (DELTASONE) 10 MG tablet    Sig: 6-5-4-3-2-1 tabs po qd    Dispense:  21 tablet    Refill:  0    I personally performed the services described in this documentation, which was scribed in my presence. The recorded information has been reviewed and considered, and addended by me as needed.   [name], M.D.  Primary Care at [facility] [address] [place], [place] [address] [phone] phone [phone] fax  07/31/17 4:52 AM

## 2017-07-29 NOTE — Patient Instructions (Addendum)
We recommend that you schedule a mammogram for breast cancer screening. Typically, you do not need a referral to do this. Please contact a local imaging center to schedule your mammogram.  [facility] - [phone]  *ask for the Radiology Department [facility] ([place]) - [phone] or [phone]  [facility] - [phone] [place] [phone] [facility] - [phone]  *ask for the [facility] - [phone]  *ask for the Radiology Department [facility] - [phone]  *ask for the [place] - [phone]   IF you received an x-ray today, you will receive an invoice from [facility] Radiology. Please contact [facility] Radiology at [phone] with questions or concerns regarding your invoice.   IF you received labwork today, you will receive an invoice from [facility]. Please contact [facility] at [phone] with questions or concerns regarding your invoice.   Our billing staff will not be able to assist you with questions regarding bills from these companies.  You will be contacted with the lab results as soon as they are available. The fastest way to get your results is to activate your My Chart account. Instructions are located on the last page of this paperwork. If you have not heard from [place] regarding the results in 2 weeks, please contact this office.     Elastic Bandage and RICE What does an elastic bandage do? Elastic bandages come in different shapes and sizes. They generally provide support to your injury and reduce swelling while you are healing, but they can perform different functions. Your health care provider will help you to decide what is best for your protection, recovery, or rehabilitation following an injury. What are some general tips for using an elastic bandage?  Use the bandage as directed by the  maker of the bandage that you are using.  Do not wrap the bandage too tightly. This may cut off the circulation in the arm or leg in the area below the bandage. ? If part of your body beyond the bandage becomes blue, numb, cold, swollen, or is more painful, your bandage is most likely too tight. If this occurs, remove your bandage and reapply it more loosely.  See your health care provider if the bandage seems to be making your problems worse rather than better.  An elastic bandage should be removed and reapplied every 3-4 hours or as directed by your health care provider. What is RICE? The routine care of many injuries includes rest, ice, compression, and elevation (RICE therapy). Rest Rest is required to allow your body to heal. Generally, you can resume your routine activities when you are comfortable and have been given permission by your health care provider. Ice Icing your injury helps to keep the swelling down and it reduces pain. Do not apply ice directly to your skin.  Put ice in a plastic bag.  Place a towel between your skin and the bag.  Leave the ice on for 20 minutes, 2-3 times per day.  Do this for as long as you are directed by your health care provider. Compression Compression helps to keep swelling down, gives support, and helps with discomfort. Compression may be done with an elastic bandage. Elevation Elevation helps to reduce swelling and it decreases pain. If possible, your injured area should be placed at or above the level of your heart or the center of your chest. When should I seek medical  care? You should seek medical care if:  You have persistent pain and swelling.  Your symptoms are getting worse rather than improving.  These symptoms may indicate that further evaluation or further X-rays are needed. Sometimes, X-rays may not show a small broken bone (fracture) until a number of days later. Make a follow-up appointment with your health care provider. Ask  when your X-ray results will be ready. Make sure that you get your X-ray results. When should I seek immediate medical care? You should seek immediate medical care if:  You have a sudden onset of severe pain at or below the area of your injury.  You develop redness or increased swelling around your injury.  You have tingling or numbness at or below the area of your injury that does not improve after you remove the elastic bandage.  This information is not intended to replace advice given to you by your health care provider. Make sure you discuss any questions you have with your health care provider. Document Released: 11/03/2001 Document Revised: 04/09/2016 Document Reviewed: 12/28/2013 Elsevier Interactive Patient Education  2018 [employer].  Combined Knee Ligament Sprain A ligament is a type of fibrous tissue that connects one bone to another bone. There are four ligaments in your knee. Together, they provide stability for your knee joint. A combined knee ligament sprain is an injury in which more than one knee ligament is severely stretched or torn. This kind of injury is also called an injury to multiple structures of the knee. What are the causes? This condition may be caused by:  A direct hit (trauma) to the knee.  Overextending the knee.  Twisting the knee.  What increases the risk? This condition is more likely to develop in people who:  Participate in certain sports, including: ? Contact sports, such as football, rugby, and lacrosse. ? Sports that take place on uneven ground, such as soccer and cross country. ? Sports that involve quick changes in position, such as basketball, dancing, gymnastics, and skiing.  Have poor strength or flexibility.  Are overweight.  Have overly flexible joints (joint laxity).  What are the signs or symptoms? Symptoms of this condition include:  Swelling.  Pain with movement.  Pain when the injured area is  touched.  Instability.  A popping sound that happens at the time of injury.  Inability to stand or use the injured knee to support (bear) one's body weight.  How is this diagnosed? This condition is usually diagnosed with a physical exam. You may also have imaging tests, such as:  An X-ray.  MRI.  How is this treated? This condition may be treated with:  Ice applied to the affected area.  Medicines for pain.  Placing the knee in a brace or splint to prevent movement.  Physical therapy to help strengthen and stabilize the knee joint.  Surgery to reconstruct a torn ligament. This may be done in severe cases.  Follow these instructions at home: If you have a splint or brace:  Wear the splint or brace as told by your health care provider. Remove it only as told by your health care provider.  Loosen the splint or brace if your toes become numb and tingle, or if they turn cold and blue.  Follow instructions from your health care provider about how to care for your splint or brace.  Keep the splint or brace clean. Managing pain, stiffness, and swelling  If directed, apply ice to the injured area. ? Put ice in  a plastic bag. ? Place a towel between your skin and the bag. ? Leave the ice on for 20 minutes, 2-3 times per day.  Move your toes often to avoid stiffness and to lessen swelling.  Raise (elevate) the injured area above the level of your heart while you are sitting or lying down. Driving  Do not drive or operate heavy machinery while taking prescription pain medicine.  Ask your health care provider when it is safe to drive if you have a splint or brace on your knee. Activity  Return to your normal activities as told by your health care provider. Ask your health care provider what activities are safe for you.  Perform exercises daily as told by your health care provider or physical therapist. General instructions  Take over-the-counter and prescription  medicines only as told by your health care provider.  Do not take baths, swim, or use a hot tub until your health care provider approves. Ask your health care provider if you can take showers. You may only be allowed to take sponge baths for bathing.  Do not use the injured limb to support your body weight until your health care provider says that you can.  Keep all follow-up visits as told by your health care provider. This is important. Contact a health care provider if:  Your symptoms do not improve.  Your symptoms get worse.  You develop tingling or numbness in the area of your injury. Get help right away if:  You develop severe numbness or tingling in your leg or foot.  Your foot turns blue, white, or gray, and it feels cold. This information is not intended to replace advice given to you by your health care provider. Make sure you discuss any questions you have with your health care provider. Document Released: 05/14/2005 Document Revised: 10/20/2015 Document Reviewed: 07/16/2014 Elsevier Interactive Patient Education  [employer].

## 2017-07-29 NOTE — Progress Notes (Signed)
d 

## 2017-07-30 LAB — BASIC METABOLIC PANEL
BUN/Creatinine Ratio: 18 (ref 9–23)
BUN: 19 mg/dL (ref 6–24)
CO2: 27 mmol/L (ref 20–29)
Calcium: 9.5 mg/dL (ref 8.7–10.2)
Chloride: 97 mmol/L (ref 96–106)
Creatinine, Ser: 1.06 mg/dL — ABNORMAL HIGH (ref 0.57–1.00)
GFR calc Af Amer: 68 mL/min/{1.73_m2} (ref >59)
GFR calc non Af Amer: 59 mL/min/{1.73_m2} — ABNORMAL LOW (ref >59)
Glucose: 104 mg/dL — ABNORMAL HIGH (ref 65–99)
Potassium: 4.5 mmol/L (ref 3.5–5.2)
Sodium: 140 mmol/L (ref 134–144)

## 2017-07-30 LAB — SEDIMENTATION RATE: Sed Rate: 7 mm/hr (ref 0–40)

## 2017-07-30 LAB — MICROALBUMIN / CREATININE URINE RATIO
Creatinine, Urine: 71.6 mg/dL
Microalb/Creat Ratio: <4.2 mg/g creat (ref 0.0–30.0)
Microalbumin, Urine: <3 ug/mL

## 2017-07-30 LAB — HEMOGLOBIN A1C
Est. average glucose Bld gHb Est-mCnc: 134 mg/dL
Hgb A1c MFr Bld: 6.3 % — ABNORMAL HIGH (ref 4.8–5.6)

## 2017-07-30 LAB — URIC ACID: Uric Acid: 8.5 mg/dL — ABNORMAL HIGH (ref 2.5–7.1)

## 2017-09-23 ENCOUNTER — Encounter: Payer: Self-pay | Admitting: Family Medicine

## 2017-09-23 ENCOUNTER — Ambulatory Visit (INDEPENDENT_AMBULATORY_CARE_PROVIDER_SITE_OTHER): Payer: Managed Care, Other (non HMO) | Admitting: Family Medicine

## 2017-09-23 ENCOUNTER — Other Ambulatory Visit: Payer: Self-pay

## 2017-09-23 VITALS — BP 126/80 | HR 72 | Temp 98.3°F | Resp 18 | Ht 66.0 in | Wt 225.4 lb

## 2017-09-23 DIAGNOSIS — M2550 Pain in unspecified joint: Secondary | ICD-10-CM

## 2017-09-23 DIAGNOSIS — Z6836 Body mass index (BMI) 36.0-36.9, adult: Secondary | ICD-10-CM

## 2017-09-23 DIAGNOSIS — M25561 Pain in right knee: Secondary | ICD-10-CM | POA: Diagnosis not present

## 2017-09-23 DIAGNOSIS — E039 Hypothyroidism, unspecified: Secondary | ICD-10-CM | POA: Diagnosis not present

## 2017-09-23 DIAGNOSIS — E790 Hyperuricemia without signs of inflammatory arthritis and tophaceous disease: Secondary | ICD-10-CM

## 2017-09-23 DIAGNOSIS — E0800 Diabetes mellitus due to underlying condition with hyperosmolarity without nonketotic hyperglycemic-hyperosmolar coma (NKHHC): Secondary | ICD-10-CM | POA: Diagnosis not present

## 2017-09-23 DIAGNOSIS — N183 Chronic kidney disease, stage 3 unspecified: Secondary | ICD-10-CM

## 2017-09-23 DIAGNOSIS — E559 Vitamin D deficiency, unspecified: Secondary | ICD-10-CM

## 2017-09-23 DIAGNOSIS — I10 Essential (primary) hypertension: Secondary | ICD-10-CM

## 2017-09-23 DIAGNOSIS — R252 Cramp and spasm: Secondary | ICD-10-CM | POA: Diagnosis not present

## 2017-09-23 DIAGNOSIS — K760 Fatty (change of) liver, not elsewhere classified: Secondary | ICD-10-CM

## 2017-09-23 DIAGNOSIS — E785 Hyperlipidemia, unspecified: Secondary | ICD-10-CM

## 2017-09-23 MED ORDER — POTASSIUM CHLORIDE CRYS ER 20 MEQ PO TBCR: 20.0000 meq | EXTENDED_RELEASE_TABLET | Freq: Every day | ORAL | 0 refills | Status: DC

## 2017-09-23 MED ORDER — FUROSEMIDE 40 MG PO TABS: 40.0000 mg | ORAL_TABLET | Freq: Every day | ORAL | 0 refills | Status: DC

## 2017-09-23 MED ORDER — ATORVASTATIN CALCIUM 10 MG PO TABS: 10.0000 mg | ORAL_TABLET | Freq: Every day | ORAL | 1 refills | Status: DC

## 2017-09-23 MED ORDER — METFORMIN HCL 500 MG PO TABS: 500.0000 mg | ORAL_TABLET | Freq: Every day | ORAL | 3 refills | Status: DC

## 2017-09-23 MED ORDER — LISINOPRIL 20 MG PO TABS: 20.0000 mg | ORAL_TABLET | Freq: Every day | ORAL | 1 refills | Status: DC

## 2017-09-23 NOTE — Patient Instructions (Addendum)
Start vitamin D 2000u a day.  Consider trying a vitamin B complex and 400iu of vitamin E before bed at night. Consider a magnesium supplement at night.    IF you received an x-ray today, you will receive an invoice from [facility]. Please contact [facility] at [phone] with questions or concerns regarding your invoice.   IF you received labwork today, you will receive an invoice from [place]. Please contact [facility] at [phone] with questions or concerns regarding your invoice.   Our billing staff will not be able to assist you with questions regarding bills from these companies.  You will be contacted with the lab results as soon as they are available. The fastest way to get your results is to activate your My Chart account. Instructions are located on the last page of this paperwork. If you have not heard from [place] regarding the results in 2 weeks, please contact this office.    Leg Cramps Leg cramps occur when a muscle or muscles tighten and you have no control over this tightening (involuntary muscle contraction). Muscle cramps can develop in any muscle, but the most common place is in the calf muscles of the leg. Those cramps can occur during exercise or when you are at rest. Leg cramps are painful, and they may last for a few seconds to a few minutes. Cramps may return several times before they finally stop. Usually, leg cramps are not caused by a serious medical problem. In many cases, the cause is not known. Some common causes include:  Overexertion.  Overuse from repetitive motions, or doing the same thing over and over.  Remaining in a certain position for a long period of time.  Improper preparation, form, or technique while performing a sport or an activity.  Dehydration.  Injury.  Side effects of some medicines.  Abnormally low levels of the salts and ions in your blood (electrolytes), especially potassium and calcium. These levels could be low  if you are taking water pills (diuretics) or if you are pregnant.  Follow these instructions at home: Watch your condition for any changes. Taking the following actions may help to lessen any discomfort that you are feeling:  Stay well-hydrated. Drink enough fluid to keep your urine clear or pale yellow.  Try massaging, stretching, and relaxing the affected muscle. Do this for several minutes at a time.  For tight or tense muscles, use a warm towel, heating pad, or hot shower water directed to the affected area.  If you are sore or have pain after a cramp, applying ice to the affected area may relieve discomfort. ? Put ice in a plastic bag. ? Place a towel between your skin and the bag. ? Leave the ice on for 20 minutes, 2-3 times per day.  Avoid strenuous exercise for several days if you have been having frequent leg cramps.  Make sure that your diet includes the essential minerals for your muscles to work normally.  Take medicines only as directed by your health care provider.  Contact a health care provider if:  Your leg cramps get more severe or more frequent, or they do not improve over time.  Your foot becomes cold, numb, or blue. This information is not intended to replace advice given to you by your health care provider. Make sure you discuss any questions you have with your health care provider. Document Released: 06/21/2004 Document Revised: 10/20/2015 Document Reviewed: 04/21/2014 Elsevier Interactive Patient Education  2018 [place] Diet Purines are  compounds that affect the level of uric acid in your body. A low-purine diet is a diet that is low in purines. Eating a low-purine diet can prevent the level of uric acid in your body from getting too high and causing gout or kidney stones or both. What do I need to know about this diet?  Choose low-purine foods. Examples of low-purine foods are listed in the next section.  Drink plenty of fluids,  especially water. Fluids can help remove uric acid from your body. Try to drink 8-16 cups (1.9-3.8 L) a day.  Limit foods high in fat, especially saturated fat, as fat makes it harder for the body to get rid of uric acid. Foods high in saturated fat include pizza, cheese, ice cream, whole milk, fried foods, and gravies. Choose foods that are lower in fat and lean sources of protein. Use olive oil when cooking as it contains healthy fats that are not high in saturated fat.  Limit alcohol. Alcohol interferes with the elimination of uric acid from your body. If you are having a gout attack, avoid all alcohol.  Keep in mind that different people's bodies react differently to different foods. You will probably learn over time which foods do or do not affect you. If you discover that a food tends to cause your gout to flare up, avoid eating that food. You can more freely enjoy foods that do not cause problems. If you have any questions about a food item, talk to your dietitian or health care provider. Which foods are low, moderate, and high in purines? The following is a list of foods that are low, moderate, and high in purines. You can eat any amount of the foods that are low in purines. You may be able to have small amounts of foods that are moderate in purines. Ask your health care provider how much of a food moderate in purines you can have. Avoid foods high in purines. Grains  Foods low in purines: Enriched white bread, pasta, rice, cake, cornbread, popcorn.  Foods moderate in purines: Whole-grain breads and cereals, wheat germ, bran, oatmeal. Uncooked oatmeal. Dry wheat bran or wheat germ.  Foods high in purines: Pancakes, [place] toast, biscuits, muffins. Vegetables  Foods low in purines: All vegetables, except those that are moderate in purines.  Foods moderate in purines: Asparagus, cauliflower, spinach, mushrooms, green peas. Fruits  All fruits are low in purines. Meats and other Protein  Foods  Foods low in purines: Eggs, nuts, peanut butter.  Foods moderate in purines: 80-90% lean beef, lamb, veal, pork, poultry, fish, eggs, peanut butter, nuts. Crab, lobster, oysters, and shrimp. Cooked dried beans, peas, and lentils.  Foods high in purines: Anchovies, sardines, herring, mussels, tuna, codfish, scallops, trout, and haddock. Berniece Salines. Organ meats (such as liver or kidney). Tripe. Game meat. Goose. Sweetbreads. Dairy  All dairy foods are low in purines. Low-fat and fat-free dairy products are best because they are low in saturated fat. Beverages  Drinks low in purines: Water, carbonated beverages, tea, coffee, cocoa.  Drinks moderate in purines: Soft drinks and other drinks sweetened with high-fructose corn syrup. Juices. To find whether a food or drink is sweetened with high-fructose corn syrup, look at the ingredients list.  Drinks high in purines: Alcoholic beverages (such as beer). Condiments  Foods low in purines: Salt, herbs, olives, pickles, relishes, vinegar.  Foods moderate in purines: Butter, margarine, oils, mayonnaise. Fats and Oils  Foods low in purines: All types, except gravies and sauces made  with meat.  Foods high in purines: Gravies and sauces made with meat. Other Foods  Foods low in purines: Sugars, sweets, gelatin. Cake. Soups made without meat.  Foods moderate in purines: Meat-based or fish-based soups, broths, or bouillons. Foods and drinks sweetened with high-fructose corn syrup.  Foods high in purines: High-fat desserts (such as ice cream, cookies, cakes, pies, doughnuts, and chocolate). Contact your dietitian for more information on foods that are not listed here. This information is not intended to replace advice given to you by your health care provider. Make sure you discuss any questions you have with your health care provider. Document Released: 09/08/2010 Document Revised: 10/20/2015 Document Reviewed: 04/20/2013 Elsevier Interactive  Patient Education  2017 [employer].   There are some easy diet adjustments that you can make to lower your blood uric acid level and thereby hopefully never have to suffer from another gout flair (or at least less frequently).  You should avoid alcohol, drink plenty of water, and try to follow a "low purine" diet as your body produces uric acid when it breaks down prurines-substances that are found naturally in your body, as well as in certain foods such as organ meats, anchioves, herring, asparagus, and mushrooms. Also, increasing your diet in certain foods that may lower uric acid levels is a pretty safe way to decrease your likelihood of gout so consider drinking coffee (regular and/or decaf), eating fruits with Vitamin C in them such as citrus fruits, strawberries, broccoli,  brussel sprouts, papaya, and cantaloupe (though megadoses of vitamin C supplements may do the opposite and increase your body's uric acid levels), and/or eating more cherries and other dark-colored fruits, such as blackberries, blueberries, purple grapes and raspberries. In addition, getting plenty of vitamin A though yellow fruits, or dark green/yellow vegetables at least every other day is good.  Other general diet guidelines for people with gout who need to lower their blood uric acid levels are as follows:  Drink 8 to 16 cups ( about 2 to 4 liters) of fluid each day, with at least half being water. Eat a moderate amount of protein, preferably from healthy sources, such as low-fat or fat-free dairy, tofu, eggs, and nut butters. Limit your daily intake of meat, fish, and poultry to 4 to 6 ounces. Avoid high fat meats and desserts. Decrease your intake of shellfish, beef, lamb, pork, eggs and cheese. Avoid drastic weight reduction or fasting.  If weight loss is desired lose it over a period of several months.

## 2017-09-23 NOTE — Progress Notes (Addendum)
 Subjective:  By signing my name below, I, Tsung-Kai Tsai, attest that this documentation has been prepared under the direction and in the presence of Eva Shaw, MD. Electronically Signed: Tsung-Kai Tsai, Scribe. 09/23/2017 , 8:51 AM .  Patient was seen in Room 1 .   Patient ID: Connie [name], female    DOB: 01/26/1961, 56 y.o.   MRN: [id number] Chief Complaint  Patient presents with  . Medication Refill    Lisinopril-hydrochlorothiazide 20-25 MG   HPI Connie [name] is a 56 y.o. female who presents to Primary Care at [place] requesting medication refill.   DMII - mild Labs done 1 month ago with A1C of 6.3.  She takes metformin 500mg QD. Nml microalb last month.  CKD III Renal function improved from prior with eGFR 48 to 59.   She had elevated uric acid; her right acute knee pain of unknown etiology could've been gout.   HLD Her last lipids improved from prior, but still above goal of LDL 139. Patient is rx'd Lipitor 10mg but she hasn't been taking Lipitor regularly, but no new muscle aches.  She is fasting today.   Hypothyroid She is on levothyroxine 137mcg, with TSH done 6 months prior being in range, and has been in range for 2.5 years now.   HTN: She drinks rare half glass of soda, mostly drinking green tea (unsweetend) and water but still has edema and puffy every summer.  She is doing well on lisinopril-HCTZ.  She took sudafed last week due to sinus pressure coming on.  Muscle cramps She reports having some calf muscle cramps, but has relief with 2 teaspoon of mustard and used to get more regular massages. She also tries to stretch out her legs when calf muscle cramps get really bad, and can last up to an hour. She still has plenty of robaxin, as she only takes it as needed.  Arthralgias She notes having occasional joint pains but mentions sitting down a lot with work.  She also notes sciatic pain but has relief with tylenol.    She has been taking multivitamins.     Past Medical History:  Diagnosis Date  . Allergic rhinitis   . Anxiety   . Arthritis   . Diabetes mellitus without complication (HCC)   . Hyperlipidemia   . Hypertension   . Hypothyroid   . Lower extremity edema    Past Surgical History:  Procedure Laterality Date  . ABDOMINAL HYSTERECTOMY  2008  . CERVICAL SPINE SURGERY     Ruptured disc  . CESAREAN SECTION    . CHOLECYSTECTOMY    . JOINT REPLACEMENT    . OOPHORECTOMY  2008   Prior to Admission medications   Medication Sig Start Date End Date Taking? Authorizing Provider  acetaminophen (TYLENOL) 500 MG tablet Take 1,000 mg by mouth every 6 (six) hours as needed for moderate pain or headache.    [provider]  atorvastatin (LIPITOR) 10 MG tablet Take 1 tablet (10 mg total) by mouth daily. 09/08/16   Shaw, Eva N, MD  benzonatate (TESSALON) 100 MG capsule Take 1-2 capsules (100-200 mg total) by mouth 3 (three) times daily as needed. 07/16/17   Mani, Mario, PA-C  Fluocinolone Acetonide 0.01 % SHAM Apply 1 application topically daily as needed. Patient not taking: Reported on 07/29/2017 03/25/17   Shaw, Eva N, MD  glucose blood test strip Use as instructed to check cbgs bid. 09/06/14   Shaw, Eva N, MD  ibuprofen (ADVIL,MOTRIN) 200   MG tablet Take 200 mg by mouth every 6 (six) hours as needed.    [provider]  Lancets MISC Check blood glucose twice a week. DX:250.00 04/15/13   Shaw, Eva N, MD  levothyroxine (SYNTHROID, LEVOTHROID) 137 MCG tablet TAKE ONE TABLET BY MOUTH ONCE DAILY BEFORE BREAKFAST 06/11/17   Shaw, Eva N, MD  lisinopril-hydrochlorothiazide (PRINZIDE,ZESTORETIC) 20-25 MG tablet TAKE ONE TABLET BY MOUTH ONCE DAILY 05/30/17   Shaw, Eva N, MD  loratadine (CLARITIN) 10 MG tablet Take 10 mg by mouth daily.    [provider]  metFORMIN (GLUCOPHAGE) 500 MG tablet TAKE 1 TABLET BY MOUTH TWICE DAILY WITH A MEAL THEN INCREASE MONTHLY AS INSTRUCTED UP TO 2 TABLETS TWICE DAILY 07/10/17   Shaw, Eva N, MD   methocarbamol (ROBAXIN) 500 MG tablet Take 1-2 tablets (500-1,000 mg total) by mouth at bedtime as needed for muscle spasms. 09/06/16   Shaw, Eva N, MD  Multiple Vitamins-Minerals (MULTIVITAMIN WITH MINERALS) tablet Take 1 tablet by mouth daily.    [provider]  predniSONE (DELTASONE) 10 MG tablet 6-5-4-3-2-1 tabs po qd 07/29/17   Shaw, Eva N, MD  pseudoephedrine (SUDAFED 12 HOUR) 120 MG 12 hr tablet Take 1 tablet (120 mg total) by mouth 2 (two) times daily. 07/16/17   Mani, Mario, PA-C  triamcinolone cream (KENALOG) 0.1 % Apply 1 application topically 2 (two) times daily. 08/01/16   Sagardia, Miguel Jose, MD   Allergies  Allergen Reactions  . Niacin Other (See Comments)    Turns red from chest up and body gets hot.  . Other Other (See Comments)    IBS sx caused by celery, avocado, whole milk, almonds  . Almond (Diagnostic) Itching  . Codeine Other (See Comments)    Becomes incoherent  . Pravastatin Other (See Comments)    Joint pain    Family History  Problem Relation Age of Onset  . Stroke Mother 55  . Heart disease Maternal Grandmother   . Diabetes Maternal Grandmother   . Heart disease Maternal Grandfather   . Cancer Paternal Grandmother   . Diabetes Other        1st degree relative  . Hypertension Other   . Pancreatic cancer Other   . Stroke Other        Grandmother  . Coronary artery disease Neg Hx        Premature or sudden cardiac death   Social History   Socioeconomic History  . Marital status: Divorced    Spouse name: Not on file  . Number of children: 2  . Years of education: Not on file  . Highest education level: Not on file  Occupational History  . Occupation: Accounts receivable    Comment: Manager  Social Needs  . Financial resource strain: Not on file  . Food insecurity:    Worry: Not on file    Inability: Not on file  . Transportation needs:    Medical: Not on file    Non-medical: Not on file  Tobacco Use  . Smoking status: Never Smoker   . Smokeless tobacco: Never Used  Substance and Sexual Activity  . Alcohol use: Yes    Alcohol/week: 0.0 oz  . Drug use: No  . Sexual activity: Never    Birth control/protection: None  Lifestyle  . Physical activity:    Days per week: Not on file    Minutes per session: Not on file  . Stress: Not on file  Relationships  . Social connections:      Talks on phone: Not on file    Gets together: Not on file    Attends religious service: Not on file    Active member of club or organization: Not on file    Attends meetings of clubs or organizations: Not on file    Relationship status: Not on file  Other Topics Concern  . Not on file  Social History Narrative   Marital status: divorced; not dating.       Children:  2 daughters (26, 21); no grandchildren.      Employment: [employer] supervisor      Tobacco; none since high school       Alcohol:  Weekends.   Does not exercise regularly      Depression screen PHQ 2/9 09/23/2017 07/29/2017 07/23/2017 03/25/2017 11/16/2016  Decreased Interest 0 0 0 0 0  Down, Depressed, Hopeless 0 0 0 0 0  PHQ - 2 Score 0 0 0 0 0    Review of Systems  Constitutional: Negative for fatigue and unexpected weight change.  Respiratory: Negative for chest tightness and shortness of breath.   Cardiovascular: Negative for chest pain, palpitations and leg swelling.  Gastrointestinal: Negative for abdominal pain and blood in stool.  Musculoskeletal: Positive for myalgias (leg cramps).  Neurological: Negative for dizziness, syncope, light-headedness and headaches.       Objective:   Physical Exam  Constitutional: She is oriented to person, place, and time. She appears well-developed and well-nourished. No distress.  HENT:  Head: Normocephalic and atraumatic.  Eyes: Pupils are equal, round, and reactive to light. EOM are normal.  Neck: Neck supple.  Cardiovascular: Normal rate and regular rhythm.  Pulmonary/Chest: Effort normal and breath sounds normal. No  respiratory distress.  Musculoskeletal: Normal range of motion.  Neurological: She is alert and oriented to person, place, and time.  Skin: Skin is warm and dry.  Psychiatric: She has a normal mood and affect. Her behavior is normal.  Nursing note and vitals reviewed.   BP 126/80 (BP Location: Left Arm, Patient Position: Sitting, Cuff Size: Large)   Pulse 72   Temp 98.3 F (36.8 C) (Oral)   Resp 18   Ht 5' 6" (1.676 m)   Wt 225 lb 6.4 oz (102.2 kg)   SpO2 99%   BMI 36.38 kg/m      Assessment & Plan:   1. Hyperlipidemia LDL goal <100 - agrees to start trial of statin - atorvastatin 10  2. Essential hypertension  - notes more swelling over the summer no matter how much she watches salt intake and pushes fluid - will dc hctz 25 and step up to lasix 40 w/ K 20 for swelling - cont lisinopril 20 - RTC for quick BP check and repeat bmp in 2-3 mos  3. Diabetes mellitus due to underlying condition with hyperosmolarity without coma, unspecified whether long term insulin use (HCC) - cont metformin 500  4. Hypothyroidism, unspecified type - tsh cont to be normal - cont levothyroxine 137 - refill x 1 yr whenever requested  5. Muscle cramping - maybe switching off hctz will help - cont rare prn robaxine  6. Vitamin D deficiency - slow improvement - still to low today but close to normal.  7. Acute pain of right knee  - resolved - could have been gout as no obvious injury, hyperesthetic, elev uric acid at time though inflammatory levels were relatively normal. Reviewed low uric acid diet and hydration which pt already largely complies w/. Could be hctz.    8. Hepatic steatosis - all prior LFTs normal - seen on imaging  9. Class 2 severe obesity due to excess calories with serious comorbidity and body mass index (BMI) of 36.0 to 36.9 in adult [facility]) - has lost 20 lbs in the past 1.5 yrs  10. Arthralgia of multiple joints   11. Elevated uric acid in blood   12. Stage 3 chronic kidney disease ([place]) -  definitely worsened 1 yr prior but steady since - for the past yr eGFR steadily 48-52      Orders Placed This Encounter  Procedures  . Lipid panel    Order Specific Question:   Has the patient fasted?    Answer:   Yes  . Comprehensive metabolic panel    Order Specific Question:   Has the patient fasted?    Answer:   Yes  . TSH  . VITAMIN D 25 Hydroxy (Vit-D Deficiency, Fractures)  . VITAMIN D 25 Hydroxy (Vit-D Deficiency, Fractures)    Meds ordered this encounter  Medications  . atorvastatin (LIPITOR) 10 MG tablet    Sig: Take 1 tablet (10 mg total) by mouth daily.    Dispense:  90 tablet    Refill:  1  . metFORMIN (GLUCOPHAGE) 500 MG tablet    Sig: Take 1 tablet (500 mg total) by mouth daily with breakfast.    Dispense:  90 tablet    Refill:  3    Please consider 90 day supplies to promote better adherence  . lisinopril (PRINIVIL,ZESTRIL) 20 MG tablet    Sig: Take 1 tablet (20 mg total) by mouth daily.    Dispense:  90 tablet    Refill:  1  . furosemide (LASIX) 40 MG tablet    Sig: Take 1 tablet (40 mg total) by mouth daily.    Dispense:  90 tablet    Refill:  0  . potassium chloride SA (K-DUR,KLOR-CON) 20 MEQ tablet    Sig: Take 1 tablet (20 mEq total) by mouth daily.    Dispense:  90 tablet    Refill:  0    I personally performed the services described in this documentation, which was scribed in my presence. The recorded information has been reviewed and considered, and addended by me as needed.   [name], M.D.  Primary Care at [facility] [address] [place], [place] [address] [phone] phone [phone] fax  09/25/17 3:03 PM

## 2017-09-24 LAB — COMPREHENSIVE METABOLIC PANEL
ALT: 9 IU/L (ref 0–32)
AST: 16 IU/L (ref 0–40)
Albumin/Globulin Ratio: 2.1 (ref 1.2–2.2)
Albumin: 4.6 g/dL (ref 3.5–5.5)
Alkaline Phosphatase: 48 IU/L (ref 39–117)
BUN/Creatinine Ratio: 18 (ref 9–23)
BUN: 21 mg/dL (ref 6–24)
Bilirubin Total: 0.5 mg/dL (ref 0.0–1.2)
CO2: 24 mmol/L (ref 20–29)
Calcium: 9.2 mg/dL (ref 8.7–10.2)
Chloride: 101 mmol/L (ref 96–106)
Creatinine, Ser: 1.18 mg/dL — ABNORMAL HIGH (ref 0.57–1.00)
GFR calc Af Amer: 60 mL/min/{1.73_m2} (ref >59)
GFR calc non Af Amer: 52 mL/min/{1.73_m2} — ABNORMAL LOW (ref >59)
Globulin, Total: 2.2 g/dL (ref 1.5–4.5)
Glucose: 104 mg/dL — ABNORMAL HIGH (ref 65–99)
Potassium: 4.4 mmol/L (ref 3.5–5.2)
Sodium: 142 mmol/L (ref 134–144)
Total Protein: 6.8 g/dL (ref 6.0–8.5)

## 2017-09-24 LAB — LIPID PANEL
Chol/HDL Ratio: 7.2 ratio — ABNORMAL HIGH (ref 0.0–4.4)
Cholesterol, Total: 244 mg/dL — ABNORMAL HIGH (ref 100–199)
HDL: 34 mg/dL — ABNORMAL LOW (ref >39)
LDL Calculated: 157 mg/dL — ABNORMAL HIGH (ref 0–99)
Triglycerides: 267 mg/dL — ABNORMAL HIGH (ref 0–149)
VLDL Cholesterol Cal: 53 mg/dL — ABNORMAL HIGH (ref 5–40)

## 2017-09-24 LAB — TSH: TSH: 0.549 u[IU]/mL (ref 0.450–4.500)

## 2017-09-24 LAB — VITAMIN D 25 HYDROXY (VIT D DEFICIENCY, FRACTURES): Vit D, 25-Hydroxy: 27.5 ng/mL — ABNORMAL LOW (ref 30.0–100.0)

## 2017-09-25 ENCOUNTER — Encounter: Payer: Self-pay | Admitting: Family Medicine

## 2017-09-25 DIAGNOSIS — E559 Vitamin D deficiency, unspecified: Secondary | ICD-10-CM | POA: Insufficient documentation

## 2017-09-25 DIAGNOSIS — R252 Cramp and spasm: Secondary | ICD-10-CM | POA: Insufficient documentation

## 2017-09-25 DIAGNOSIS — N183 Chronic kidney disease, stage 3 unspecified: Secondary | ICD-10-CM | POA: Insufficient documentation

## 2017-09-25 DIAGNOSIS — M2550 Pain in unspecified joint: Secondary | ICD-10-CM | POA: Insufficient documentation

## 2017-09-25 DIAGNOSIS — E790 Hyperuricemia without signs of inflammatory arthritis and tophaceous disease: Secondary | ICD-10-CM | POA: Insufficient documentation

## 2017-09-25 DIAGNOSIS — Z6836 Body mass index (BMI) 36.0-36.9, adult: Secondary | ICD-10-CM

## 2017-09-25 HISTORY — DX: Body mass index (BMI) 36.0-36.9, adult: Z68.36

## 2017-09-25 HISTORY — DX: Morbid (severe) obesity due to excess calories: E66.01

## 2017-10-18 NOTE — Telephone Encounter (Signed)
error 

## 2017-11-21 ENCOUNTER — Other Ambulatory Visit: Payer: Self-pay

## 2017-11-21 ENCOUNTER — Ambulatory Visit: Payer: BLUE CROSS/BLUE SHIELD | Admitting: Family Medicine

## 2017-11-21 ENCOUNTER — Encounter: Payer: Self-pay | Admitting: Family Medicine

## 2017-11-21 VITALS — BP 128/82 | HR 68 | Temp 98.0°F | Resp 16 | Ht 67.32 in | Wt 221.0 lb

## 2017-11-21 DIAGNOSIS — N183 Chronic kidney disease, stage 3 unspecified: Secondary | ICD-10-CM

## 2017-11-21 DIAGNOSIS — E785 Hyperlipidemia, unspecified: Secondary | ICD-10-CM

## 2017-11-21 DIAGNOSIS — E1142 Type 2 diabetes mellitus with diabetic polyneuropathy: Secondary | ICD-10-CM

## 2017-11-21 DIAGNOSIS — D229 Melanocytic nevi, unspecified: Secondary | ICD-10-CM

## 2017-11-21 DIAGNOSIS — E559 Vitamin D deficiency, unspecified: Secondary | ICD-10-CM

## 2017-11-21 LAB — POCT GLYCOSYLATED HEMOGLOBIN (HGB A1C): Hemoglobin A1C: 6.3 % — AB (ref 4.0–5.6)

## 2017-11-21 MED ORDER — FLUOCINOLONE ACETONIDE SCALP 0.01 % EX OIL: 1.0000 "application " | TOPICAL_OIL | Freq: Two times a day (BID) | CUTANEOUS | 4 refills | Status: AC

## 2017-11-21 MED ORDER — FLUOCINOLONE ACETONIDE 0.01 % EX SOLN: Freq: Two times a day (BID) | CUTANEOUS | 4 refills | Status: AC

## 2017-11-21 NOTE — Patient Instructions (Signed)
     IF you received an x-ray today, you will receive an invoice from [facility]. Please contact [facility] at [phone] with questions or concerns regarding your invoice.   IF you received labwork today, you will receive an invoice from [facility]. Please contact [facility] at [phone] with questions or concerns regarding your invoice.   Our billing staff will not be able to assist you with questions regarding bills from these companies.  You will be contacted with the lab results as soon as they are available. The fastest way to get your results is to activate your My Chart account. Instructions are located on the last page of this paperwork. If you have not heard from us regarding the results in 2 weeks, please contact this office.     
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
## 2017-11-22 LAB — COMPREHENSIVE METABOLIC PANEL
ALT: 14 IU/L (ref 0–32)
AST: 16 IU/L (ref 0–40)
Albumin/Globulin Ratio: 2.9 — ABNORMAL HIGH (ref 1.2–2.2)
Albumin: 5.3 g/dL (ref 3.5–5.5)
Alkaline Phosphatase: 51 IU/L (ref 39–117)
BUN/Creatinine Ratio: 20 (ref 9–23)
BUN: 26 mg/dL — ABNORMAL HIGH (ref 6–24)
Bilirubin Total: 0.7 mg/dL (ref 0.0–1.2)
CO2: 26 mmol/L (ref 20–29)
Calcium: 10 mg/dL (ref 8.7–10.2)
Chloride: 98 mmol/L (ref 96–106)
Creatinine, Ser: 1.29 mg/dL — ABNORMAL HIGH (ref 0.57–1.00)
GFR calc Af Amer: 53 mL/min/{1.73_m2} — ABNORMAL LOW (ref >59)
GFR calc non Af Amer: 46 mL/min/{1.73_m2} — ABNORMAL LOW (ref >59)
Globulin, Total: 1.8 g/dL (ref 1.5–4.5)
Glucose: 88 mg/dL (ref 65–99)
Potassium: 4.3 mmol/L (ref 3.5–5.2)
Sodium: 142 mmol/L (ref 134–144)
Total Protein: 7.1 g/dL (ref 6.0–8.5)

## 2017-11-22 LAB — VITAMIN D 25 HYDROXY (VIT D DEFICIENCY, FRACTURES): Vit D, 25-Hydroxy: 36.1 ng/mL (ref 30.0–100.0)

## 2018-01-06 ENCOUNTER — Telehealth: Payer: Self-pay | Admitting: Family Medicine

## 2018-01-06 ENCOUNTER — Other Ambulatory Visit: Payer: Self-pay | Admitting: *Deleted

## 2018-01-06 MED ORDER — FUROSEMIDE 40 MG PO TABS: 40.0000 mg | ORAL_TABLET | Freq: Every day | ORAL | 0 refills | Status: DC

## 2018-01-06 MED ORDER — POTASSIUM CHLORIDE CRYS ER 20 MEQ PO TBCR: 20.0000 meq | EXTENDED_RELEASE_TABLET | Freq: Every day | ORAL | 0 refills | Status: DC

## 2018-01-06 NOTE — Telephone Encounter (Signed)
Rx refilled per protocol- BMP 11/21/17

## 2018-01-06 NOTE — Telephone Encounter (Signed)
Copied from [place] [id number]. Topic: Quick Communication - Rx Refill/Question >> Jan 06, 2018  2:39 PM Nils Flack, Marland Kitchen wrote: Medication: potassium chloride SA (K-DUR,KLOR-CON) 20 MEQ tablet and furosemide (LASIX) 40 MG tablet Has the patient contacted their pharmacy? Yes.   (Agent: If no, request that the patient contact the pharmacy for the refill.) (Agent: If yes, when and what did the pharmacy advise?)  Preferred Pharmacy (with phone number or street name): [facility] on [address] is not getting response   Agent: Please be advised that RX refills may take up to 3 business days. We ask that you follow-up with your pharmacy.

## 2018-02-27 ENCOUNTER — Telehealth: Payer: Self-pay | Admitting: Family Medicine

## 2018-02-27 NOTE — Telephone Encounter (Signed)
Dr Brigitte Pulse please advise.  Spoke with [facility] pt received lisinopril- hctz on 05/30/17, 08/25/17, 11/23/17 and 02/18/18 from a hard copy sent in. I advised electronic script sent on 09/23/17 for lisinopril 20 mg  #90 with 1 refill- [facility] and pt has been receiving the comb med.  Pt is wanting to know if she should be taking the combo or the plain.  Unable to reach pt via phone left message to return my call.   Dgaddy, CMA

## 2018-02-27 NOTE — Telephone Encounter (Signed)
Copied from [place] [id number]. Topic: Quick Communication - Rx Refill/Question >> Feb 27, 2018 12:48 PM Reyne Dumas L wrote: Medication:  Pt states that she picked up her lisinopril (PRINIVIL,ZESTRIL) 20 MG tablet at the pharmacy and was given the medicaiton with HCTZ in it.  Pt states she was taken off of that and wants to make sure the right thing is being called in. Pt uses  [facility] ([facility]), [place] [phone] (Phone) [phone] (Fax)

## 2018-03-03 NOTE — Telephone Encounter (Signed)
So it appears that I wrote the lisinopril 20mg  rx on 09/23/17 - when I go back to the OV note from that day, under HTN in the assessment it states: "will dc hctz 25 and step up to lasix 40 w/ K 20 for swelling - cont lisinopril 20" So yes, if she has bene taking lasix 40 w/ K 20 - then we want her to be on the lisinopril 20 plain (no hctz). If she never got or is no longer needing the lasix 40/K20 (note states we stepped up the diuretic effectiveness as she was having worsening pedal edema the time) so if edema has resolved and she is not on lasix, then ok to switch back to the lisinopril-hctz 20-25.  Please let me know how her edema is and if she is taking the lasix so we can make sure that we get her med list corrected to reflect what she is actually taking and get the appropriate refills sent in for her.

## 2018-03-04 ENCOUNTER — Other Ambulatory Visit: Payer: Self-pay | Admitting: *Deleted

## 2018-03-04 MED ORDER — LISINOPRIL 20 MG PO TABS: 20.0000 mg | ORAL_TABLET | Freq: Every day | ORAL | 0 refills | Status: DC

## 2018-03-04 NOTE — Telephone Encounter (Signed)
Patient was advise returned hctz exchanged for lisinopril without.

## 2018-03-14 ENCOUNTER — Ambulatory Visit: Payer: Self-pay | Admitting: *Deleted

## 2018-03-14 NOTE — Telephone Encounter (Signed)
Pt called with complaints of chest pressure/tightness, between her breasts, starting on Wednesday 03/12/18; the pt says that "she was belching a lot" she says that she also had dizziness and an uneasy feeling; today she is having the chest pressure/tightness again today; the pt also says that 3 weeks ago she gets cold and gets extremely hot and she has been through menopause; she goes on to say that today she has had chest pressure again and her head is woozy, sneezing, watery arms, and she has been taking equate loratidine; the pt says she came into the office but was not seen (she was told that they do not take walk-ins); recommendations made per nurse triage protocol; pt instructed to go to urgent care/ED; she verbalizes understanding; attempted to schedule pt for appointment on 03/17/18; spoke with Addie, she informed me that the pt should go to urgent care/ED and once she has been cleared, an appointment can be scheduled; pt given information and she verbalizes understanding; she also says that she is already at urgent care   Reason for Disposition . Chest pain lasts > 5 minutes (Exceptions: chest pain occurring > 3 days ago and now asymptomatic; same as previously diagnosed heartburn and has accompanying sour taste in mouth)  Answer Assessment - Initial Assessment Questions 1. LOCATION: "Where does it hurt?"       Between her breasts  2. RADIATION: "Does the pain go anywhere else?" (e.g., into neck, jaw, arms, back)     no 3. ONSET: "When did the chest pain begin?" (Minutes, hours or days)      03/12/18 then 03/14/18 4. PATTERN "Does the pain come and go, or has it been constant since it started?"  "Does it get worse with exertion?"      Comes and goes 5. DURATION: "How long does it last" (e.g., seconds, minutes, hours)    hours 6. SEVERITY: "How bad is the pain?"  (e.g., Scale 1-10; mild, moderate, or severe)    - MILD (1-3): doesn't interfere with normal activities     - MODERATE (4-7):  interferes with normal activities or awakens from sleep    - SEVERE (8-10): excruciating pain, unable to do any normal activities       modetate 7. CARDIAC RISK FACTORS: "Do you have any history of heart problems or risk factors for heart disease?" (e.g., prior heart attack, angina; high blood pressure, diabetes, being overweight, high cholesterol, smoking, or strong family history of heart disease)     Strong family history, diabetic 8. PULMONARY RISK FACTORS: "Do you have any history of lung disease?"  (e.g., blood clots in lung, asthma, emphysema, birth control pills)     no 9. CAUSE: "What do you think is causing the chest pain?"     Not sure 10. OTHER SYMPTOMS: "Do you have any other symptoms?" (e.g., dizziness, nausea, vomiting, sweating, fever, difficulty breathing, cough)       Dizziness, sinus symptoms 11. PREGNANCY: "Is there any chance you are pregnant?" "When was your last menstrual period?"       no  Protocols used: CHEST PAIN-A-AH

## 2018-03-18 ENCOUNTER — Other Ambulatory Visit: Payer: Self-pay

## 2018-03-18 ENCOUNTER — Encounter: Payer: Self-pay | Admitting: Family Medicine

## 2018-03-18 ENCOUNTER — Ambulatory Visit (INDEPENDENT_AMBULATORY_CARE_PROVIDER_SITE_OTHER): Payer: BLUE CROSS/BLUE SHIELD | Admitting: Family Medicine

## 2018-03-18 ENCOUNTER — Ambulatory Visit: Payer: BLUE CROSS/BLUE SHIELD

## 2018-03-18 VITALS — BP 126/77 | HR 68 | Temp 97.8°F | Resp 18 | Ht 66.0 in | Wt 220.2 lb

## 2018-03-18 DIAGNOSIS — E785 Hyperlipidemia, unspecified: Secondary | ICD-10-CM

## 2018-03-18 DIAGNOSIS — Z23 Encounter for immunization: Secondary | ICD-10-CM | POA: Diagnosis not present

## 2018-03-18 DIAGNOSIS — R9431 Abnormal electrocardiogram [ECG] [EKG]: Secondary | ICD-10-CM | POA: Diagnosis not present

## 2018-03-18 DIAGNOSIS — E1142 Type 2 diabetes mellitus with diabetic polyneuropathy: Secondary | ICD-10-CM

## 2018-03-18 DIAGNOSIS — R072 Precordial pain: Secondary | ICD-10-CM

## 2018-03-18 DIAGNOSIS — Z8249 Family history of ischemic heart disease and other diseases of the circulatory system: Secondary | ICD-10-CM | POA: Diagnosis not present

## 2018-03-18 LAB — COMPREHENSIVE METABOLIC PANEL
ALT: 11 IU/L (ref 0–32)
AST: 15 IU/L (ref 0–40)
Albumin/Globulin Ratio: 2.7 — ABNORMAL HIGH (ref 1.2–2.2)
Albumin: 4.9 g/dL (ref 3.5–5.5)
Alkaline Phosphatase: 49 IU/L (ref 39–117)
BUN/Creatinine Ratio: 17 (ref 9–23)
BUN: 20 mg/dL (ref 6–24)
Bilirubin Total: 0.6 mg/dL (ref 0.0–1.2)
CO2: 25 mmol/L (ref 20–29)
Calcium: 9.5 mg/dL (ref 8.7–10.2)
Chloride: 103 mmol/L (ref 96–106)
Creatinine, Ser: 1.15 mg/dL — ABNORMAL HIGH (ref 0.57–1.00)
GFR calc Af Amer: 61 mL/min/{1.73_m2} (ref >59)
GFR calc non Af Amer: 53 mL/min/{1.73_m2} — ABNORMAL LOW (ref >59)
Globulin, Total: 1.8 g/dL (ref 1.5–4.5)
Glucose: 97 mg/dL (ref 65–99)
Potassium: 4.7 mmol/L (ref 3.5–5.2)
Sodium: 145 mmol/L — ABNORMAL HIGH (ref 134–144)
Total Protein: 6.7 g/dL (ref 6.0–8.5)

## 2018-03-18 LAB — POCT GLYCOSYLATED HEMOGLOBIN (HGB A1C): Hemoglobin A1C: 6.1 % — AB (ref 4.0–5.6)

## 2018-03-18 MED ORDER — OMEPRAZOLE 20 MG PO CPDR: 20.0000 mg | DELAYED_RELEASE_CAPSULE | Freq: Every day | ORAL | 3 refills | Status: AC

## 2018-03-18 NOTE — Addendum Note (Signed)
Addended by: Delia Chimes A on: 03/18/2018 09:19 AM   Modules accepted: Orders

## 2018-03-18 NOTE — Patient Instructions (Signed)
° ° ° °  If you have lab work done today you will be contacted with your lab results within the next 2 weeks.  If you have not heard from us then please contact us. The fastest way to get your results is to register for My Chart. ° ° °IF you received an x-ray today, you will receive an invoice from [facility]. Please contact [facility] at [phone] with questions or concerns regarding your invoice.  ° °IF you received labwork today, you will receive an invoice from [facility]. Please contact [facility] at [phone] with questions or concerns regarding your invoice.  ° °Our billing staff will not be able to assist you with questions regarding bills from these companies. ° °You will be contacted with the lab results as soon as they are available. The fastest way to get your results is to activate your My Chart account. Instructions are located on the last page of this paperwork. If you have not heard from us regarding the results in 2 weeks, please contact this office. °  ° ° ° °
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
## 2018-03-19 LAB — LIPID PANEL
Chol/HDL Ratio: 4.9 ratio — ABNORMAL HIGH (ref 0.0–4.4)
Cholesterol, Total: 180 mg/dL (ref 100–199)
HDL: 37 mg/dL — ABNORMAL LOW (ref >39)
LDL Calculated: 108 mg/dL — ABNORMAL HIGH (ref 0–99)
Triglycerides: 176 mg/dL — ABNORMAL HIGH (ref 0–149)
VLDL Cholesterol Cal: 35 mg/dL (ref 5–40)

## 2018-04-06 ENCOUNTER — Other Ambulatory Visit: Payer: Self-pay | Admitting: Family Medicine

## 2018-04-06 DIAGNOSIS — M62838 Other muscle spasm: Secondary | ICD-10-CM

## 2018-04-07 NOTE — Telephone Encounter (Signed)
Requested medication (s) are due for refill today: yes  Requested medication (s) are on the active medication list: yes    Last refill: 09/06/16  #180  1 refills  Future visit scheduled yes   06/18/17  Dr. Brigitte Pulse  Notes to clinic:not delegated  Requested Prescriptions  Pending Prescriptions Disp Refills   methocarbamol (ROBAXIN) 500 MG tablet [Pharmacy Med Name: [facility] 500MG    TAB] 180 tablet 1    Sig: TAKE 1 TO 2 TABLETS BY MOUTH AT BEDTIME AS NEEDED FOR MUSCLE SPASMS     Not Delegated - Analgesics:  Muscle Relaxants Failed - 04/06/2018 10:12 AM      Failed - This refill cannot be delegated      Passed - Valid encounter within last 6 months    Recent Outpatient Visits          2 weeks ago Substernal chest pain   [facility] at [facility], [place], MD   4 months ago Stage 3 chronic kidney disease [facility])   [facility] at [facility], Laurey Arrow, MD   6 months ago Hyperlipidemia LDL goal <100   [facility] at [facility], Laurey Arrow, MD   8 months ago Acute pain of right knee   [facility] at [facility], Laurey Arrow, MD   8 months ago Sinus pain   Primary Care at [place], [place]      Future Appointments            In 1 week [name], MD Primary Care at [place], [facility]   In 1 month [name], MD Primary Care at [place], [facility]

## 2018-04-10 NOTE — Telephone Encounter (Signed)
Your pt is requesting medication refill. She has a follow up appt scheduled for January

## 2018-04-18 ENCOUNTER — Other Ambulatory Visit: Payer: Self-pay

## 2018-04-18 ENCOUNTER — Encounter: Payer: Self-pay | Admitting: Family Medicine

## 2018-04-18 ENCOUNTER — Ambulatory Visit: Payer: BLUE CROSS/BLUE SHIELD | Admitting: Family Medicine

## 2018-04-18 VITALS — BP 119/75 | HR 76 | Temp 98.0°F | Resp 16 | Wt 218.0 lb

## 2018-04-18 DIAGNOSIS — N183 Chronic kidney disease, stage 3 unspecified: Secondary | ICD-10-CM

## 2018-04-18 DIAGNOSIS — E559 Vitamin D deficiency, unspecified: Secondary | ICD-10-CM

## 2018-04-18 DIAGNOSIS — E1142 Type 2 diabetes mellitus with diabetic polyneuropathy: Secondary | ICD-10-CM

## 2018-04-18 DIAGNOSIS — R072 Precordial pain: Secondary | ICD-10-CM

## 2018-04-18 DIAGNOSIS — E785 Hyperlipidemia, unspecified: Secondary | ICD-10-CM | POA: Diagnosis not present

## 2018-04-18 DIAGNOSIS — E039 Hypothyroidism, unspecified: Secondary | ICD-10-CM

## 2018-04-18 DIAGNOSIS — I10 Essential (primary) hypertension: Secondary | ICD-10-CM

## 2018-04-18 DIAGNOSIS — R9431 Abnormal electrocardiogram [ECG] [EKG]: Secondary | ICD-10-CM

## 2018-04-18 DIAGNOSIS — Z1231 Encounter for screening mammogram for malignant neoplasm of breast: Secondary | ICD-10-CM | POA: Diagnosis not present

## 2018-04-18 LAB — POCT GLYCOSYLATED HEMOGLOBIN (HGB A1C): Hemoglobin A1C: 6.1 % — AB (ref 4.0–5.6)

## 2018-04-18 MED ORDER — ATORVASTATIN CALCIUM 10 MG PO TABS: 10.0000 mg | ORAL_TABLET | Freq: Every day | ORAL | 1 refills | Status: AC

## 2018-04-18 MED ORDER — FAMOTIDINE 20 MG PO TABS: 20.0000 mg | ORAL_TABLET | Freq: Two times a day (BID) | ORAL | 1 refills | Status: AC
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
## 2018-04-18 NOTE — Progress Notes (Signed)
Reorder from 03/18/2018 as appt scheduled but original order expired.

## 2018-04-18 NOTE — Patient Instructions (Addendum)
You need to schedule your mammogram.  Please call [facility] at [phone] to schedule.  If you haven't heard about your stress test in another 2 weeks, please call our office so we can see what was going on - I sent a scheduling message to our referrals staff today to checkin on what is going on.  Add in the famoitidine twice a day to your current regimen to see if we can get those fluttery sxs to go away.  Come back to your next visit FASTING again (SORRY) so we can see how your cholesterol is doing when you are being better about taking the atorvastatin.  Do not use any other otc pain medication other than tylenol/acetaminophen - so no aleve, ibuprofen, motrin, advil, etc.  If you have lab work done today you will be contacted with your lab results within the next 2 weeks.  If you have not heard from [place] then please contact us. The fastest way to get your results is to register for My Chart.   IF you received an x-ray today, you will receive an invoice from [facility]. Please contact [facility] at [phone] with questions or concerns regarding your invoice.   IF you received labwork today, you will receive an invoice from [facility]. Please contact [facility] at [phone] with questions or concerns regarding your invoice.   Our billing staff will not be able to assist you with questions regarding bills from these companies.  You will be contacted with the lab results as soon as they are available. The fastest way to get your results is to activate your My Chart account. Instructions are located on the last page of this paperwork. If you have not heard from [place] regarding the results in 2 weeks, please contact this office.     Chronic Kidney Disease, Adult Chronic kidney disease (CKD) happens when the kidneys are damaged during a time of 3 or more months. The kidneys are two organs that do many important jobs in the body. These jobs  include:  Removing wastes and extra fluids from the blood.  Making hormones that maintain the amount of fluid in your tissues and blood vessels.  Making sure that the body has the right amount of fluids and chemicals.  Most of the time, this condition does not go away, but it can usually be controlled. Steps must be taken to slow down the kidney damage or stop it from getting worse. Otherwise, the kidneys may stop working. Follow these instructions at home:  Follow your diet as told by your doctor. You may need to avoid alcohol, salty foods (sodium), and foods that are high in potassium, calcium, and protein.  Take over-the-counter and prescription medicines only as told by your doctor. Do not take any new medicines unless your doctor says you can do that. These include vitamins and minerals. ? Medicines and nutritional supplements can make kidney damage worse. ? Your doctor may need to change how much medicine you take.  Do not use any tobacco products. These include cigarettes, chewing tobacco, and e-cigarettes. If you need help quitting, ask your doctor.  Keep all follow-up visits as told by your doctor. This is important.  Check your blood pressure. Tell your doctor if there are changes to your blood pressure.  Get to a healthy weight. Stay at that weight. If you need help with this, ask your doctor.  Start or continue an exercise plan. Try to exercise at least 30 minutes a  day, 5 days a week.  Stay up-to-date with your shots (immunizations) as told by your doctor. Contact a doctor if:  Your symptoms get worse.  You have new symptoms. Get help right away if:  You have symptoms of end-stage kidney disease. These include: ? Headaches. ? Skin that is darker or lighter than normal. ? Numbness in your hands or feet. ? Easy bruising. ? Having hiccups often. ? Chest pain. ? Shortness of breath. ? Stopping of menstrual periods in women.  You have a fever.  You are making  very little pee (urine).  You have pain or bleeding when you pee (urinate). This information is not intended to replace advice given to you by your health care provider. Make sure you discuss any questions you have with your health care provider. Document Released: 08/08/2009 Document Revised: 10/20/2015 Document Reviewed: 01/11/2012 Elsevier Interactive Patient Education  2017 [employer].

## 2018-04-19 LAB — BASIC METABOLIC PANEL
BUN/Creatinine Ratio: 16 (ref 9–23)
BUN: 18 mg/dL (ref 6–24)
CO2: 24 mmol/L (ref 20–29)
Calcium: 9.6 mg/dL (ref 8.7–10.2)
Chloride: 102 mmol/L (ref 96–106)
Creatinine, Ser: 1.1 mg/dL — ABNORMAL HIGH (ref 0.57–1.00)
GFR calc Af Amer: 64 mL/min/{1.73_m2} (ref >59)
GFR calc non Af Amer: 56 mL/min/{1.73_m2} — ABNORMAL LOW (ref >59)
Glucose: 94 mg/dL (ref 65–99)
Potassium: 4.9 mmol/L (ref 3.5–5.2)
Sodium: 141 mmol/L (ref 134–144)

## 2018-04-19 LAB — TSH: TSH: 0.205 u[IU]/mL — ABNORMAL LOW (ref 0.450–4.500)

## 2018-04-19 LAB — VITAMIN D 25 HYDROXY (VIT D DEFICIENCY, FRACTURES): Vit D, 25-Hydroxy: 42.7 ng/mL (ref 30.0–100.0)

## 2018-04-22 ENCOUNTER — Telehealth (HOSPITAL_COMMUNITY): Payer: Self-pay | Admitting: *Deleted

## 2018-04-22 NOTE — Telephone Encounter (Signed)
Left message on voicemail in reference to upcoming appointment scheduled for 04/28/18. Phone number given for a call back so details instructions can be given. Connie [name]

## 2018-04-28 ENCOUNTER — Ambulatory Visit (HOSPITAL_COMMUNITY): Payer: BLUE CROSS/BLUE SHIELD | Attending: Cardiovascular Disease

## 2018-04-28 ENCOUNTER — Other Ambulatory Visit: Payer: Self-pay

## 2018-04-28 ENCOUNTER — Ambulatory Visit (HOSPITAL_COMMUNITY): Payer: BLUE CROSS/BLUE SHIELD

## 2018-04-28 DIAGNOSIS — R072 Precordial pain: Secondary | ICD-10-CM | POA: Diagnosis present

## 2018-04-28 DIAGNOSIS — R9431 Abnormal electrocardiogram [ECG] [EKG]: Secondary | ICD-10-CM | POA: Insufficient documentation

## 2018-04-30 ENCOUNTER — Telehealth: Payer: Self-pay | Admitting: Family Medicine

## 2018-04-30 NOTE — Telephone Encounter (Signed)
Copied from [place] [id number]. Topic: General - Inquiry >> Apr 30, 2018 11:55 AM Connie [name] wrote: Reason for CRM: Pt called to find out the status of her stress test. Pt stated she was told she'd be given a call back the evening of the test which was 12.02.2019/ please advise  Pt stated Dr. Bridget Hartshorn was the provider that pushed through the stress test.

## 2018-05-07 ENCOUNTER — Other Ambulatory Visit: Payer: Self-pay

## 2018-05-07 ENCOUNTER — Encounter: Payer: Self-pay | Admitting: Family Medicine

## 2018-05-07 ENCOUNTER — Ambulatory Visit (INDEPENDENT_AMBULATORY_CARE_PROVIDER_SITE_OTHER): Payer: BLUE CROSS/BLUE SHIELD

## 2018-05-07 ENCOUNTER — Ambulatory Visit (INDEPENDENT_AMBULATORY_CARE_PROVIDER_SITE_OTHER): Payer: BLUE CROSS/BLUE SHIELD | Admitting: Family Medicine

## 2018-05-07 VITALS — BP 121/79 | HR 76 | Temp 97.8°F | Resp 18 | Ht 66.93 in | Wt 216.6 lb

## 2018-05-07 DIAGNOSIS — M79671 Pain in right foot: Secondary | ICD-10-CM

## 2018-05-07 DIAGNOSIS — M109 Gout, unspecified: Secondary | ICD-10-CM

## 2018-05-07 DIAGNOSIS — E790 Hyperuricemia without signs of inflammatory arthritis and tophaceous disease: Secondary | ICD-10-CM | POA: Diagnosis not present

## 2018-05-07 MED ORDER — COLCHICINE 0.6 MG PO TABS: ORAL_TABLET | ORAL | 0 refills | Status: AC

## 2018-05-07 MED ORDER — PREDNISONE 20 MG PO TABS: ORAL_TABLET | ORAL | 0 refills | Status: DC

## 2018-05-07 NOTE — Progress Notes (Signed)
Patient ID: Connie [name], female    DOB: 16-Mar-1961  Age: 57 y.o. MRN: [id number]  Chief Complaint  Patient presents with  . 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  She developed right heel pain a week ago.  It is in the lateral aspect of the calcaneus region.  There is no injury.  She does have a history of elevated uric acid and possible gout in her knee previously last year.  She is diabetic.  Only recent medication change was changed nurse from omeprazole to Pepcid.  She lives alone, does not do a lot of regular exercise.  Takes vitamin D for her low vitamin D level.  She has a little chronic depression which we talked about.  Current allergies, medications, problem list, past/family and social histories reviewed.  Objective:  BP 121/79   Pulse 76   Temp 97.8 F (36.6 C) (Oral)   Resp 18   Ht 5' 6.93" (1.7 m)   Wt 216 lb 9.6 oz (98.2 kg)   SpO2 97%   BMI 34.00 kg/m   No major acute distress.  Good pedal pulses.  Moves moves well.  No swelling of the foot or ankle.  No erythema.  She is tender below and lateral to the Achilles, on the upper portion and lateral portion of the os calcis.  Uric acid level last year was 8.4.  X-ray shows heel spur but calcaneus is otherwise normal.  Assessment & Plan:   Assessment: 1. Inflammatory heel pain, right   2. Abnormal blood level of uric acid   3. Acute gout of right foot, unspecified cause       Plan: See instructions  Orders Placed This Encounter  Procedures  . DG Os Calcis Right    Standing Status:   Future    Number of Occurrences:   1    Standing Expiration Date:   05/07/2019    Order Specific Question:   Reason for Exam (SYMPTOM  OR DIAGNOSIS REQUIRED)    Answer:   right heel pain    Order Specific Question:   Is the patient pregnant?    Answer:   No    Order Specific Question:   Preferred imaging location?   Answer:   External  . Sedimentation rate  . Uric acid    Meds ordered this encounter  Medications  . predniSONE (DELTASONE) 20 MG tablet    Sig: Take 3 pills daily for 2 days, then 2 daily for 2 days, then 1 daily for 2 days for gout.    Dispense:  12 tablet    Refill:  0  . colchicine 0.6 MG tablet    Sig: Take 1 twice daily today and tomorrow, then once daily.  For gout.    Dispense:  30 tablet    Refill:  0         Patient Instructions   Take the colchicine 1 twice daily for 2 days, then 1 once daily for gout.  Continue the colchicine for 3 or 4 days after the heel is doing better.  Colchicine can interact with a atorvastatin, so do not take your cholesterol medication for the next few days until your heel pain has calmed down and you have stopped the colchicine.  Take prednisone 20 mg 3 daily for 2 days then 2 daily for 2  days then 1 daily for 2 days.  These will elevate your blood sugar transiently, but should very quickly give you relief from your heel pain.  I think that with your well-controlled diabetes the little bump in your sugar will not be an issue.  Monitor your sugars.  If the heel does not improve over the next 5 to 7 days come back for recheck.  With the results of your uric acid level.  If it is running too high, it may be that you should be on a long-term pill for uric acid lowering to prevent flares of gout.  We will see what the level looks like first.  Follow-up with your primary care doctor in 2-3 months.  Come in sooner if problems.    If you have lab work done today you will be contacted with your lab results within the next 2 weeks.  If you have not heard from [place] then please contact us. The fastest way to get your results is to register for My Chart.   IF you received an x-ray today, you will receive an invoice from [facility]. Please contact [facility] at [phone] with questions or concerns regarding your invoice.   IF  you received labwork today, you will receive an invoice from [facility]. Please contact [facility] at [phone] with questions or concerns regarding your invoice.   Our billing staff will not be able to assist you with questions regarding bills from these companies.  You will be contacted with the lab results as soon as they are available. The fastest way to get your results is to activate your My Chart account. Instructions are located on the last page of this paperwork. If you have not heard from [place] regarding the results in 2 weeks, please contact this office.        Return if symptoms worsen or fail to improve.   Ruben Reason, MD 05/07/2018

## 2018-05-07 NOTE — Patient Instructions (Addendum)
Take the colchicine 1 twice daily for 2 days, then 1 once daily for gout.  Continue the colchicine for 3 or 4 days after the heel is doing better.  Colchicine can interact with a atorvastatin, so do not take your cholesterol medication for the next few days until your heel pain has calmed down and you have stopped the colchicine.  Take prednisone 20 mg 3 daily for 2 days then 2 daily for 2 days then 1 daily for 2 days.  These will elevate your blood sugar transiently, but should very quickly give you relief from your heel pain.  I think that with your well-controlled diabetes the little bump in your sugar will not be an issue.  Monitor your sugars.  If the heel does not improve over the next 5 to 7 days come back for recheck.  With the results of your uric acid level.  If it is running too high, it may be that you should be on a long-term pill for uric acid lowering to prevent flares of gout.  We will see what the level looks like first.  Follow-up with your primary care doctor in 2-3 months.  Come in sooner if problems.    If you have lab work done today you will be contacted with your lab results within the next 2 weeks.  If you have not heard from [place] then please contact us. The fastest way to get your results is to register for My Chart.   IF you received an x-ray today, you will receive an invoice from [facility]. Please contact [facility] at [phone] with questions or concerns regarding your invoice.   IF you received labwork today, you will receive an invoice from [facility]. Please contact [facility] at [phone] with questions or concerns regarding your invoice.   Our billing staff will not be able to assist you with questions regarding bills from these companies.  You will be contacted with the lab results as soon as they are available. The fastest way to get your results is to activate your My Chart account. Instructions are located on the last page of this  paperwork. If you have not heard from [place] regarding the results in 2 weeks, please contact this office.

## 2018-05-08 LAB — URIC ACID: Uric Acid: 8.4 mg/dL — ABNORMAL HIGH (ref 2.5–7.1)

## 2018-05-08 LAB — SEDIMENTATION RATE: Sed Rate: 8 mm/hr (ref 0–40)

## 2018-05-09 ENCOUNTER — Telehealth: Payer: Self-pay | Admitting: Family Medicine

## 2018-05-09 NOTE — Telephone Encounter (Unsigned)
Copied from [place] [id number]. Topic: Quick Communication - Rx Refill/Question >> May 09, 2018  2:34 PM Virl Axe D wrote: Medication: colchicine 0.6 MG tablet  / Pt stated the pharmacy sent over a request for a prior authorization for this medication. Please advise as she has not been able to pick up prescription yet.   Has the patient contacted their pharmacy? Yes.   (Agent: If no, request that the patient contact the pharmacy for the refill.) (Agent: If yes, when and what did the pharmacy advise?)  Preferred Pharmacy (with phone number or street name): [facility] ([address] [place] [phone] (Phone) [phone] (Fax)    Agent: Please be advised that RX refills may take up to 3 business days. We ask that you follow-up with your pharmacy.

## 2018-05-11 MED ORDER — LEVOTHYROXINE SODIUM 125 MCG PO TABS: 125.0000 ug | ORAL_TABLET | Freq: Every day | ORAL | 0 refills | Status: DC

## 2018-05-11 NOTE — Addendum Note (Signed)
Addended by: [name] on: 05/11/2018 09:59 PM   Modules accepted: Orders

## 2018-05-14 NOTE — Telephone Encounter (Signed)
Medication Colchicine was denied by her insurance company. Dr. Linna Darner prescribed the medication on 05/07/2018. Denial letter states that she needs to try Mitigare capsules first.

## 2018-05-16 ENCOUNTER — Other Ambulatory Visit: Payer: Self-pay | Admitting: Family Medicine

## 2018-05-16 NOTE — Telephone Encounter (Signed)
Call patient:  Don't worry about the colchicine now.  The prednisone has had time to calm it down.    She can take Aleve 2 pills twice daily when needed for foot pain.  Begin her on:  Allopurinol 100 #30 One daily RFx3 and have her see Brigitte Pulse back in 3 months, sooner if problems.

## 2018-05-19 ENCOUNTER — Other Ambulatory Visit: Payer: Self-pay

## 2018-05-19 ENCOUNTER — Telehealth: Payer: Self-pay | Admitting: *Deleted

## 2018-05-19 ENCOUNTER — Ambulatory Visit: Payer: BLUE CROSS/BLUE SHIELD | Admitting: Family Medicine

## 2018-05-19 ENCOUNTER — Encounter: Payer: Self-pay | Admitting: Family Medicine

## 2018-05-19 VITALS — BP 116/72 | HR 60 | Temp 98.0°F | Resp 16 | Ht 66.93 in | Wt 218.0 lb

## 2018-05-19 DIAGNOSIS — I10 Essential (primary) hypertension: Secondary | ICD-10-CM

## 2018-05-19 DIAGNOSIS — N183 Chronic kidney disease, stage 3 unspecified: Secondary | ICD-10-CM

## 2018-05-19 DIAGNOSIS — M2550 Pain in unspecified joint: Secondary | ICD-10-CM | POA: Diagnosis not present

## 2018-05-19 DIAGNOSIS — L409 Psoriasis, unspecified: Secondary | ICD-10-CM | POA: Diagnosis not present

## 2018-05-19 DIAGNOSIS — E790 Hyperuricemia without signs of inflammatory arthritis and tophaceous disease: Secondary | ICD-10-CM | POA: Diagnosis not present

## 2018-05-19 DIAGNOSIS — M1990 Unspecified osteoarthritis, unspecified site: Secondary | ICD-10-CM | POA: Diagnosis not present

## 2018-05-19 DIAGNOSIS — E039 Hypothyroidism, unspecified: Secondary | ICD-10-CM

## 2018-05-19 DIAGNOSIS — M109 Gout, unspecified: Secondary | ICD-10-CM

## 2018-05-19 DIAGNOSIS — K760 Fatty (change of) liver, not elsewhere classified: Secondary | ICD-10-CM

## 2018-05-19 DIAGNOSIS — R252 Cramp and spasm: Secondary | ICD-10-CM

## 2018-05-19 MED ORDER — LEVOTHYROXINE SODIUM 125 MCG PO TABS: 125.0000 ug | ORAL_TABLET | Freq: Every day | ORAL | 0 refills | Status: DC

## 2018-05-19 MED ORDER — PREDNISONE 20 MG PO TABS: ORAL_TABLET | ORAL | 0 refills | Status: DC

## 2018-05-19 MED ORDER — ALLOPURINOL 100 MG PO TABS: 100.0000 mg | ORAL_TABLET | Freq: Every day | ORAL | 1 refills | Status: AC

## 2018-05-19 NOTE — Patient Instructions (Addendum)
You can reduce your uric acid level by avoiding red meat, saturated fats (dairy fats, butter fats, animal fats), high fructose corn syrup, and beer and drinking more water.     If you have lab work done today you will be contacted with your lab results within the next 2 weeks.  If you have not heard from [place] then please contact us. The fastest way to get your results is to register for My Chart.   Hold onto the paper prescription for prednisone and fill this immediately if you feel a gout flare starting.  This will be 1 joint becoming intensely painful without injury as well as swollen and hot to the touch and red.  Let us have you see rheumatology to make sure you are not having any underlying psoriatic arthritis complicating the picture that could be treated.  Restart your atorvastatin 10 mg.  Stop the potassium and also continue abstaining from the furosemide.  Start the allopurinol once daily ASAP as this should only be started when you are not having a gout flare.  Recheck in 2 months.  You do not need to be fasting at this visit.  Take your medications and eat normally as you typically do before your visit that day.  If the leg cramps continue, make sure you are drinking plenty of water.  Consider whether he might be low on iron, vitamin E, or vitamin B complex.  Start levothyroxine 125 mcg rather than 137 mcg.  This new dose is waiting at your pharmacy.  Low-Purine Eating Plan A low-purine eating plan involves making food choices to limit your intake of purine. Purine is a kind of uric acid. Too much uric acid in your blood can cause certain conditions, such as gout and kidney stones. Eating a low-purine diet can help control these conditions. What are tips for following this plan? Reading food labels   Avoid foods with saturated or Trans fat.  Check the ingredient list of grains-based foods, such as bread and cereal, to make sure that they contain whole grains.  Check the  ingredient list of sauces or soups to make sure they do not contain meat or fish.  When choosing soft drinks, check the ingredient list to make sure they do not contain high-fructose corn syrup. Shopping  Buy plenty of fresh fruits and vegetables.  Avoid buying canned or fresh fish.  Buy dairy products labeled as low-fat or nonfat.  Avoid buying premade or processed foods. These foods are often high in fat, salt (sodium), and added sugar. Cooking  Use olive oil instead of butter when cooking. Oils like olive oil, canola oil, and sunflower oil contain healthy fats. Meal planning  Learn which foods do or do not affect you. If you find out that a food tends to cause your gout symptoms to flare up, avoid eating that food. You can enjoy foods that do not cause problems. If you have any questions about a food item, talk with your dietitian or health care provider.  Limit foods high in fat, especially saturated fat. Fat makes it harder for your body to get rid of uric acid.  Choose foods that are lower in fat and are lean sources of protein. General guidelines  Limit alcohol intake to no more than 1 drink a day for nonpregnant women and 2 drinks a day for men. One drink equals 12 oz of beer, 5 oz of wine, or 1 oz of hard liquor. Alcohol can affect the way your body  gets rid of uric acid.  Drink plenty of water to keep your urine clear or pale yellow. Fluids can help remove uric acid from your body.  If directed by your health care provider, take a vitamin C supplement.  Work with your health care provider and dietitian to develop a plan to achieve or maintain a healthy weight. Losing weight can help reduce uric acid in your blood. What foods are recommended? The items listed may not be a complete list. Talk with your dietitian about what dietary choices are best for you. Foods low in purines Foods low in purines do not need to be limited. These include:  All fruits.  All low-purine  vegetables, pickles, and olives.  Breads, pasta, rice, cornbread, and popcorn. Cake and other baked goods.  All dairy foods.  Eggs, nuts, and nut butters.  Spices and condiments, such as salt, herbs, and vinegar.  Plant oils, butter, and margarine.  Water, sugar-free soft drinks, tea, coffee, and cocoa.  Vegetable-based soups, broths, sauces, and gravies. Foods moderate in purines Foods moderate in purines should be limited to the amounts listed.   cup of asparagus, cauliflower, spinach, mushrooms, or green peas, each day.  2/3 cup uncooked oatmeal, each day.   cup dry wheat bran or wheat germ, each day.  2-3 ounces of meat or poultry, each day.  4-6 ounces of shellfish, such as crab, lobster, oysters, or shrimp, each day.  1 cup cooked beans, peas, or lentils, each day.  Soup, broths, or bouillon made from meat or fish. Limit these foods as much as possible. What foods are not recommended? The items listed may not be a complete list. Talk with your dietitian about what dietary choices are best for you. Limit your intake of foods high in purines, including:  Beer and other alcohol.  Meat-based gravy or sauce.  Canned or fresh fish, such as: ? Anchovies, sardines, herring, and tuna. ? Mussels and scallops. ? Codfish, trout, and haddock.  Berniece Salines.  Organ meats, such as: ? Liver or kidney. ? Tripe. ? Sweetbreads (thymus gland or pancreas).  [employer].  Yeast or yeast extract supplements.  Drinks sweetened with high-fructose corn syrup. Summary  Eating a low-purine diet can help control conditions caused by too much uric acid in the body, such as gout or kidney stones.  Choose low-purine foods, limit alcohol, and limit foods high in fat.  You will learn over time which foods do or do not affect you. If you find out that a food tends to cause your gout symptoms to flare up, avoid eating that food. This information is not intended to replace advice  given to you by your health care provider. Make sure you discuss any questions you have with your health care provider. Document Released: 09/08/2010 Document Revised: 06/27/2016 Document Reviewed: 06/27/2016 Elsevier Interactive Patient Education  2019 [place].  Gout  Gout is a condition that causes painful swelling of the joints. Gout is a type of inflammation of the joints (arthritis). This condition is caused by having too much uric acid in the body. Uric acid is a chemical that forms when the body breaks down substances called purines. Purines are important for building body proteins. When the body has too much uric acid, sharp crystals can form and build up inside the joints. This causes pain and swelling. Gout attacks can happen quickly and may be very painful (acute gout). Over time, the attacks can affect more joints and become more frequent (  chronic gout). Gout can also cause uric acid to build up under the skin and inside the kidneys. What are the causes? This condition is caused by too much uric acid in your blood. This can happen because:  Your kidneys do not remove enough uric acid from your blood. This is the most common cause.  Your body makes too much uric acid. This can happen with some cancers and cancer treatments. It can also occur if your body is breaking down too many red blood cells (hemolytic anemia).  You eat too many foods that are high in purines. These foods include organ meats and some seafood. Alcohol, especially beer, is also high in purines. A gout attack may be triggered by trauma or stress. What increases the risk? You are more likely to develop this condition if you:  Have a family history of gout.  Are female and middle-aged.  Are female and have gone through menopause.  Are obese.  Frequently drink alcohol, especially beer.  Are dehydrated.  Lose weight too quickly.  Have an organ transplant.  Have lead poisoning.  Take certain  medicines, including aspirin, cyclosporine, diuretics, levodopa, and niacin.  Have kidney disease.  Have a skin condition called psoriasis. What are the signs or symptoms? An attack of acute gout happens quickly. It usually occurs in just one joint. The most common place is the big toe. Attacks often start at night. Other joints that may be affected include joints of the feet, ankle, knee, fingers, wrist, or elbow. Symptoms of this condition may include:  Severe pain.  Warmth.  Swelling.  Stiffness.  Tenderness. The affected joint may be very painful to touch.  Shiny, red, or purple skin.  Chills and fever. Chronic gout may cause symptoms more frequently. More joints may be involved. You may also have white or yellow lumps (tophi) on your hands or feet or in other areas near your joints. How is this diagnosed? This condition is diagnosed based on your symptoms, medical history, and physical exam. You may have tests, such as:  Blood tests to measure uric acid levels.  Removal of joint fluid with a thin needle (aspiration) to look for uric acid crystals.  X-rays to look for joint damage. How is this treated? Treatment for this condition has two phases: treating an acute attack and preventing future attacks. Acute gout treatment may include medicines to reduce pain and swelling, including:  NSAIDs.  Steroids. These are strong anti-inflammatory medicines that can be taken by mouth (orally) or injected into a joint.  Colchicine. This medicine relieves pain and swelling when it is taken soon after an attack. It can be given by mouth or through an IV. Preventive treatment may include:  Daily use of smaller doses of NSAIDs or colchicine.  Use of a medicine that reduces uric acid levels in your blood.  Changes to your diet. You may need to see a dietitian about what to eat and drink to prevent gout. Follow these instructions at home: During a gout attack   If directed, put ice  on the affected area: ? Put ice in a plastic bag. ? Place a towel between your skin and the bag. ? Leave the ice on for 20 minutes, 2-3 times a day.  Raise (elevate) the affected joint above the level of your heart as often as possible.  Rest the joint as much as possible. If the affected joint is in your leg, you may be given crutches to use.  Follow instructions  from your health care provider about eating or drinking restrictions. Avoiding future gout attacks  Follow a low-purine diet as told by your dietitian or health care provider. Avoid foods and drinks that are high in purines, including liver, kidney, anchovies, asparagus, herring, mushrooms, mussels, and beer.  Maintain a healthy weight or lose weight if you are overweight. If you want to lose weight, talk with your health care provider. It is important that you do not lose weight too quickly.  Start or maintain an exercise program as told by your health care provider. Eating and drinking  Drink enough fluids to keep your urine pale yellow.  If you drink alcohol: ? Limit how much you use to:  0-1 drink a day for women.  0-2 drinks a day for men. ? Be aware of how much alcohol is in your drink. In the [place] one drink equals one 12 oz bottle of beer (355 mL) one 5 oz glass of wine (148 mL), or one 1 oz glass of hard liquor (44 mL). General instructions  Take over-the-counter and prescription medicines only as told by your health care provider.  Do not drive or use heavy machinery while taking prescription pain medicine.  Return to your normal activities as told by your health care provider. Ask your health care provider what activities are safe for you.  Keep all follow-up visits as told by your health care provider. This is important. Contact a health care provider if you have:  Another gout attack.  Continuing symptoms of a gout attack after 10 days of treatment.  Side effects from your medicines.  Chills or a  fever.  Burning pain when you urinate.  Pain in your lower back or belly. Get help right away if you:  Have severe or uncontrolled pain.  Cannot urinate. Summary  Gout is painful swelling of the joints caused by inflammation.  The most common site of pain is the big toe, but it can affect other joints in the body.  Medicines and dietary changes can help to prevent and treat gout attacks. This information is not intended to replace advice given to you by your health care provider. Make sure you discuss any questions you have with your health care provider. Document Released: 05/11/2000 Document Revised: 12/04/2017 Document Reviewed: 12/04/2017 Elsevier Interactive Patient Education  2019 [employer].   IF you received an x-ray today, you will receive an invoice from [facility]. Please contact [facility] at [phone] with questions or concerns regarding your invoice.   IF you received labwork today, you will receive an invoice from [facility]. Please contact [facility] at [phone] with questions or concerns regarding your invoice.   Our billing staff will not be able to assist you with questions regarding bills from these companies.  You will be contacted with the lab results as soon as they are available. The fastest way to get your results is to activate your My Chart account. Instructions are located on the last page of this paperwork. If you have not heard from [place] regarding the results in 2 weeks, please contact this office.

## 2018-05-19 NOTE — Telephone Encounter (Signed)
Left message on pt's voicemail to return call to the office. If patient calls back it is regarding message about colchicine per Dr. Clayborn Heron note.
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
## 2018-05-20 LAB — PHOSPHORUS: Phosphorus: 4 mg/dL (ref 2.5–4.5)

## 2018-05-20 LAB — COMPREHENSIVE METABOLIC PANEL
ALT: 9 IU/L (ref 0–32)
AST: 12 IU/L (ref 0–40)
Albumin/Globulin Ratio: 2.2 (ref 1.2–2.2)
Albumin: 4.4 g/dL (ref 3.5–5.5)
Alkaline Phosphatase: 54 IU/L (ref 39–117)
BUN/Creatinine Ratio: 17 (ref 9–23)
BUN: 19 mg/dL (ref 6–24)
Bilirubin Total: 0.6 mg/dL (ref 0.0–1.2)
CO2: 21 mmol/L (ref 20–29)
Calcium: 9.6 mg/dL (ref 8.7–10.2)
Chloride: 103 mmol/L (ref 96–106)
Creatinine, Ser: 1.15 mg/dL — ABNORMAL HIGH (ref 0.57–1.00)
GFR calc Af Amer: 61 mL/min/{1.73_m2} (ref >59)
GFR calc non Af Amer: 53 mL/min/{1.73_m2} — ABNORMAL LOW (ref >59)
Globulin, Total: 2 g/dL (ref 1.5–4.5)
Glucose: 99 mg/dL (ref 65–99)
Potassium: 4.9 mmol/L (ref 3.5–5.2)
Sodium: 141 mmol/L (ref 134–144)
Total Protein: 6.4 g/dL (ref 6.0–8.5)

## 2018-05-20 LAB — ANA W/REFLEX IF POSITIVE: Anti Nuclear Antibody(ANA): NEGATIVE

## 2018-05-20 LAB — CK: Total CK: 53 U/L (ref 24–173)

## 2018-05-20 LAB — SEDIMENTATION RATE: Sed Rate: 3 mm/hr (ref 0–40)

## 2018-05-20 LAB — C-REACTIVE PROTEIN: CRP: 9 mg/L (ref 0–10)

## 2018-05-20 LAB — MAGNESIUM: Magnesium: 2.4 mg/dL — ABNORMAL HIGH (ref 1.6–2.3)

## 2018-05-30 ENCOUNTER — Ambulatory Visit: Payer: Self-pay

## 2018-06-28 ENCOUNTER — Other Ambulatory Visit: Payer: Self-pay | Admitting: *Deleted

## 2018-06-28 ENCOUNTER — Other Ambulatory Visit: Payer: Self-pay | Admitting: Family Medicine

## 2018-06-28 NOTE — Telephone Encounter (Signed)
Patient is wanting a refill of her medication lisinopril 20 mg.

## 2018-07-08 ENCOUNTER — Telehealth: Payer: Self-pay | Admitting: Family Medicine

## 2018-07-08 NOTE — Telephone Encounter (Signed)
Spoke with pt regarding appt scheduled for 2/15 with Dr. Brigitte Pulse. Due to Dr. Brigitte Pulse being on leave, pt will need to be rescheduled. I was able to get pt rescheduled for 3/10 at 8:30 am. I advised pt of time, building number and late policy. Pt acknowledged.

## 2018-07-14 ENCOUNTER — Ambulatory Visit: Payer: BLUE CROSS/BLUE SHIELD | Admitting: Family Medicine

## 2018-07-25 ENCOUNTER — Telehealth: Payer: Self-pay | Admitting: Family Medicine

## 2018-07-25 ENCOUNTER — Other Ambulatory Visit: Payer: Self-pay | Admitting: Family Medicine

## 2018-07-25 NOTE — Telephone Encounter (Signed)
*  SHAW* Called pt. To notify her of Dr. Raul Del imminent departure. Pt. Wished to request refills on metaformin and her bp medication for at least one month's supply to give her time to find a new Dr. Either with the practice or without.

## 2018-07-28 ENCOUNTER — Other Ambulatory Visit: Payer: Self-pay | Admitting: *Deleted

## 2018-07-28 MED ORDER — METFORMIN HCL 500 MG PO TABS: 500.0000 mg | ORAL_TABLET | Freq: Every day | ORAL | 0 refills | Status: AC

## 2018-08-05 ENCOUNTER — Ambulatory Visit: Payer: BLUE CROSS/BLUE SHIELD | Admitting: Family Medicine

## 2018-09-20 ENCOUNTER — Other Ambulatory Visit: Payer: Self-pay | Admitting: Family Medicine

## 2018-10-29 ENCOUNTER — Other Ambulatory Visit: Payer: Self-pay | Admitting: Family Medicine

## 2018-10-29 DIAGNOSIS — I10 Essential (primary) hypertension: Secondary | ICD-10-CM

## 2018-10-29 MED ORDER — LISINOPRIL 20 MG PO TABS: 20.0000 mg | ORAL_TABLET | Freq: Every day | ORAL | 0 refills | Status: AC

## 2018-10-29 NOTE — Telephone Encounter (Signed)
Courtesy refill sent. Will need office visit for additional refills

## 2018-10-29 NOTE — Telephone Encounter (Signed)
Copied from [place] [phone]. Topic: General - Other >> Oct 29, 2018  7:22 AM Lennox Solders wrote: Reason for CRM: pt is calling and needs a refill on lisinopril. Pt is aware it has been about 6 month since she last seen dr Brigitte Pulse. [facility] has [facility] and we are not in network. Pt will get a new provider. Pt would like 30 day supply. [facility] [place]

## 2018-11-13 ENCOUNTER — Other Ambulatory Visit: Payer: Self-pay

## 2018-11-13 ENCOUNTER — Telehealth: Payer: Self-pay | Admitting: Family Medicine

## 2018-11-13 DIAGNOSIS — E039 Hypothyroidism, unspecified: Secondary | ICD-10-CM

## 2018-11-13 MED ORDER — LEVOTHYROXINE SODIUM 125 MCG PO TABS: 125.0000 ug | ORAL_TABLET | Freq: Every day | ORAL | 0 refills | Status: AC

## 2018-11-13 NOTE — Telephone Encounter (Signed)
Pt would like a refill on her levothyroxine (SYNTHROID, LEVOTHROID) 125 MCG tablet [[id number]]. She has [facility] and we can not see her. She is transferring to a new doctor soon. Pharmacy:  [facility] [place] [address]. Please advise at [phone]

## 2018-11-13 NOTE — Telephone Encounter (Signed)
RX sent to pharmacy  

## 2020-04-29 ENCOUNTER — Other Ambulatory Visit: Payer: Self-pay

## 2020-04-29 ENCOUNTER — Emergency Department (HOSPITAL_COMMUNITY)
Admission: EM | Admit: 2020-04-29 | Discharge: 2020-04-30 | Disposition: A | Discharge status: Home / Self Care | Payer: No Typology Code available for payment source | Attending: Emergency Medicine | Admitting: Emergency Medicine

## 2020-04-29 ENCOUNTER — Encounter (HOSPITAL_COMMUNITY): Payer: Self-pay

## 2020-04-29 DIAGNOSIS — E039 Hypothyroidism, unspecified: Secondary | ICD-10-CM | POA: Diagnosis not present

## 2020-04-29 DIAGNOSIS — I10 Essential (primary) hypertension: Secondary | ICD-10-CM

## 2020-04-29 DIAGNOSIS — E119 Type 2 diabetes mellitus without complications: Secondary | ICD-10-CM | POA: Insufficient documentation

## 2020-04-29 DIAGNOSIS — Z79899 Other long term (current) drug therapy: Secondary | ICD-10-CM | POA: Diagnosis not present

## 2020-04-29 NOTE — ED Triage Notes (Signed)
Pt reports HTN over the past few days. Pt denies dizziness, headache, weakness, and chest pain. Pt has hx of HTN and takes medications for it.
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
## 2023-10-11 ENCOUNTER — Other Ambulatory Visit: Payer: Self-pay | Admitting: Physician Assistant

## 2023-10-11 DIAGNOSIS — R109 Unspecified abdominal pain: Secondary | ICD-10-CM

## 2023-10-24 ENCOUNTER — Encounter: Payer: Self-pay | Admitting: Physician Assistant

## 2023-10-25 ENCOUNTER — Ambulatory Visit
Admission: RE | Admit: 2023-10-25 | Discharge: 2023-10-25 | Disposition: A | Discharge status: Home / Self Care | Source: Ambulatory Visit | Attending: Physician Assistant | Admitting: Physician Assistant

## 2023-10-25 DIAGNOSIS — R109 Unspecified abdominal pain: Secondary | ICD-10-CM
